# Patient Record
Sex: Female | Born: 1950
Health system: Southern US, Community
[De-identification: ages and names within clinical notes are randomized; demographics above are authoritative.]

## PROBLEM LIST (undated history)

## (undated) DIAGNOSIS — R0600 Dyspnea, unspecified: Secondary | ICD-10-CM

## (undated) DIAGNOSIS — I10 Essential (primary) hypertension: Secondary | ICD-10-CM

## (undated) DIAGNOSIS — K219 Gastro-esophageal reflux disease without esophagitis: Secondary | ICD-10-CM

## (undated) DIAGNOSIS — M1990 Unspecified osteoarthritis, unspecified site: Secondary | ICD-10-CM

## (undated) DIAGNOSIS — F329 Major depressive disorder, single episode, unspecified: Secondary | ICD-10-CM

## (undated) DIAGNOSIS — F419 Anxiety disorder, unspecified: Secondary | ICD-10-CM

## (undated) DIAGNOSIS — F32A Depression, unspecified: Secondary | ICD-10-CM

## (undated) DIAGNOSIS — G8929 Other chronic pain: Secondary | ICD-10-CM

## (undated) DIAGNOSIS — J189 Pneumonia, unspecified organism: Secondary | ICD-10-CM

## (undated) DIAGNOSIS — E039 Hypothyroidism, unspecified: Secondary | ICD-10-CM

## (undated) DIAGNOSIS — R51 Headache: Secondary | ICD-10-CM

## (undated) HISTORY — PX: ACHILLES TENDON REPAIR: SUR1153

## (undated) HISTORY — PX: HERNIA REPAIR: SHX51

## (undated) HISTORY — PX: OTHER SURGICAL HISTORY: SHX169

## (undated) HISTORY — DX: Anxiety disorder, unspecified: F41.9

## (undated) HISTORY — DX: Dyspnea, unspecified: R06.00

## (undated) HISTORY — DX: Unspecified osteoarthritis, unspecified site: M19.90

## (undated) HISTORY — PX: LUMBAR FUSION: SHX111

## (undated) HISTORY — DX: Hypothyroidism, unspecified: E03.9

## (undated) HISTORY — DX: Headache: R51

## (undated) HISTORY — DX: Depression, unspecified: F32.A

## (undated) HISTORY — DX: Major depressive disorder, single episode, unspecified: F32.9

## (undated) HISTORY — PX: TUBAL LIGATION: SHX77

## (undated) HISTORY — PX: LUMBAR LAMINECTOMY: SHX95

## (undated) HISTORY — DX: Essential (primary) hypertension: I10

## (undated) HISTORY — DX: Pneumonia, unspecified organism: J18.9

---

## 1997-08-24 ENCOUNTER — Inpatient Hospital Stay (HOSPITAL_COMMUNITY): Admission: AD | Admit: 1997-08-24 | Discharge: 1997-08-25 | Payer: Self-pay | Admitting: *Deleted

## 1997-12-20 ENCOUNTER — Ambulatory Visit (HOSPITAL_COMMUNITY): Admission: RE | Admit: 1997-12-20 | Discharge: 1997-12-20 | Payer: Self-pay | Admitting: *Deleted

## 1997-12-20 ENCOUNTER — Encounter: Payer: Self-pay | Admitting: *Deleted

## 1998-01-10 ENCOUNTER — Encounter: Payer: Self-pay | Admitting: *Deleted

## 1998-01-10 ENCOUNTER — Ambulatory Visit (HOSPITAL_COMMUNITY): Admission: RE | Admit: 1998-01-10 | Discharge: 1998-01-10 | Payer: Self-pay | Admitting: *Deleted

## 1998-02-01 ENCOUNTER — Encounter: Payer: Self-pay | Admitting: *Deleted

## 1998-02-01 ENCOUNTER — Ambulatory Visit (HOSPITAL_COMMUNITY): Admission: RE | Admit: 1998-02-01 | Discharge: 1998-02-01 | Payer: Self-pay | Admitting: *Deleted

## 1998-03-09 ENCOUNTER — Ambulatory Visit (HOSPITAL_COMMUNITY): Admission: RE | Admit: 1998-03-09 | Discharge: 1998-03-09 | Payer: Self-pay | Admitting: *Deleted

## 1998-03-09 ENCOUNTER — Encounter: Payer: Self-pay | Admitting: *Deleted

## 1998-06-09 ENCOUNTER — Encounter: Payer: Self-pay | Admitting: *Deleted

## 1998-12-25 ENCOUNTER — Encounter: Payer: Self-pay | Admitting: Specialist

## 1998-12-28 ENCOUNTER — Inpatient Hospital Stay (HOSPITAL_COMMUNITY): Admission: RE | Admit: 1998-12-28 | Discharge: 1998-12-29 | Payer: Self-pay | Admitting: Specialist

## 1999-02-01 ENCOUNTER — Other Ambulatory Visit: Admission: RE | Admit: 1999-02-01 | Discharge: 1999-02-01 | Payer: Self-pay | Admitting: *Deleted

## 1999-02-14 ENCOUNTER — Encounter: Payer: Self-pay | Admitting: Specialist

## 1999-02-14 ENCOUNTER — Ambulatory Visit (HOSPITAL_COMMUNITY): Admission: RE | Admit: 1999-02-14 | Discharge: 1999-02-14 | Payer: Self-pay | Admitting: Specialist

## 1999-02-22 ENCOUNTER — Encounter: Payer: Self-pay | Admitting: Specialist

## 1999-02-22 ENCOUNTER — Encounter: Admission: RE | Admit: 1999-02-22 | Discharge: 1999-02-22 | Payer: Self-pay | Admitting: Specialist

## 1999-03-05 ENCOUNTER — Encounter: Admission: RE | Admit: 1999-03-05 | Discharge: 1999-03-05 | Payer: Self-pay | Admitting: Family Medicine

## 1999-03-05 ENCOUNTER — Encounter: Payer: Self-pay | Admitting: Family Medicine

## 2002-05-28 ENCOUNTER — Encounter: Payer: Self-pay | Admitting: Neurosurgery

## 2002-05-28 ENCOUNTER — Encounter: Admission: RE | Admit: 2002-05-28 | Discharge: 2002-05-28 | Payer: Self-pay | Admitting: Neurosurgery

## 2002-06-30 ENCOUNTER — Encounter: Payer: Self-pay | Admitting: Neurosurgery

## 2002-07-02 ENCOUNTER — Inpatient Hospital Stay (HOSPITAL_COMMUNITY): Admission: RE | Admit: 2002-07-02 | Discharge: 2002-07-06 | Payer: Self-pay | Admitting: Neurosurgery

## 2002-07-02 ENCOUNTER — Encounter: Payer: Self-pay | Admitting: Neurosurgery

## 2003-04-05 ENCOUNTER — Ambulatory Visit (HOSPITAL_COMMUNITY): Admission: RE | Admit: 2003-04-05 | Discharge: 2003-04-05 | Payer: Self-pay | Admitting: Neurosurgery

## 2003-04-12 ENCOUNTER — Ambulatory Visit (HOSPITAL_COMMUNITY): Admission: RE | Admit: 2003-04-12 | Discharge: 2003-04-13 | Payer: Self-pay | Admitting: Neurosurgery

## 2003-09-14 ENCOUNTER — Encounter: Payer: Self-pay | Admitting: Neurosurgery

## 2003-09-14 ENCOUNTER — Ambulatory Visit (HOSPITAL_COMMUNITY): Admission: RE | Admit: 2003-09-14 | Discharge: 2003-09-15 | Payer: Self-pay | Admitting: Neurosurgery

## 2003-12-02 ENCOUNTER — Ambulatory Visit (HOSPITAL_COMMUNITY): Admission: RE | Admit: 2003-12-02 | Discharge: 2003-12-02 | Payer: Self-pay | Admitting: Neurosurgery

## 2006-08-01 ENCOUNTER — Encounter: Admission: RE | Admit: 2006-08-01 | Discharge: 2006-08-01 | Payer: Self-pay | Admitting: Neurosurgery

## 2006-08-21 ENCOUNTER — Ambulatory Visit (HOSPITAL_BASED_OUTPATIENT_CLINIC_OR_DEPARTMENT_OTHER): Admission: RE | Admit: 2006-08-21 | Discharge: 2006-08-21 | Payer: Self-pay | Admitting: Orthopedic Surgery

## 2006-08-21 ENCOUNTER — Encounter (INDEPENDENT_AMBULATORY_CARE_PROVIDER_SITE_OTHER): Payer: Self-pay | Admitting: Specialist

## 2007-01-19 ENCOUNTER — Encounter: Admission: RE | Admit: 2007-01-19 | Discharge: 2007-01-19 | Payer: Self-pay | Admitting: Neurosurgery

## 2008-01-11 ENCOUNTER — Encounter: Payer: Self-pay | Admitting: Internal Medicine

## 2008-01-12 ENCOUNTER — Encounter: Payer: Self-pay | Admitting: Pulmonary Disease

## 2008-01-21 ENCOUNTER — Encounter: Payer: Self-pay | Admitting: Pulmonary Disease

## 2008-02-25 ENCOUNTER — Ambulatory Visit: Payer: Self-pay | Admitting: Internal Medicine

## 2008-02-25 DIAGNOSIS — J189 Pneumonia, unspecified organism: Secondary | ICD-10-CM

## 2008-02-26 DIAGNOSIS — M1990 Unspecified osteoarthritis, unspecified site: Secondary | ICD-10-CM | POA: Insufficient documentation

## 2008-02-26 DIAGNOSIS — K469 Unspecified abdominal hernia without obstruction or gangrene: Secondary | ICD-10-CM | POA: Insufficient documentation

## 2008-02-26 DIAGNOSIS — E039 Hypothyroidism, unspecified: Secondary | ICD-10-CM | POA: Insufficient documentation

## 2008-02-26 DIAGNOSIS — G894 Chronic pain syndrome: Secondary | ICD-10-CM | POA: Insufficient documentation

## 2008-02-26 DIAGNOSIS — Z9889 Other specified postprocedural states: Secondary | ICD-10-CM

## 2008-02-26 DIAGNOSIS — Z87891 Personal history of nicotine dependence: Secondary | ICD-10-CM | POA: Insufficient documentation

## 2008-02-26 DIAGNOSIS — R51 Headache: Secondary | ICD-10-CM

## 2008-02-26 DIAGNOSIS — I10 Essential (primary) hypertension: Secondary | ICD-10-CM | POA: Insufficient documentation

## 2008-02-26 DIAGNOSIS — F329 Major depressive disorder, single episode, unspecified: Secondary | ICD-10-CM | POA: Insufficient documentation

## 2008-02-26 DIAGNOSIS — F411 Generalized anxiety disorder: Secondary | ICD-10-CM | POA: Insufficient documentation

## 2008-02-26 DIAGNOSIS — R32 Unspecified urinary incontinence: Secondary | ICD-10-CM

## 2008-02-26 DIAGNOSIS — R197 Diarrhea, unspecified: Secondary | ICD-10-CM | POA: Insufficient documentation

## 2008-02-26 DIAGNOSIS — N390 Urinary tract infection, site not specified: Secondary | ICD-10-CM | POA: Insufficient documentation

## 2008-02-26 DIAGNOSIS — R519 Headache, unspecified: Secondary | ICD-10-CM | POA: Insufficient documentation

## 2008-02-29 ENCOUNTER — Telehealth (INDEPENDENT_AMBULATORY_CARE_PROVIDER_SITE_OTHER): Payer: Self-pay | Admitting: *Deleted

## 2008-04-25 ENCOUNTER — Encounter: Payer: Self-pay | Admitting: Critical Care Medicine

## 2008-05-02 ENCOUNTER — Encounter: Payer: Self-pay | Admitting: Internal Medicine

## 2008-05-12 ENCOUNTER — Telehealth (INDEPENDENT_AMBULATORY_CARE_PROVIDER_SITE_OTHER): Payer: Self-pay | Admitting: *Deleted

## 2008-05-16 ENCOUNTER — Ambulatory Visit: Payer: Self-pay | Admitting: Internal Medicine

## 2008-05-16 DIAGNOSIS — J82 Pulmonary eosinophilia, not elsewhere classified: Secondary | ICD-10-CM

## 2008-05-16 DIAGNOSIS — R05 Cough: Secondary | ICD-10-CM

## 2008-05-17 ENCOUNTER — Ambulatory Visit: Payer: Self-pay | Admitting: Internal Medicine

## 2008-05-17 ENCOUNTER — Telehealth (INDEPENDENT_AMBULATORY_CARE_PROVIDER_SITE_OTHER): Payer: Self-pay | Admitting: *Deleted

## 2008-06-13 ENCOUNTER — Ambulatory Visit: Payer: Self-pay | Admitting: Internal Medicine

## 2008-06-15 ENCOUNTER — Ambulatory Visit: Payer: Self-pay | Admitting: Internal Medicine

## 2008-06-16 ENCOUNTER — Telehealth (INDEPENDENT_AMBULATORY_CARE_PROVIDER_SITE_OTHER): Payer: Self-pay | Admitting: *Deleted

## 2008-06-30 ENCOUNTER — Ambulatory Visit: Payer: Self-pay | Admitting: Internal Medicine

## 2008-07-01 LAB — CONVERTED CEMR LAB
ALT: 13 units/L (ref 0–35)
AST: 20 units/L (ref 0–37)
Albumin: 4.5 g/dL (ref 3.5–5.2)
Alkaline Phosphatase: 59 units/L (ref 39–117)
BUN: 9 mg/dL (ref 6–23)
Basophils Relative: 0.1 % (ref 0.0–3.0)
CO2: 33 meq/L — ABNORMAL HIGH (ref 19–32)
Chloride: 100 meq/L (ref 96–112)
Creatinine, Ser: 0.6 mg/dL (ref 0.4–1.2)
Eosinophils Absolute: 0.3 10*3/uL (ref 0.0–0.7)
Eosinophils Relative: 4.4 % (ref 0.0–5.0)
GFR calc non Af Amer: 110 mL/min
Hemoglobin, Urine: NEGATIVE
Leukocytes, UA: NEGATIVE
Lymphocytes Relative: 19.7 % (ref 12.0–46.0)
MCV: 95.4 fL (ref 78.0–100.0)
Neutrophils Relative %: 68.5 % (ref 43.0–77.0)
Nitrite: NEGATIVE
Platelets: 283 10*3/uL (ref 150–400)
Potassium: 4.6 meq/L (ref 3.5–5.1)
RBC: 4.05 M/uL (ref 3.87–5.11)
Specific Gravity, Urine: <=1.005 (ref 1.000–1.035)
Total Bilirubin: 0.8 mg/dL (ref 0.3–1.2)
Urobilinogen, UA: 0.2 (ref 0.0–1.0)
WBC: 6.9 10*3/uL (ref 4.5–10.5)

## 2008-07-13 ENCOUNTER — Telehealth (INDEPENDENT_AMBULATORY_CARE_PROVIDER_SITE_OTHER): Payer: Self-pay | Admitting: *Deleted

## 2008-07-15 ENCOUNTER — Ambulatory Visit: Payer: Self-pay | Admitting: Cardiology

## 2008-07-15 ENCOUNTER — Ambulatory Visit: Payer: Self-pay | Admitting: Internal Medicine

## 2008-10-17 ENCOUNTER — Ambulatory Visit: Payer: Self-pay | Admitting: Internal Medicine

## 2008-11-04 ENCOUNTER — Ambulatory Visit: Payer: Self-pay | Admitting: Internal Medicine

## 2009-01-04 ENCOUNTER — Encounter: Payer: Self-pay | Admitting: Internal Medicine

## 2009-02-03 ENCOUNTER — Ambulatory Visit: Payer: Self-pay | Admitting: Critical Care Medicine

## 2009-02-03 DIAGNOSIS — J209 Acute bronchitis, unspecified: Secondary | ICD-10-CM | POA: Insufficient documentation

## 2009-02-03 LAB — CONVERTED CEMR LAB
Basophils Absolute: 0.1 10*3/uL (ref 0.0–0.1)
Basophils Relative: 0.9 % (ref 0.0–3.0)
Eosinophils Absolute: 0.1 10*3/uL (ref 0.0–0.7)
Hemoglobin: 12.4 g/dL (ref 12.0–15.0)
Lymphs Abs: 0.9 10*3/uL (ref 0.7–4.0)
MCHC: 35 g/dL (ref 30.0–36.0)
MCV: 97.4 fL (ref 78.0–100.0)
Monocytes Absolute: 0.6 10*3/uL (ref 0.1–1.0)
Neutro Abs: 10.5 10*3/uL — ABNORMAL HIGH (ref 1.4–7.7)
RBC: 3.64 M/uL — ABNORMAL LOW (ref 3.87–5.11)
RDW: 11.4 % — ABNORMAL LOW (ref 11.5–14.6)

## 2009-02-06 ENCOUNTER — Telehealth: Payer: Self-pay | Admitting: Critical Care Medicine

## 2009-02-08 ENCOUNTER — Ambulatory Visit: Payer: Self-pay | Admitting: Internal Medicine

## 2009-02-22 ENCOUNTER — Ambulatory Visit: Payer: Self-pay | Admitting: Pulmonary Disease

## 2009-02-22 LAB — CONVERTED CEMR LAB
BUN: 11 mg/dL (ref 6–23)
Chloride: 94 meq/L — ABNORMAL LOW (ref 96–112)
Creatinine, Ser: 0.8 mg/dL (ref 0.4–1.2)
GFR calc non Af Amer: 78.62 mL/min (ref >60)
Glucose, Bld: 90 mg/dL (ref 70–99)
Rhuematoid fact SerPl-aCnc: 22.2 intl units/mL — ABNORMAL HIGH (ref 0.0–20.0)

## 2009-02-28 ENCOUNTER — Encounter: Payer: Self-pay | Admitting: Pulmonary Disease

## 2009-02-28 ENCOUNTER — Ambulatory Visit (HOSPITAL_COMMUNITY): Admission: RE | Admit: 2009-02-28 | Discharge: 2009-02-28 | Payer: Self-pay | Admitting: Pulmonary Disease

## 2009-03-02 ENCOUNTER — Ambulatory Visit: Payer: Self-pay | Admitting: Internal Medicine

## 2009-03-08 ENCOUNTER — Telehealth (INDEPENDENT_AMBULATORY_CARE_PROVIDER_SITE_OTHER): Payer: Self-pay | Admitting: *Deleted

## 2009-03-08 LAB — CONVERTED CEMR LAB
Anti Nuclear Antibody(ANA): NEGATIVE
IgG (Immunoglobin G), Serum: 1010 mg/dL (ref 694–1618)

## 2009-03-21 ENCOUNTER — Ambulatory Visit: Payer: Self-pay | Admitting: Pulmonary Disease

## 2009-03-24 ENCOUNTER — Ambulatory Visit: Admission: RE | Admit: 2009-03-24 | Discharge: 2009-03-24 | Payer: Self-pay | Admitting: Pulmonary Disease

## 2009-03-24 ENCOUNTER — Ambulatory Visit: Payer: Self-pay | Admitting: Pulmonary Disease

## 2009-04-05 ENCOUNTER — Ambulatory Visit: Payer: Self-pay | Admitting: Pulmonary Disease

## 2009-04-18 ENCOUNTER — Ambulatory Visit: Payer: Self-pay | Admitting: Infectious Disease

## 2009-04-18 DIAGNOSIS — J329 Chronic sinusitis, unspecified: Secondary | ICD-10-CM | POA: Insufficient documentation

## 2009-04-18 LAB — CONVERTED CEMR LAB
Albumin: 3.9 g/dL (ref 3.5–5.2)
Alkaline Phosphatase: 83 units/L (ref 39–117)
BUN: 18 mg/dL (ref 6–23)
CO2: 28 meq/L (ref 19–32)
Calcium: 9.1 mg/dL (ref 8.4–10.5)
Chloride: 99 meq/L (ref 96–112)
Eosinophils Absolute: 0.3 10*3/uL (ref 0.0–0.7)
Eosinophils Relative: 5 % (ref 0–5)
Glucose, Bld: 72 mg/dL (ref 70–99)
HCT: 34.5 % — ABNORMAL LOW (ref 36.0–46.0)
Hemoglobin: 12.1 g/dL (ref 12.0–15.0)
IgA: 99 mg/dL (ref 68–378)
IgE (Immunoglobulin E), Serum: 206.2 intl units/mL — ABNORMAL HIGH (ref 0.0–180.0)
IgG (Immunoglobin G), Serum: 1100 mg/dL (ref 694–1618)
Lymphocytes Relative: 23 % (ref 12–46)
Lymphs Abs: 1.4 10*3/uL (ref 0.7–4.0)
MCV: 96 fL (ref >=78.0)
Monocytes Absolute: 0.6 10*3/uL (ref 0.1–1.0)
Platelets: 304 10*3/uL (ref 150–400)
Potassium: 5.5 meq/L — ABNORMAL HIGH (ref 3.5–5.3)
Sodium: 134 meq/L — ABNORMAL LOW (ref 135–145)
Total Protein: 6.2 g/dL (ref 6.0–8.3)
WBC: 6 10*3/uL (ref 4.0–10.5)

## 2009-04-26 ENCOUNTER — Encounter: Payer: Self-pay | Admitting: Infectious Disease

## 2009-04-27 ENCOUNTER — Telehealth: Payer: Self-pay | Admitting: Pulmonary Disease

## 2009-05-22 ENCOUNTER — Ambulatory Visit: Payer: Self-pay | Admitting: Infectious Disease

## 2009-05-23 ENCOUNTER — Encounter: Payer: Self-pay | Admitting: Infectious Disease

## 2009-06-05 ENCOUNTER — Ambulatory Visit: Payer: Self-pay | Admitting: Pulmonary Disease

## 2009-06-14 ENCOUNTER — Encounter: Payer: Self-pay | Admitting: Pulmonary Disease

## 2009-06-14 ENCOUNTER — Ambulatory Visit: Payer: Self-pay | Admitting: Pulmonary Disease

## 2009-06-29 ENCOUNTER — Ambulatory Visit: Payer: Self-pay | Admitting: Infectious Disease

## 2009-08-01 ENCOUNTER — Telehealth: Payer: Self-pay | Admitting: Infectious Disease

## 2009-08-02 ENCOUNTER — Encounter: Payer: Self-pay | Admitting: Infectious Disease

## 2009-08-21 ENCOUNTER — Ambulatory Visit: Payer: Self-pay | Admitting: Pulmonary Disease

## 2009-08-22 ENCOUNTER — Telehealth (INDEPENDENT_AMBULATORY_CARE_PROVIDER_SITE_OTHER): Payer: Self-pay | Admitting: *Deleted

## 2009-08-22 ENCOUNTER — Encounter: Payer: Self-pay | Admitting: Adult Health

## 2009-09-06 ENCOUNTER — Encounter: Payer: Self-pay | Admitting: Pulmonary Disease

## 2009-09-06 ENCOUNTER — Ambulatory Visit: Payer: Self-pay | Admitting: Pulmonary Disease

## 2009-09-06 ENCOUNTER — Ambulatory Visit: Payer: Self-pay | Admitting: Emergency Medicine

## 2009-09-06 ENCOUNTER — Inpatient Hospital Stay (HOSPITAL_COMMUNITY): Admission: AD | Admit: 2009-09-06 | Discharge: 2009-09-11 | Payer: Self-pay | Admitting: Pulmonary Disease

## 2009-09-07 ENCOUNTER — Telehealth (INDEPENDENT_AMBULATORY_CARE_PROVIDER_SITE_OTHER): Payer: Self-pay | Admitting: *Deleted

## 2009-09-13 ENCOUNTER — Telehealth (INDEPENDENT_AMBULATORY_CARE_PROVIDER_SITE_OTHER): Payer: Self-pay | Admitting: *Deleted

## 2009-09-13 ENCOUNTER — Telehealth: Payer: Self-pay | Admitting: Pulmonary Disease

## 2009-09-15 ENCOUNTER — Ambulatory Visit: Payer: Self-pay | Admitting: Pulmonary Disease

## 2009-09-15 ENCOUNTER — Encounter: Payer: Self-pay | Admitting: Pulmonary Disease

## 2009-09-18 ENCOUNTER — Telehealth: Payer: Self-pay | Admitting: Pulmonary Disease

## 2009-09-19 ENCOUNTER — Encounter: Payer: Self-pay | Admitting: Pulmonary Disease

## 2009-09-21 ENCOUNTER — Telehealth (INDEPENDENT_AMBULATORY_CARE_PROVIDER_SITE_OTHER): Payer: Self-pay | Admitting: *Deleted

## 2009-09-26 ENCOUNTER — Telehealth (INDEPENDENT_AMBULATORY_CARE_PROVIDER_SITE_OTHER): Payer: Self-pay | Admitting: *Deleted

## 2009-09-26 ENCOUNTER — Telehealth: Payer: Self-pay | Admitting: Pulmonary Disease

## 2009-09-27 ENCOUNTER — Telehealth (INDEPENDENT_AMBULATORY_CARE_PROVIDER_SITE_OTHER): Payer: Self-pay | Admitting: *Deleted

## 2009-10-10 ENCOUNTER — Ambulatory Visit: Payer: Self-pay | Admitting: Pulmonary Disease

## 2009-10-11 ENCOUNTER — Telehealth (INDEPENDENT_AMBULATORY_CARE_PROVIDER_SITE_OTHER): Payer: Self-pay | Admitting: *Deleted

## 2009-10-19 ENCOUNTER — Telehealth (INDEPENDENT_AMBULATORY_CARE_PROVIDER_SITE_OTHER): Payer: Self-pay | Admitting: *Deleted

## 2009-11-01 ENCOUNTER — Encounter: Payer: Self-pay | Admitting: Pulmonary Disease

## 2009-11-20 ENCOUNTER — Telehealth (INDEPENDENT_AMBULATORY_CARE_PROVIDER_SITE_OTHER): Payer: Self-pay | Admitting: *Deleted

## 2009-11-20 ENCOUNTER — Ambulatory Visit: Payer: Self-pay | Admitting: Pulmonary Disease

## 2009-11-27 ENCOUNTER — Encounter: Payer: Self-pay | Admitting: Pulmonary Disease

## 2010-05-29 NOTE — Progress Notes (Signed)
Summary: 02  Phone Note Call from Patient Call back at Home Phone [phone]   Caller: Patient Call For: Vassie Loll Reason for Call: Talk to Nurse Summary of Call: pt on homecare, has portable 02 - (weighs about 25lbs).  Can she get a smaller, more portable tank ordered? Initial call taken by: Eugene Gavia,  Sep 13, 2009 8:45 AM  Follow-up for Phone Call        pt is requesting an order be palced for portable oxygen. pt uses [facility]. please advise if ok to place order. Carron Curie CMA  Sep 13, 2009 10:19 AM   done  Follow-up by: Comer Locket. Vassie Loll MD,  Sep 14, 2009 1:22 PM  Additional Follow-up for Phone Call Additional follow up Details #1::        Spoke with pt and notified that order has been sent. Additional Follow-up by: Vernie Murders,  Sep 14, 2009 1:50 PM

## 2010-05-29 NOTE — Progress Notes (Signed)
Summary: hosp pt  Phone Note Call from Patient   Caller: Patient Call For: alva Reason for Call: Talk to Nurse Summary of Call: pt in [facility] [id number] - pt says there is a discrepancy with her Prozac, Morphine and Endocet.  Would like for Jessica R or Dr. Vassie Loll to review this and call with the correct dosage.  Says they are giving her old dosages, which are not helping. Initial call taken by: Eugene Gavia,  Sep 07, 2009 8:08 AM  Follow-up for Phone Call        Spoke with nurse assigned to pt in hosp.  Instructed her to page the [phone] pager and talk with MD or NP who is covering pt. Abigail Miyamoto RN  Sep 07, 2009 10:13 AM

## 2010-05-29 NOTE — Progress Notes (Signed)
Summary: talk to nurse  Phone Note Call from Patient Call back at Home Phone [phone]   Caller: Patient Call For: parrett Summary of Call: States that [facility] needs an order to have her O2 picked up. Initial call taken by: Darletta Moll,  October 11, 2009 11:35 AM  Follow-up for Phone Call        Spoke with pt.  Pt states she is not using [facility] instead o2 is with [facility].  Adivse her I would put in order to [facility] to d/c o2.  Pt also requesting results of CXR done yesterday.  Adivse her of results and recs as stated in append on 10/10/09 by TP.  She verbalized understanding.  Gweneth Dimitri RN  October 11, 2009 11:44 AM

## 2010-05-29 NOTE — Progress Notes (Signed)
Summary: phone note-TY  Phone Note Call from Patient   Caller: Patient Call For: Courtney Blanch Dam MD Summary of Call: Patient states that she has been sick with pneomonia, and very bad back pain. She would like [place] to fax lab orders to her pcp Dr. Carolyne Fiscal so she can have her blood drawn there for convenience. She also wants to cancel her appt with you on 08/07/2009 and resched after her results come back.  Initial call taken by: Starleen Arms, CMA  Follow-up for Phone Call        [name] can you have her PCP or whomever is taking care of her call me. Its MORE than awkward to simply be sending faxes with orders to their office. Thanks, Kees Follow-up by: Acey Lav MD,  August 01, 2009 2:18 PM  Additional Follow-up for Phone Call Additional follow up Details #1::        I called and spoke with his nurse and she said that they do labs for other providers all the time. She stated that it was appropriate to do that. I suggested that the pcp gave you a call, but she said it was ok. The number is [phone] in [place] and [phone] in [place]. Her PCP is in [place] today. Additional Follow-up by: Starleen Arms CMA,  August 04, 2009 11:20 AM    Additional Follow-up for Phone Call Additional follow up Details #2::    I spoke with her PCP on Friday and I have not labs needed to workup this specific pneumonia, but we had considered doing the full gene sequencing for Cystic fibrosis before. It was going to be expensive for the pt to do that test and she declined. Follow-up by: Acey Lav MD,  August 08, 2009 3:46 PM

## 2010-05-29 NOTE — Assessment & Plan Note (Signed)
Summary: 1 MONTH RECHECK/CH   Visit Type:  Follow-up Referring Provider:  Dr. Joetta Manners Primary Provider:  Dr. Joetta Manners  CC:  1 month follow up.  History of Present Illness: 60yo lady who developed a severe flulike illness in the fall of 2008.  She says it took her several months to recover.  She was treated for acute bronchitis in the spring the with Zithromax. She was given a dx of COPD at that time (which she feels is a mis-diagnosis)  She developed chest pain and shortness of breath in late June 2009, and on  CT found to have a  large right loculated pleural effusion.  She was admitted to [facility] 10/28/07.  A chest tube  placed on 7/ 2/09 revealing a large amount of" chocolate milk" fluid.  The culture of that fluid grew Streptococcus intermedius.  She was treated with IV Zosyn while hospitalized, and t discharged home on November 05 2007 to complete a short course of oral Augmentin and Levaquin. After recovery she then felt poorly again  was seen by Dr. Blenda Nicely. She was told that she had recurrent pneumonia. Repeat chest CT scan from November 28, 2007 showed  new right-sided infiltrates.  She was treated again with Levaquin and improved.  She got worse again in early September, 2009 and was readmitted to the hospital on September 15 with shortness of breath.  a sputum culture on September 15 grew Klebsiella pneumoniae. A CT scan at that time showed that the right-sided infiltrates and small mediastinal and hilar nodes had improved. She had , bscopy non diagnostic incl afb, fungal,  She had further documented recurrences in 12/09 levaquin), 1/10, 2/10 (cipro), 6/10 (cipro) 10/10 (cipro). She had repeat CT this fall 2010 and it again showed recurrence of right sided infiltrates. She underwent bronchoscopy with BAL and biopsies by Dr. Vassie Loll in late November. HE encoutered minimal white phlegm in lower left airways and did BAL and biopsies from RLL and RML. BAL grew >100K pseudomonas aeruginosa S to all anti  pseudomonal abx (including cipro). Biospy showed some antrocotic pigment and granulamotous inflammation. I saw her in the ID clinic last month and completed further testing on her--though some of my ordered tests were mishandled by the [facility] specimen processing center. Labs so far pertinent for normal IgG, slightly elevated IgE, low IgM, normal CH50, negative ANCA. CF labs were not sent, nor were DHR (neutrophil burst assay) nor where antibodies to tetanus and pnemococcus. Since being seen her last she again had recurrence of fevers, cough, chills and was trated with cipro 500mg  twice daily for 15 days. She felt much better but then shortly after stopping this therapy woke up again with  with severe shaking, temp of 100, headache, backache, took cipro 750mg  took for 10 days with improvement in ssx. She states that she had a procedue done at [facility] to "burn the nerves in my back," and she is trying to reduce her need for pain medications.  Depression History:      The patient denies a depressed mood most of the day and a diminished interest in her usual daily activities.         Preventive Screening-Counseling & Management  Alcohol-Tobacco     Alcohol drinks/day: 0     Smoking Status: never     Year Quit: 2006  Caffeine-Diet-Exercise     Caffeine use/day: coffee     Does Patient Exercise: yes     Type of exercise: rehab  Exercise (avg: min/session): <30     Times/week: 7  Safety-Violence-Falls     Seat Belt Use: yes   Current Allergies (reviewed today): ! * OXYCONTIN Past History:  Past Medical History: Anxiety Depression Headache Hypertension Hypothyroidism Osteoarthritis Urinary incontinence Dyspnea     - PFT's completely nl 06/15/2008 Recurrent pneumonias  after empyema 7/09 ([place])       - Chest CT [place]:  resolving air space dz rul, rll and shrinking nodes 01/11/08 vs 12/08/07       - Sinus CT neg July 15, 2008  CT 11/4/210 sinuses without acute disease CT""  LUng with bilateral airspace disease predominantly involving the RLL Sp BAL and biopsies on Bronchoscopy with >100K pseudomonas and bx with anthracosis and chronic granulatmous infection  Past Surgical History: Reviewed history from 04/18/2009 and no changes required. achilles tendon repair Lumbar laminectomy Lumbar fusion Tubal ligation Nerve stimulator insertion and subsequent removal Lumbar hernia repair 01/04/09 [facility]  Family History: Reviewed history from 10/17/2008 and no changes required. Breast CA- Mother Heart dz- Father neg resp dz  Social History: Reviewed history from 04/18/2009 and no changes required. disabled, 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 Former smoker.  Quit in 2006.  Smoked for approx 15 years up to 1 ppd.MarriedChildren. No exposure to dusts aerosols. LIves in [place] house in [place]. They have two pet dogs.  Review of Systems       The patient complains of difficulty walking and depression.  The patient denies anorexia, fever, weight loss, weight gain, vision loss, decreased hearing, hoarseness, chest pain, syncope, dyspnea on exertion, peripheral edema, prolonged cough, headaches, hemoptysis, abdominal pain, melena, hematochezia, severe indigestion/heartburn, hematuria, incontinence, genital sores, muscle weakness, suspicious skin lesions, transient blindness, unusual weight change, abnormal bleeding, and enlarged lymph nodes.    Vital Signs:  Patient profile:   60 year old female Height:      65.5 inches (166.37 cm) Weight:      128.9 pounds (58.59 kg) BMI:     21.20 Temp:     97.8 degrees F (36.56 degrees C) oral Pulse rate:   62 / minute BP sitting:   155 / 84  (left arm)  Vitals Entered By: Baxter Hire) (May 22, 2009 9:04 AM) CC: 1 month follow up Is Patient Diabetic? No Pain Assessment Patient in pain? yes     Location: upper and lower back Intensity: 7 Type: burning Onset of pain   Constant Nutritional Status BMI of 19 -24 = normal Nutritional Status Detail appetite is good per patient  Does patient need assistance? Functional Status Self care Ambulation Normal   Physical Exam  General:  alert.  thin anxious sitting upright through most of the interview Head:  normocephalic, atraumatic, and no abnormalities observed.  no maxillary sinus tenderness Eyes:  vision grossly intact, pupils equal, pupils round, and pupils reactive to light.   Ears:  no external deformities and ear piercing(s) noted.   Nose:  no external erythema, no nasal discharge, and no mucosal edema.   Mouth:  pharynx pink and moist, no erythema, and no exudates.   Lungs:  slightly prolonged expiratory phase without wheezes rhonchi or rales Heart:  normal rate, regular rhythm, no murmur, and no gallop.   Abdomen:  soft, non-tender, and no distention.   Msk:  normal ROM.   Neurologic:  alert & oriented X3, strength normal in all extremities, and gait normal.   Skin:  turgor normal and no rashes.  Psych:  Oriented X3, memory intact for recent and remote, normally interactive,  and moderately anxious.     Impression & Recommendations:  Problem # 1:  PNEUMONIA, RECURRENT (ICD-486)  Given that her Immunoglobulin G levels were normal CVID would seem unlikely. I DO plan on challenging her with pneumovax and tetanus and then rechecking her postvacciantion titers for comparison with baseline titers today. I will send off CF labs in case she has unusual presentationg with rare mutation. I am checking DHR test though this presentation does not fit well with CGD. The elevated IgE is not especially high and I cannot find a way to foce Jobs syndrom onto this pts clinical picture. I do wonder if she may have some degree of bronchiectasis and the recovery of pseudomonas and her repeated response to cipro, makes this seem clinically like a viable posibility though radiographically she does nothave evidence for this  and my understangin is that Dr. Vassie Loll did not find much on bronchoscopy to suggest this. I will in the interim check labs as above and will email Verdene Rio at the [facility] to see what furrther guidance I can get on this intriguing pt.  Orders: Est. Patient Level IV (16606)  Problem # 2:  CHRONIC PAIN SYNDROME (ICD-338.4)  sill on some opoid agonsist but trying to minimize these  Orders: Est. Patient Level IV (30160)  Problem # 3:  COPD (ICD-496)  She disputes this diagnosis. I dont know if PFts were done to prove or weakent his argument. Will cc back to Dr. Vassie Loll of course. Her updated medication list for this problem includes:    Proventil Hfa 108 (90 Base) Mcg/act Aers (Albuterol sulfate) .[name] Kitchen... 2 puffs every 4 hours as needed  Orders: Est. Patient Level IV (10932)  Other Orders: T- * Misc. Laboratory test [phone]) T- * Misc. Laboratory test [phone]) T- * Misc. Laboratory test [phone]) T- * Misc. Laboratory test [phone])  Patient Instructions: 1)  rtc to see Dr. Daiva Eves in one month  Process Orders Check Orders Results:     [facility]: Order checked:     [phone] -- T- * Misc. Laboratory test -- No CPT codes found (CPT: ) Tests Sent for requisitioning (May 22, 2009 11:12 PM):     05/22/2009: [facility] -- T- * Misc. Laboratory test [phone] (signed)     05/22/2009: [facility] -- T- * Misc. Laboratory test [phone] (signed)     05/22/2009: [facility] -- T- * Misc. Laboratory test [phone] (signed)     05/22/2009: [facility] -- T- * Misc. Laboratory test [phone] (signed)

## 2010-05-29 NOTE — Miscellaneous (Signed)
Summary: Orders Update  Clinical Lists Changes  Problems: Removed problem of COPD (ICD-496)

## 2010-05-29 NOTE — Miscellaneous (Signed)
Summary: Orders Update  Clinical Lists Changes  Orders: Added new Test order of T-2 View CXR ([id number]) - Signed 

## 2010-05-29 NOTE — Progress Notes (Signed)
Summary: waiting on order  Phone Note Call from Patient Call back at Home Phone [phone]   Caller: Patient Call For: alva Summary of Call: pt says that [facility] has not yet received an order for repeat cxr.  Initial call taken by: Tivis Ringer, CNA,  Sep 21, 2009 2:57 PM  Follow-up for Phone Call        faxed order to [phone] but out pt says they do not have it i have called them to send me another [phone] faxed  Follow-up by: Oneita Jolly,  Sep 21, 2009 5:20 PM

## 2010-05-29 NOTE — Assessment & Plan Note (Signed)
Summary: NP follow up - PNA   Copy to:  Dr. Joetta Manners Primary Provider/Referring Provider:  Dr. Joetta Manners  CC:  3 week follow recurrent PNA - states breathing is doing well but still having PND with clear mucus.  History of Present Illness: 60 yowf quit smoking 2006,  for FU of  recurrent  Rt sided pneumonias starting 2008.   August 21, 2009--Presents for an acute office visit. Complains of acute onset of fever/chills, increased SOB, prod cough with green mucus, weakness, HA, nausea onset last night.   pt finished round of cefdinir given by PCP 10 days  , and began cipro    Sep 06, 2009 2:15 PM  Improved , then got worse again, now c/o rt chest pain, worsening dyspnea, desaturated to 80% in office today. CXR shows worsening rt infiltrates. Morphine dose has been increased, surgical procedure deferred .  Sep 15, 2009 2:10 PM  hospitalised 5/11-16 for extensive rt pneumonia & hypoxia - IV cefepime & vanc via PICC x 14 ds + Po cipro . Discussed high morphine dosing with dr Cherylann Banas --& xanax dosing with dr Raynaldo Opitz. Have informed dr Carolyne Fiscal of present situation. swallow study showed intermittent penetration of thin and nectar thick liquids. speech therapy recomm - eat and drink only when alert, seat at upright at 90 degrees, small bites and sips and intermittently clear her throat post swallowing.  O2 helps, cxr today shows persistent rt infx extensive   October 10, 2009 --Presents for 3 week follow recurrent PNA - states breathing is doing well but still having PND with clear mucus. She has finished cefepime and vanc for 14 days. She had a chest xray few weeks ago at [facility] -we will obtain copy of this. Her PICC line was removed couple of weeks ago. She is eating better, weight is up 8 lbs.  She is getting stronger, is trying to decrease pain meds. Denies chest pain,  orthopnea, hemoptysis, fever, n/v/d, edema, headache. She is no longer wearing O2.   Preventive Screening-Counseling &  Management  Alcohol-Tobacco     Smoking Status: quit  Medications Prior to Update: 1)  Bystolic 5 Mg Tabs (Nebivolol Hcl) .... 1/2 Once Daily 2)  Bayer Aspirin 325 Mg Tabs (Aspirin) .... One Daily 3)  Baclofen 20 Mg Tabs (Baclofen) .[name] Kitchen.. 1 Every 8 Hours 4)  Amitriptyline Hcl 25 Mg Tabs (Amitriptyline Hcl) .[name] Kitchen.. 1 Once Daily 5)  Levothroid 112 Mcg Tabs (Levothyroxine Sodium) .... Once Daily 6)  Mirtazapine 45 Mg Tabs (Mirtazapine) .[name] Kitchen.. 1 Once Daily 7)  Gabapentin 600 Mg Tabs (Gabapentin) .[name] Kitchen.. 1 Every 8 Hours 8)  Alprazolam 1 Mg Tabs (Alprazolam) .[name] Kitchen.. 1 Four Times A Day As Needed 9)  Vesicare 5 Mg Tabs (Solifenacin Succinate) .[name] Kitchen.. 1 Once Daily 10)  Endocet 10-325 Mg Tabs (Oxycodone-Acetaminophen) .... Take 1 To 2  Tablet By Mouth Three Times A Day 11)  Naftin 1 % Gel (Naftifine Hcl) .... As Directed 12)  Proventil Hfa 108 (90 Base) Mcg/act Aers (Albuterol Sulfate) .... 2 Puffs Every 4 Hours As Needed 13)  Mucinex Maximum Strength 1200 Mg Xr12h-Tab (Guaifenesin) .... Take 1 Tablet By Mouth Two Times A Day 14)  Morphine Sulfate Cr 30 Mg Xr12h-Tab (Morphine Sulfate) .... As Directed (100mg  Every 6 Hours) 15)  Trimethoprm 100 Mg .[name] KitchenMarland Kitchen. 1 At Bedtime 16)  Cipro 750 Mg Tabs (Ciprofloxacin Hcl) .... Take 1 Tablet By Mouth Two Times A Day 17)  Tylenol Extra Strength 500 Mg Tabs (Acetaminophen) .... Take 1 Tablet  By Mouth Every4 To 6 Hours For Fever As Needed  Current Medications (verified): 1)  Bystolic 5 Mg Tabs (Nebivolol Hcl) .... 1/2 Once Daily 2)  Bayer Aspirin 325 Mg Tabs (Aspirin) .... One Daily 3)  Baclofen 20 Mg Tabs (Baclofen) .[name] Kitchen.. 1 Every 8 Hours 4)  Amitriptyline Hcl 25 Mg Tabs (Amitriptyline Hcl) .[name] Kitchen.. 1 Once Daily 5)  Levothroid 112 Mcg Tabs (Levothyroxine Sodium) .... Once Daily 6)  Mirtazapine 45 Mg Tabs (Mirtazapine) .[name] Kitchen.. 1 Once Daily 7)  Gabapentin 600 Mg Tabs (Gabapentin) .[name] Kitchen.. 1 Every 8 Hours 8)  Alprazolam 1 Mg Tabs (Alprazolam) .... 1/2 Tab By Mouth Three Times A Day As  Needed 9)  Vesicare 5 Mg Tabs (Solifenacin Succinate) .[name] Kitchen.. 1 Once Daily 10)  Endocet 10-325 Mg Tabs (Oxycodone-Acetaminophen) .... Take 1 To 2  Tablet By Mouth Three Times A Day 11)  Naftin 1 % Gel (Naftifine Hcl) .... As Directed 12)  Proventil Hfa 108 (90 Base) Mcg/act Aers (Albuterol Sulfate) .... 2 Puffs Every 4 Hours As Needed 13)  Mucinex Maximum Strength 1200 Mg Xr12h-Tab (Guaifenesin) .... Take 1 Tablet By Mouth Two Times A Day 14)  Morphine Sulfate Cr 30 Mg Xr12h-Tab (Morphine Sulfate) .... 2 Tabs By Mouth Every 8 Hours As Needed 15)  Trimethoprim 100 Mg Tabs (Trimethoprim) .... Take 1 Tab By Mouth At Bedtime 16)  Cipro 750 Mg Tabs (Ciprofloxacin Hcl) .... Take 1 Tablet By Mouth Two Times A Day 17)  Tylenol Extra Strength 500 Mg Tabs (Acetaminophen) .... Take 1 Tablet By Mouth Every4 To 6 Hours For Fever As Needed  Allergies (verified): 1)  ! * Oxycontin  Past History:  Past Medical History: Last updated: 05/22/2009 Anxiety Depression Headache Hypertension Hypothyroidism Osteoarthritis Urinary incontinence Dyspnea     - PFT's completely nl 06/15/2008 Recurrent pneumonias  after empyema 7/09 ([place])       - Chest CT [facility]:  resolving air space dz rul, rll and shrinking nodes 01/11/08 vs 12/08/07       - Sinus CT neg July 15, 2008  CT 11/4/210 sinuses without acute disease CT"" LUng with bilateral airspace disease predominantly involving the RLL Sp BAL and biopsies on Bronchoscopy with >100K pseudomonas and bx with anthracosis and chronic granulatmous infection  Past Surgical History: Last updated: 04/18/2009 achilles tendon repair Lumbar laminectomy Lumbar fusion Tubal ligation Nerve stimulator insertion and subsequent removal Lumbar hernia repair 01/04/09 [facility]  Family History: Last updated: 10/17/2008 Breast CA- Mother Heart dz- Father neg resp dz  Social History: 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 No hx of STds or  extramarital affairs. Former smoker.  Quit in 2006.  Smoked for approx 15 years up to 1 ppd.MarriedChildren. No exposure to dusts aerosols. LIves in suburban house in [place]. They have two pet dogs.  Risk Factors: Alcohol Use: 0 (06/29/2009) Caffeine Use: coffee (06/29/2009) Exercise: yes (06/29/2009)  Risk Factors: Smoking Status: quit (10/10/2009)  Past Pulmonary History:  Pulmonary History: WU so far has included  CBC without eos.  -CT chest 9/09 [facility]:  resolving air space dz rul, rll and shrinking nodes 01/11/08 vs 12/08/07  - Sinus CT neg July 15, 2008  - Nml PFTs 2/10 CT chest - mild air space disease on rt (resolving) Swallow evaln - mild pharyngeal dysphagia low IgM level, hypersens panel neg, ANA neg, RA mild pos, ESR 12 -Bscopy 11/26 pseudomonas  She has chronic pain since 2006 requiring spinal cord stimulator and nl pft's documented 06/15/2008.  10/2007 right chest tube  for strep intermedius empyema,  9/09 >> klebs pneumonia in sputum cx , bscopy non diagnostic incl afb, fungal,  eval by Dr Orvan Falconer - ID.   Bscopy 03/24/09 >> minimal secretions, BAL >>pseudomonas S to cipro, afb/ fungal/PCP neg Recurrences in 12/09 levaquin), 1/10, 2/10 (cipro), 6/10 (cipro) , 10/10 (cipro), 12/10 (cipro ) &  2/11 (cipro). used macrodantin for UTI in 2/10 . hernia surgery 01/04/09   Extensive w/u by ID Daiva Eves ) noted. She has lost about 5 lbs in the last year. She denies dysphagia to solids/ liquids, recurrent sinusitis, asthma or recurrent infections as a child. Pain meds being managed by Dr Ninfa Meeker, stimulator inserted by Dr Channing Mutters. 1/11 s/p steroid injection in lower thoracic nerves   pneumovax 10/09. Albuterol causes tremors. October 10, 2009--O2 started at last hospital discharge in May, no desats w/ ambulation, O2 d/c   Social History: Smoking Status:  quit  Review of Systems      See HPI  Vital Signs:  Patient profile:   60 year old female Height:      65.5  inches Weight:      130 pounds BMI:     21.38 O2 Sat:      94 % on Room air Temp:     97.1 degrees F oral Pulse rate:   60 / minute BP sitting:   120 / 70  (left arm) Cuff size:   regular  Vitals Entered By: Boone Master CNA/MA (October 10, 2009 10:20 AM)  O2 Flow:  Room air CC: 3 week follow recurrent PNA - states breathing is doing well but still having PND with clear mucus Is Patient Diabetic? No Comments Medications reviewed with patient Daytime contact number verified with patient. Boone Master CNA/MA  October 10, 2009 10:21 AM    Physical Exam  Additional Exam:  Gen: ,thin , anxious woman, in no distress  ENT: no lesions, no post nasal drip, no sinus tenderness Neck: No JVD, no TMG, no carotid bruits Lungs: No use of accessory muscles, no dullness to percussion,   Cardiovascular: RRR, heart sounds normal, no murmurs or gallops, no peripheral edema Neuro - non focal Musculoskeletal: No deformities, no cyanosis or clubbing      Impression & Recommendations:  Problem # 1:  PNEUMONIA, RECURRENT (ICD-486) Now finished IV abx . Will repeat xray today No desaturations with walking today. will d/c O2.  Her updated medication list for this problem includes:    Trimethoprim 100 Mg Tabs (Trimethoprim) .[name] Kitchen... Take 1 tab by mouth at bedtime    Cipro 750 Mg Tabs (Ciprofloxacin hcl) .[name] Kitchen... Take 1 tablet by mouth two times a day  Orders: T-2 View CXR (71020TC) Est. Patient Level IV ([address]) DME Referral (DME)  Medications Added to Medication List This Visit: 1)  Alprazolam 1 Mg Tabs (Alprazolam) .... 1/2 tab by mouth three times a day as needed 2)  Morphine Sulfate Cr 30 Mg Xr12h-tab (Morphine sulfate) .... 2 tabs by mouth every 8 hours as needed 3)  Trimethoprim 100 Mg Tabs (Trimethoprim) .... Take 1 tab by mouth at bedtime  Complete Medication List: 1)  Bystolic 5 Mg Tabs (Nebivolol hcl) .... 1/2 once daily 2)  Bayer Aspirin 325 Mg Tabs (Aspirin) .... One daily 3)  Baclofen 20  Mg Tabs (Baclofen) .[name] Kitchen.. 1 every 8 hours 4)  Amitriptyline Hcl 25 Mg Tabs (Amitriptyline hcl) .[name] Kitchen.. 1 once daily 5)  Levothroid 112 Mcg Tabs (Levothyroxine sodium) .... Once daily 6)  Mirtazapine 45 Mg Tabs (Mirtazapine) .[name] KitchenMarland KitchenMarland Kitchen  1 once daily 7)  Gabapentin 600 Mg Tabs (Gabapentin) .[name] Kitchen.. 1 every 8 hours 8)  Alprazolam 1 Mg Tabs (Alprazolam) .... 1/2 tab by mouth three times a day as needed 9)  Vesicare 5 Mg Tabs (Solifenacin succinate) .[name] Kitchen.. 1 once daily 10)  Endocet 10-325 Mg Tabs (Oxycodone-acetaminophen) .... Take 1 to 2  tablet by mouth three times a day 11)  Naftin 1 % Gel (Naftifine hcl) .... As directed 12)  Proventil Hfa 108 (90 Base) Mcg/act Aers (Albuterol sulfate) .... 2 puffs every 4 hours as needed 13)  Mucinex Maximum Strength 1200 Mg Xr12h-tab (Guaifenesin) .... Take 1 tablet by mouth two times a day 14)  Morphine Sulfate Cr 30 Mg Xr12h-tab (Morphine sulfate) .... 2 tabs by mouth every 8 hours as needed 15)  Trimethoprim 100 Mg Tabs (Trimethoprim) .... Take 1 tab by mouth at bedtime 16)  Cipro 750 Mg Tabs (Ciprofloxacin hcl) .... Take 1 tablet by mouth two times a day 17)  Tylenol Extra Strength 500 Mg Tabs (Acetaminophen) .... Take 1 tablet by mouth every4 to 6 hours for fever as needed  Patient Instructions: 1)  Please schedule a follow-up appointment in 2-3 weeks with Dr. Vassie Loll  2)  Continue on same meds.  3)  I will call with xray results.  4)   Discuss painmedications with Dr Cherylann Banas 5)  Copy sent to: Drs Cherylann Banas, spry & Prekarek  Appended Document: Orders Update - walking oximetry Ambulatory Pulse Oximetry  Resting; HR__65___    02 Sat__97___  Lap1 (185 feet)   HR__72___   02 Sat__97___ Lap2 (185 feet)   HR__78___   02 Sat__97___    Lap3 (185 feet)   HR___82__   02 Sat__95___  _X__Test Completed without Difficulty ___Test Stopped due to:    Clinical Lists Changes  Orders: Added new Service order of Pulse Oximetry, Ambulatory ([address]) - Signed

## 2010-05-29 NOTE — Progress Notes (Signed)
  Phone Note Other Incoming   Request: Send information Summary of Call: Records received from [facility]. 13 pages forwarded to Dr. Vassie Loll for review.

## 2010-05-29 NOTE — Letter (Signed)
Summary: [facility]  [address]   [place], [place] [address]   Phone: [phone]  Fax: [phone]    09/06/2009  VIOLETT HOBBS [address] [place], [place]  [address]  Dear Ms. Tamez,   I (or designee)agree to return the oxygen tank loaned to me by [facility] by the end of business today (Sep 06, 2009 @ 5pm). I do understand by signing this form I am responsible for this tank until it is returned to [facility].          Patient Name (Print)________________________________________Date:______________   Patient signature _________________________________________________  Print name of return party__________________________Date:__________  Returning party signature__________________________________________

## 2010-05-29 NOTE — Progress Notes (Signed)
Summary: talk to nurse  Phone Note Call from Patient Call back at Home Phone [phone]   Caller: Patient Call For: Urban Naval Reason for Call: Talk to Nurse Summary of Call: Pt wants to know how long she is to be on cesepine 2grams and when she should have cxr done. Initial call taken by: Darletta Moll,  Sep 18, 2009 8:48 AM  Follow-up for Phone Call        Advised pt of RA append "pl send order to [facility] for rpt cxr in 1 week & inform pt - have them fax report to [place] [address]". pt informed   Order placed.

## 2010-05-29 NOTE — Progress Notes (Signed)
Summary: letter/ recent pna  Phone Note Call from Patient Call back at Home Phone [phone]   Caller: Patient Call For: [name] Summary of Call: pt wants to speak to nurse re: getting help/ letter to her ins co. re: pulmonary issues. pt had pna recently per pt. honestly, pt wasn't very clear re: pulm issues/ what she needed. Initial call taken by: Tivis Ringer, CNA,  August 22, 2009 12:01 PM  Follow-up for Phone Call        called spoke with patient.  she stated that a corrective procedure for her back can only be performed every 6 months, but that she does not believe that she will be able to wait until July when it is due, resulting in a prior [place] w/ her insurance company.  pt then stated that she called the pain clinic physician per her ins co to have them perform this for her and that she is not sure if anything will be needed from our office or not.  told pt to call again if she discovers that information from Dr. Vassie Loll is necessary.  pt verbalized her understanding. Follow-up by: Boone Master CNA,  August 22, 2009 2:30 PM

## 2010-05-29 NOTE — Assessment & Plan Note (Signed)
Summary: HFU-PNA- OK PER KATY-PER RA ///kp   Visit Type:  Hospital Follow-up Copy to:  Dr. Joetta Manners Primary Provider/Referring Provider:  Dr. Joetta Manners  CC:  Pt here for hospital follow up. Pt states breathing is getting better everyday. Pt states is currently on IV antibiotic Cefepime.  History of Present Illness: 37 yowf quit smoking 2006,  for FU of  recurrent  Rt sided pneumonias starting 2008.   August 21, 2009--Presents for an acute office visit. Complains of acute onset of fever/chills, increased SOB, prod cough with green mucus, weakness, HA, nausea onset last night.   pt finished round of cefdinir given by PCP 10 days  , and began cipro    Sep 06, 2009 2:15 PM  Improved , then got worse again, now c/o rt chest pain, worsening dyspnea, desaturated to 80% in office today. CXR shows worsening rt infiltrates. Morphine dose has been increased, surgical procedure deferred .  Sep 15, 2009 2:10 PM  hospitalised 5/11-16 for extensive rt pneumonia & hypoxia - IV cefepime & vanc via PICC x 14 ds + Po cipro . Discussed high morphine dosing with dr Cherylann Banas --& xanax dosing with dr Raynaldo Opitz. Have informed dr Carolyne Fiscal of present situation. swallow study showed intermittent penetration of thin and nectar thick liquids. speech therapy recomm - eat and drink only when alert, seat at upright at 90 degrees, small bites and sips and intermittently clear her throat post swallowing.  O2 helps, cxr today shows persistent rt infx extensive  Denies chest pain,  orthopnea, hemoptysis, fever, n/v/d, edema, headache.     Current Medications (verified): 1)  Bystolic 5 Mg Tabs (Nebivolol Hcl) .... 1/2 Once Daily 2)  Bayer Aspirin 325 Mg Tabs (Aspirin) .... One Daily 3)  Baclofen 20 Mg Tabs (Baclofen) .[name] Kitchen.. 1 Every 8 Hours 4)  Amitriptyline Hcl 25 Mg Tabs (Amitriptyline Hcl) .[name] Kitchen.. 1 Once Daily 5)  Levothroid 112 Mcg Tabs (Levothyroxine Sodium) .... Once Daily 6)  Mirtazapine 45 Mg Tabs (Mirtazapine) .[name] Kitchen.. 1 Once  Daily 7)  Gabapentin 600 Mg Tabs (Gabapentin) .[name] Kitchen.. 1 Every 8 Hours 8)  Alprazolam 1 Mg Tabs (Alprazolam) .[name] Kitchen.. 1 Four Times A Day As Needed 9)  Vesicare 5 Mg Tabs (Solifenacin Succinate) .[name] Kitchen.. 1 Once Daily 10)  Endocet 10-325 Mg Tabs (Oxycodone-Acetaminophen) .... Take 1 To 2  Tablet By Mouth Three Times A Day 11)  Naftin 1 % Gel (Naftifine Hcl) .... As Directed 12)  Proventil Hfa 108 (90 Base) Mcg/act Aers (Albuterol Sulfate) .... 2 Puffs Every 4 Hours As Needed 13)  Mucinex Maximum Strength 1200 Mg Xr12h-Tab (Guaifenesin) .... Take 1 Tablet By Mouth Two Times A Day 14)  Morphine Sulfate Cr 30 Mg Xr12h-Tab (Morphine Sulfate) .... As Directed (100mg  Every 6 Hours) 15)  Trimethoprm 100 Mg .[name] KitchenMarland Kitchen. 1 At Bedtime 16)  Cipro 750 Mg Tabs (Ciprofloxacin Hcl) .... Take 1 Tablet By Mouth Two Times A Day 17)  Tylenol Extra Strength 500 Mg Tabs (Acetaminophen) .... Take 1 Tablet By Mouth Every4 To 6 Hours For Fever As Needed  Allergies (verified): 1)  ! * Oxycontin  Past History:  Past Medical History: Last updated: 05/22/2009 Anxiety Depression Headache Hypertension Hypothyroidism Osteoarthritis Urinary incontinence Dyspnea     - PFT's completely nl 06/15/2008 Recurrent pneumonias  after empyema 7/09 ([place])       - Chest CT [facility]:  resolving air space dz rul, rll and shrinking nodes 01/11/08 vs 12/08/07       - Sinus CT neg July 15, 2008  CT 11/4/210 sinuses without acute disease CT"" LUng with bilateral airspace disease predominantly involving the RLL Sp BAL and biopsies on Bronchoscopy with >100K pseudomonas and bx with anthracosis and chronic granulatmous infection  Social History: 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 Former smoker.  Quit in 2006.  Smoked for approx 15 years up to 1 ppd.MarriedChildren. No exposure to dusts aerosols. LIves in suburban house in [place]. They have two pet dogs.  Past  Pulmonary History:  Pulmonary History: WU so far has included  CBC without eos.  -CT chest 9/09 [facility]:  resolving air space dz rul, rll and shrinking nodes 01/11/08 vs 12/08/07  - Sinus CT neg July 15, 2008  - Nml PFTs 2/10 CT chest - mild air space disease on rt (resolving) Swallow evaln - mild pharyngeal dysphagia low IgM level, hypersens panel neg, ANA neg, RA mild pos, ESR 12 -Bscopy 11/26 pseudomonas  She has chronic pain since 2006 requiring spinal cord stimulator and nl pft's documented 06/15/2008.  10/2007 right chest tube for strep intermedius empyema,  9/09 >> klebs pneumonia in sputum cx , bscopy non diagnostic incl afb, fungal,  eval by Dr Orvan Falconer - ID.   Bscopy 03/24/09 >> minimal secretions, BAL >>pseudomonas S to cipro, afb/ fungal/PCP neg Recurrences in 12/09 levaquin), 1/10, 2/10 (cipro), 6/10 (cipro) , 10/10 (cipro), 12/10 (cipro ) &  2/11 (cipro). used macrodantin for UTI in 2/10 . hernia surgery 01/04/09   Extensive w/u by ID Daiva Eves ) noted. She has lost about 5 lbs in the last year. She denies dysphagia to solids/ liquids, recurrent sinusitis, asthma or recurrent infections as a child. Pain meds being managed by Dr Ninfa Meeker, stimulator inserted by Dr Channing Mutters. 1/11 s/p steroid injection in lower thoracic nerves   pneumovax 10/09. Albuterol causes tremors.  Vital Signs:  Patient profile:   60 year old female Height:      65.5 inches Weight:      122 pounds O2 Sat:      96 % on 3 L/min Temp:     98.1 degrees F oral Pulse rate:   74 / minute BP sitting:   124 / 68  (left arm) Cuff size:   regular  Vitals Entered By: Zackery Barefoot CMA (Sep 15, 2009 1:52 PM)  O2 Flow:  3 L/min CC: Pt here for hospital follow up. Pt states breathing is getting better everyday. Pt states is currently on IV antibiotic Cefepime Comments Medications reviewed with patient Verified contact number and pharmacy with patient  Zackery Barefoot CMA  Sep 15, 2009 1:53 PM    Physical  Exam  Additional Exam:  Gen: ,thin , anxious woman, in no distress  ENT: no lesions, no post nasal drip, no sinus tenderness Neck: No JVD, no TMG, no carotid bruits Lungs: No use of accessory muscles, no dullness to percussion,  crackles rt , clear lt, no rhonchi Cardiovascular: RRR, heart sounds normal, no murmurs or gallops, no peripheral edema Neuro - non focal Musculoskeletal: No deformities, no cyanosis or clubbing      Impression & Recommendations:  Problem # 1:  PNEUMONIA, RECURRENT (ICD-486)  she would need cxr fu every week to resolution - she prefers to get this at [facility]. I have asked her to see dr Carolyne Fiscal ina week-10 ds to ensure clinical improvement as well , if so can dc PIcc line - this was d/w dr Carolyne Fiscal. ABx total x 14 days -longer  if any clinical deterioration Aspiration precautions were discussed Her updated medication list for this problem includes:    Cipro 750 Mg Tabs (Ciprofloxacin hcl) .[name] Kitchen... Take 1 tablet by mouth two times a day  Orders: Est. Patient Level IV ([address])  Problem # 2:  CHRONIC PAIN SYNDROME (ICD-338.4)  have d/w dr Cherylann Banas & dr Raynaldo Opitz re: decreasing her morphine & xanax dosing. consider non narcotic means of pain control  Orders: Est. Patient Level IV ([address])  Medications Added to Medication List This Visit: 1)  Endocet 10-325 Mg Tabs (Oxycodone-acetaminophen) .... Take 1 to 2  tablet by mouth three times a day  Patient Instructions: 1)  Please schedule a follow-up appointment in 3 weeks with TP. 2)  Chest xray should be done every week  3)  If better in1 week, PICC line can be taken out 4)  Discuss painmedications with Dr Cherylann Banas 5)  Copy sent to: Drs Cherylann Banas, spry & Prekarek  Appended Document: HFU-PNA- OK PER KATY-PER RA ///kp pl send order to [facility] for rpt cxr in 1 week & inform pt - have them fax report to [place] [address]  Appended Document: HFU-PNA- OK PER KATY-PER RA ///kp pt informed

## 2010-05-29 NOTE — Progress Notes (Signed)
Summary: results/ med question  Phone Note Call from Patient Call back at Home Phone [phone]   Caller: Patient Call For: alva Summary of Call: pt requests results of cxr that she had yesterday. also wants to know if she should be on an "oral" abx after picc line is removed.  Initial call taken by: Tivis Ringer, CNA,  Sep 26, 2009 11:38 AM  Follow-up for Phone Call        Please advise. Zackery Barefoot CMA  Sep 26, 2009 11:42 AM   pl obtain cxr report Follow-up by: Comer Locket. Vassie Loll MD,  Sep 26, 2009 5:17 PM  Additional Follow-up for Phone Call Additional follow up Details #1::        Shanda Bumps, can you please help RA track down these results.  Thanks.  Arman Filter LPN  Sep 26, 2009 5:28 PM    Duplicate messages taken in pt's chart. Please see other PN from today. Signing off on this note. Zackery Barefoot CMA  Sep 26, 2009 5:37 PM

## 2010-05-29 NOTE — Assessment & Plan Note (Signed)
Summary: Acute NP office visit - poss PNA   Copy to:  Dr. Joetta Manners Primary Provider/Referring Provider:  Dr. Joetta Manners  CC:  fever/chills, increased SOB, prod cough with green mucus, weakness, HA, nausea onset last night.  pt has recurrent pneumonia.  pt finished round of cefdinir given by PCP, and and began cipro early this morning .  History of Present Illness: 60 yowf quit smoking 2006,  for FU of  recurrent  Rt sided pneumonias starting 2008.  She has chronic pain since 2006 requiring spinal cord stimulator and nl pft's documented 06/15/2008.  10/2007 right chest tube for strep intermedius empyema,  9/09 >> klebs pneumonia in sputum cx , bscopy non diagnostic incl afb, fungal,  eval by Dr Orvan Falconer - ID.   Bscopy 11/26 >> minimal secretions, BAL >>pseudomonas S to cipro, afb/ fungal/PCP neg Recurrences in 12/09 levaquin), 1/10, 2/10 (cipro), 6/10 (cipro) , 10/10 (cipro), 12/10 (cipro ) & most recently 2/11 (cipro). used macrodantin for UTI in 2/10 . hernia surgery 01/04/09  > ov 02/03/09 by Dr Delford Field, dx right pna, rx cipro x 10 days.  Extensive w/u by ID Daiva Eves ) noted. She has lost about 5 lbs in the last year. She denies dysphagia to solids/ liquids, recurrent sinusitis, asthma or recurrent infections as a child. Pain meds being managed by Dr Ninfa Meeker, stimulator inserted by Dr Channing Mutters. 1/11 s/p steroid injection in lower thoracic nerves  - Dr Ninfa Meeker to try & cut back on narcotics pneumovax 10/09. Albuterol causes tremors.  June 14, 2009  Feels better, CXR - pna resolved. Just had her first grandchild. Pain worse on car ride. Discussed problems with daily suppressive Abx , favor intermitent based on symptoms.  August 21, 2009--Presents for an acute office visit. Complains of acute onset of fever/chills, increased SOB, prod cough with green mucus, weakness, HA, nausea onset last night.  pt has recurrent pneumonia.  pt finished round of cefdinir given by PCP 10 days  , and began cipro  early this morning. We discuussed sputum cx but she does not think she can do this today. Denies chest pain,  orthopnea, hemoptysis, fever, n/v/d, edema, headache.     Medications Prior to Update: 1)  Bystolic 5 Mg Tabs (Nebivolol Hcl) .... 1/2 Once Daily 2)  Bayer Aspirin 325 Mg Tabs (Aspirin) .... One Daily 3)  Baclofen 20 Mg Tabs (Baclofen) .[name] Kitchen.. 1 Every 8 Hours 4)  Amitriptyline Hcl 25 Mg Tabs (Amitriptyline Hcl) .[name] Kitchen.. 1 Once Daily 5)  Levothroid 112 Mcg Tabs (Levothyroxine Sodium) .... Once Daily 6)  Mirtazapine 45 Mg Tabs (Mirtazapine) .[name] Kitchen.. 1 Once Daily 7)  Gabapentin 600 Mg Tabs (Gabapentin) .[name] Kitchen.. 1 Every 8 Hours 8)  Alprazolam 1 Mg Tabs (Alprazolam) .[name] Kitchen.. 1 Four Times A Day As Needed 9)  Vesicare 5 Mg Tabs (Solifenacin Succinate) .[name] Kitchen.. 1 Once Daily 10)  Endocet 10-325 Mg Tabs (Oxycodone-Acetaminophen) .[name] Kitchen.. 1 Every 6 Hours As Needed 11)  Naftin 1 % Gel (Naftifine Hcl) .... As Directed 12)  Xyzal 5 Mg Tabs (Levocetirizine Dihydrochloride) .[name] Kitchen.. 1 Once Daily As Needed 13)  Proventil Hfa 108 (90 Base) Mcg/act Aers (Albuterol Sulfate) .... 2 Puffs Every 4 Hours As Needed 14)  Mucinex Maximum Strength 1200 Mg Xr12h-Tab (Guaifenesin) .... Take 1 Tablet By Mouth Two Times A Day 15)  Morphine Sulfate Cr 30 Mg Xr12h-Tab (Morphine Sulfate) .... As Directed 16)  Trimethoprm 100 Mg .[name] KitchenMarland Kitchen. 1 At Bedtime 17)  Cipro 750 Mg Tabs (Ciprofloxacin Hcl) .... Take 1 Tablet By  Mouth Two Times A Day  Current Medications (verified): 1)  Bystolic 5 Mg Tabs (Nebivolol Hcl) .... 1/2 Once Daily 2)  Bayer Aspirin 325 Mg Tabs (Aspirin) .... One Daily 3)  Baclofen 20 Mg Tabs (Baclofen) .[name] Kitchen.. 1 Every 8 Hours 4)  Amitriptyline Hcl 25 Mg Tabs (Amitriptyline Hcl) .[name] Kitchen.. 1 Once Daily 5)  Levothroid 112 Mcg Tabs (Levothyroxine Sodium) .... Once Daily 6)  Mirtazapine 45 Mg Tabs (Mirtazapine) .[name] Kitchen.. 1 Once Daily 7)  Gabapentin 600 Mg Tabs (Gabapentin) .[name] Kitchen.. 1 Every 8 Hours 8)  Alprazolam 1 Mg Tabs (Alprazolam) .[name] Kitchen.. 1 Four Times A Day  As Needed 9)  Vesicare 5 Mg Tabs (Solifenacin Succinate) .[name] Kitchen.. 1 Once Daily 10)  Endocet 10-325 Mg Tabs (Oxycodone-Acetaminophen) .[name] Kitchen.. 1 Every 6 Hours As Needed 11)  Naftin 1 % Gel (Naftifine Hcl) .... As Directed 12)  Xyzal 5 Mg Tabs (Levocetirizine Dihydrochloride) .[name] Kitchen.. 1 Once Daily As Needed 13)  Proventil Hfa 108 (90 Base) Mcg/act Aers (Albuterol Sulfate) .... 2 Puffs Every 4 Hours As Needed 14)  Mucinex Maximum Strength 1200 Mg Xr12h-Tab (Guaifenesin) .... Take 1 Tablet By Mouth Two Times A Day 15)  Morphine Sulfate Cr 30 Mg Xr12h-Tab (Morphine Sulfate) .... As Directed 16)  Trimethoprm 100 Mg .[name] KitchenMarland Kitchen. 1 At Bedtime 17)  Cipro 750 Mg Tabs (Ciprofloxacin Hcl) .... Take 1 Tablet By Mouth Two Times A Day  Allergies (verified): 1)  ! * Oxycontin  Past History:  Past Medical History: Last updated: 05/22/2009 Anxiety Depression Headache Hypertension Hypothyroidism Osteoarthritis Urinary incontinence Dyspnea     - PFT's completely nl 06/15/2008 Recurrent pneumonias  after empyema 7/09 ([place])       - Chest CT [place]:  resolving air space dz rul, rll and shrinking nodes 01/11/08 vs 12/08/07       - Sinus CT neg July 15, 2008  CT 11/4/210 sinuses without acute disease CT"" LUng with bilateral airspace disease predominantly involving the RLL Sp BAL and biopsies on Bronchoscopy with >100K pseudomonas and bx with anthracosis and chronic granulatmous infection  Past Surgical History: Last updated: 04/18/2009 achilles tendon repair Lumbar laminectomy Lumbar fusion Tubal ligation Nerve stimulator insertion and subsequent removal Lumbar hernia repair 01/04/09 [facility]  Family History: Last updated: 10/17/2008 Breast CA- Mother Heart dz- Father neg resp dz  Social History: 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 Former smoker.  Quit in 2006.  Smoked for approx 15 years up to 1 ppd.MarriedChildren. No exposure to  dusts aerosols. LIves in suburban house in [place]. They have two pet dogs.  Risk Factors: Alcohol Use: 0 (06/29/2009) Caffeine Use: coffee (06/29/2009) Exercise: yes (06/29/2009)  Risk Factors: Smoking Status: never (06/29/2009)  Past Pulmonary History:  Pulmonary History: [facility] so far has included  CBC without eos.  -CT chest 9/09 [facility]:  resolving air space dz rul, rll and shrinking nodes 01/11/08 vs 12/08/07  - Sinus CT neg July 15, 2008  - Nml PFTs 2/10 CT chest - mild air space disease on rt (resolving) Swallow evaln - mild pharyngeal dysphagia low IgM level, hypersens panel neg, ANA neg, RA mild pos, ESR 12 -Bscopy 11/26 pseudomonas  Review of Systems      See HPI  Vital Signs:  Patient profile:   60 year old female Height:      65.5 inches Weight:      132.31 pounds BMI:     21.76 O2 Sat:      97 % on Room air  Temp:     100.0 degrees F oral Pulse rate:   85 / minute BP sitting:   116 / 78  (right arm) Cuff size:   regular  Vitals Entered By: Boone Master CNA (August 21, 2009 3:28 PM)  O2 Flow:  Room air CC: fever/chills, increased SOB, prod cough with green mucus, weakness, HA, nausea onset last night.  pt has recurrent pneumonia.  pt finished round of cefdinir given by PCP, and began cipro early this morning  Is Patient Diabetic? No Comments Medications reviewed with patient Daytime contact number verified with patient. Boone Master CNA  August 21, 2009 3:30 PM    Physical Exam  Additional Exam:  Gen: ,thin , anxious woman, in no distress  ENT: no lesions, no post nasal drip, no sinus tenderness Neck: No JVD, no TMG, no carotid bruits Lungs: No use of accessory muscles, no dullness to percussion,  clear to auscultation Cardiovascular: RRR, heart sounds normal, no murmurs or gallops, no peripheral edema Musculoskeletal: No deformities, no cyanosis or clubbing      Impression & Recommendations:  Problem # 1:  PNEUMONIA, RECURRENT  (ICD-486) Sputum sample to return if possible  REC Finish Cipro for total of 10 days Muciinex DM two times a day  Increase fluids.  Return sputum sample if possible.  I will call with xray results.  Please contact office for sooner follow up if symptoms do not improve or worsen  follow up Dr. Vassie Loll in 2-3 weeks as scheduled and as needed   Her updated medication list for this problem includes:    Cipro 750 Mg Tabs (Ciprofloxacin hcl) .[name] Kitchen... Take 1 tablet by mouth two times a day  Orders: T-2 View CXR (71020TC) T-Culture, Sputum & Gram Stain ([id number]) Est. Patient Level IV (56433)  Complete Medication List: 1)  Bystolic 5 Mg Tabs (Nebivolol hcl) .... 1/2 once daily 2)  Bayer Aspirin 325 Mg Tabs (Aspirin) .... One daily 3)  Baclofen 20 Mg Tabs (Baclofen) .[name] Kitchen.. 1 every 8 hours 4)  Amitriptyline Hcl 25 Mg Tabs (Amitriptyline hcl) .[name] Kitchen.. 1 once daily 5)  Levothroid 112 Mcg Tabs (Levothyroxine sodium) .... Once daily 6)  Mirtazapine 45 Mg Tabs (Mirtazapine) .[name] Kitchen.. 1 once daily 7)  Gabapentin 600 Mg Tabs (Gabapentin) .[name] Kitchen.. 1 every 8 hours 8)  Alprazolam 1 Mg Tabs (Alprazolam) .[name] Kitchen.. 1 four times a day as needed 9)  Vesicare 5 Mg Tabs (Solifenacin succinate) .[name] Kitchen.. 1 once daily 10)  Endocet 10-325 Mg Tabs (Oxycodone-acetaminophen) .[name] Kitchen.. 1 every 6 hours as needed 11)  Naftin 1 % Gel (Naftifine hcl) .... As directed 12)  Xyzal 5 Mg Tabs (Levocetirizine dihydrochloride) .[name] Kitchen.. 1 once daily as needed 13)  Proventil Hfa 108 (90 Base) Mcg/act Aers (Albuterol sulfate) .... 2 puffs every 4 hours as needed 14)  Mucinex Maximum Strength 1200 Mg Xr12h-tab (Guaifenesin) .... Take 1 tablet by mouth two times a day 15)  Morphine Sulfate Cr 30 Mg Xr12h-tab (Morphine sulfate) .... As directed 16)  Trimethoprm 100 Mg  .[name] Kitchen.. 1 at bedtime 17)  Cipro 750 Mg Tabs (Ciprofloxacin hcl) .... Take 1 tablet by mouth two times a day  Patient Instructions: 1)  Finish Cipro for total of 10 days 2)  Muciinex DM two times  a day  3)  Increase fluids.  4)  Return sputum sample if possible.  5)  I will call with xray results.  6)  Please contact office for sooner follow up if symptoms do not improve or worsen  7)  follow up Dr. Vassie Loll in 2-3 weeks as scheduled and as needed

## 2010-05-29 NOTE — Progress Notes (Signed)
Summary: pic line  Phone Note Call from Patient   Caller: Patient Call For: alva Summary of Call: pt need to discuss pic line removal Initial call taken by: Rickard Patience,  September 27, 2009 1:55 PM  Follow-up for Phone Call        CXR appears improved OK to remove PICC line  - pl send order to home care Follow-up by: Comer Locket. Vassie Loll MD,  September 27, 2009 1:58 PM  Additional Follow-up for Phone Call Additional follow up Details #1::        Order sent to [facility] to remove pic line.  Pt aware this has been done and also that her cxr appears improved. Additional Follow-up by: Vernie Murders,  September 27, 2009 2:34 PM

## 2010-05-29 NOTE — Assessment & Plan Note (Signed)
Summary: cough congestion, thinks pna coming back/apc   Visit Type:  Sick visit Copy to:  Dr. Joetta Manners Primary Provider/Referring Provider:  Dr. Joetta Manners  CC:  Pt c/o increased SOB.  History of Present Illness: 56 yowf quit smoking 2006,  for FU of  recurrent  Rt sided pneumonias starting 2008.  She has chronic pain since 2006 requiring spinal cord stimulator and nl pft's documented 06/15/2008.  10/2007 right chest tube for strep intermedius empyema,  9/09 >> klebs pneumonia in sputum cx , bscopy non diagnostic incl afb, fungal,  eval by Dr Orvan Falconer - ID.   Bscopy 03/24/09  >> minimal secretions, BAL >>pseudomonas S to cipro, afb/ fungal/PCP neg Recurrences in 12/09 levaquin), 1/10, 2/10 (cipro), 6/10 (cipro) , 10/10 (cipro), 12/10 (cipro ) & most recently 2/11 (cipro). used macrodantin for UTI in 2/10 . hernia surgery 01/04/09   Extensive w/u by ID Daiva Eves ) noted. She has lost about 5 lbs in the last year. She denies dysphagia to solids/ liquids, recurrent sinusitis, asthma or recurrent infections as a child. Pain meds being managed by Dr Ninfa Meeker, stimulator inserted by Dr Channing Mutters. 1/11 s/p steroid injection in lower thoracic nerves  - Dr Ninfa Meeker to try & cut back on narcotics pneumovax 10/09. Albuterol causes tremors.  August 21, 2009--Presents for an acute office visit. Complains of acute onset of fever/chills, increased SOB, prod cough with green mucus, weakness, HA, nausea onset last night.   pt finished round of cefdinir given by PCP 10 days  , and began cipro    Sep 06, 2009 2:15 PM  Improved , then got worse again, now c/o rt chest pain, worsening dyspnea, desaturated to 80% in office today. CXR shows worsening rt infiltrates. Morphine dose has been increased, surgical procedure deferred .  Denies chest pain,  orthopnea, hemoptysis, fever, n/v/d, edema, headache.     Current Medications (verified): 1)  Bystolic 5 Mg Tabs (Nebivolol Hcl) .... 1/2 Once Daily 2)  Bayer Aspirin 325  Mg Tabs (Aspirin) .... One Daily 3)  Baclofen 20 Mg Tabs (Baclofen) .[name] Kitchen.. 1 Every 8 Hours 4)  Amitriptyline Hcl 25 Mg Tabs (Amitriptyline Hcl) .[name] Kitchen.. 1 Once Daily 5)  Levothroid 112 Mcg Tabs (Levothyroxine Sodium) .... Once Daily 6)  Mirtazapine 45 Mg Tabs (Mirtazapine) .[name] Kitchen.. 1 Once Daily 7)  Gabapentin 600 Mg Tabs (Gabapentin) .[name] Kitchen.. 1 Every 8 Hours 8)  Alprazolam 1 Mg Tabs (Alprazolam) .[name] Kitchen.. 1 Four Times A Day As Needed 9)  Vesicare 5 Mg Tabs (Solifenacin Succinate) .[name] Kitchen.. 1 Once Daily 10)  Endocet 10-325 Mg Tabs (Oxycodone-Acetaminophen) .[name] Kitchen.. 1 Every 6 Hours As Needed 11)  Naftin 1 % Gel (Naftifine Hcl) .... As Directed 12)  Xyzal 5 Mg Tabs (Levocetirizine Dihydrochloride) .[name] Kitchen.. 1 Once Daily As Needed 13)  Proventil Hfa 108 (90 Base) Mcg/act Aers (Albuterol Sulfate) .... 2 Puffs Every 4 Hours As Needed 14)  Mucinex Maximum Strength 1200 Mg Xr12h-Tab (Guaifenesin) .... Take 1 Tablet By Mouth Two Times A Day 15)  Morphine Sulfate Cr 30 Mg Xr12h-Tab (Morphine Sulfate) .... As Directed (100mg  Every 6 Hours) 16)  Trimethoprm 100 Mg .[name] KitchenMarland Kitchen. 1 At Bedtime 17)  Cipro 750 Mg Tabs (Ciprofloxacin Hcl) .... Take 1 Tablet By Mouth Two Times A Day 18)  Tylenol Extra Strength 500 Mg Tabs (Acetaminophen) .... Take 1 Tablet By Mouth Every4 To 6 Hours For Fever As Needed  Allergies (verified): 1)  ! * Oxycontin  Past History:  Past Medical History: Last updated: 05/22/2009 Anxiety Depression Headache  Hypertension Hypothyroidism Osteoarthritis Urinary incontinence Dyspnea     - PFT's completely nl 06/15/2008 Recurrent pneumonias  after empyema 7/09 ([place])       - Chest CT [place]:  resolving air space dz rul, rll and shrinking nodes 01/11/08 vs 12/08/07       - Sinus CT neg July 15, 2008  CT 11/4/210 sinuses without acute disease CT"" LUng with bilateral airspace disease predominantly involving the RLL Sp BAL and biopsies on Bronchoscopy with >100K pseudomonas and bx with anthracosis and chronic  granulatmous infection  Past Surgical History: Last updated: 04/18/2009 achilles tendon repair Lumbar laminectomy Lumbar fusion Tubal ligation Nerve stimulator insertion and subsequent removal Lumbar hernia repair 01/04/09 [facility]  Family History: Last updated: 10/17/2008 Breast CA- Mother Heart dz- Father neg resp dz  Social History: 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 Former smoker.  Quit in 2006.  Smoked for approx 15 years up to 1 ppd.MarriedChildren. No exposure to dusts aerosols. LIves in suburban house in [place]. They have two pet dogs.  Risk Factors: Smoking Status: never (06/29/2009)  Past Pulmonary History:  Pulmonary History: [facility] so far has included  CBC without eos.  -CT chest 9/09 [facility]:  resolving air space dz rul, rll and shrinking nodes 01/11/08 vs 12/08/07  - Sinus CT neg July 15, 2008  - Nml PFTs 2/10 CT chest - mild air space disease on rt (resolving) Swallow evaln - mild pharyngeal dysphagia low IgM level, hypersens panel neg, ANA neg, RA mild pos, ESR 12 -Bscopy 11/26 pseudomonas  Review of Systems       The patient complains of shortness of breath with activity, non-productive cough, chest pain, loss of appetite, difficulty swallowing, headaches, nasal congestion/difficulty breathing through nose, and rash.  The patient denies shortness of breath at rest, productive cough, coughing up blood, irregular heartbeats, acid heartburn, indigestion, weight change, abdominal pain, sore throat, tooth/dental problems, sneezing, itching, ear ache, anxiety, depression, hand/feet swelling, joint stiffness or pain, change in color of mucus, and fever.    Vital Signs:  Patient profile:   60 year old female Height:      65.5 inches Weight:      130 pounds O2 Sat:      83 % on Room air Temp:     98.8 degrees F oral Pulse rate:   79 / minute BP sitting:   98 / 60  (right arm) Cuff  size:   regular  Vitals Entered By: Zackery Barefoot CMA (Sep 06, 2009 1:56 PM)  O2 Flow:  Room air CC: Pt c/o increased SOB Comments Medications reviewed with patient Verified contact number and pharmacy with patient Zackery Barefoot CMA  Sep 06, 2009 1:56 PM    Physical Exam  Additional Exam:  Gen: ,thin , anxious woman, in no distress  ENT: no lesions, no post nasal drip, no sinus tenderness Neck: No JVD, no TMG, no carotid bruits Lungs: No use of accessory muscles, no dullness to percussion,  crackles rt , clear lt, no rhonchi Cardiovascular: RRR, heart sounds normal, no murmurs or gallops, no peripheral edema Neuro - non focal Musculoskeletal: No deformities, no cyanosis or clubbing      Impression & Recommendations:  Problem # 1:  PNEUMONIA, RECURRENT (ICD-486) Would cover for MDR organisms with ceftaz & vanc - given pseudomonas in the past, consider adding another anti-pseudomnal abx if deterioration obtain sputum cx - recent cx was non diagnostic. Rpt modified barium swallow  to r/o aspiration Hospital admission Her updated medication list for this problem includes:    Cipro 750 Mg Tabs (Ciprofloxacin hcl) .[name] Kitchen... Take 1 tablet by mouth two times a day  Orders: T-2 View CXR (71020TC) Est. Patient Level V (14782)  Problem # 2:  CHRONIC PAIN SYNDROME (ICD-338.4)  ct same dose of morphine Likley recurrent rt sided pneumonias related to aspiration due to high dose narcotics. have discusse dthis with pt & husband but pain does seem to be genuine -  Orders: Est. Patient Level V (95621)  Medications Added to Medication List This Visit: 1)  Morphine Sulfate Cr 30 Mg Xr12h-tab (Morphine sulfate) .... As directed (100mg  every 6 hours) 2)  Tylenol Extra Strength 500 Mg Tabs (Acetaminophen) .... Take 1 tablet by mouth every4 to 6 hours for fever as needed  Patient Instructions: 1)  Copy sent to:Dr Spry 2)  Hospital admission  Appended Document: cough congestion,  thinks pna coming back/apc faxed to [phone] @ [phone] pt in room 5159

## 2010-05-29 NOTE — Progress Notes (Signed)
Summary: talk to nurse  Phone Note Call from Patient Call back at Home Phone [phone]   Caller: Patient Call For: alva Reason for Call: Talk to Nurse Summary of Call: Pt says she called [facility] and they haven't received order to d/c O2 yet, pls advise. Initial call taken by: Darletta Moll,  October 11, 2009 3:55 PM  Follow-up for Phone Call        Per Vella Redhead picked this order up today and it might take a day or two for order to be processed.   Called, spoke with pt.  Pt states since she called office in regards to this, she has spoken with [facility] again and they now have the order.  Will sign off on this note.  Gweneth Dimitri RN  October 11, 2009 4:13 PM

## 2010-05-29 NOTE — Assessment & Plan Note (Signed)
Summary: ? pna/apc   Visit Type:  Follow-up Copy to:  Dr. Joetta Manners Primary Provider/Referring Provider:  Dr. Joetta Manners  CC:  Pt c/o feeling tired, sweats, headaches, nausea, productive cough with green mucus, right back pressure, and and fever of  T-101.4 on Friday.  History of Present Illness: 49 yowf quit smoking 2006,  chronic pain since 2006 requiring spinal cord stimulator and nl pft's documented 06/15/2008. She has recurrent  Rt sided pneumonias starting 2008.  10/2007 right chest tube for strep intermedius empyema,  9/09 >> klebs pneumonia in sputum cx , bscopy non diagnostic incl afb, fungal,  eval by Dr Orvan Falconer - ID,  Unltimately improved  without chronic abx needed.  Recurrences in 12/09 levaquin), 1/10, 2/10 (cipro), 6/10 (cipro) , 10/10 (cipro) & most recently 12/10 used macrodantin for UTI in 2/10 . hernia surgery 01/04/09  > ov 02/03/09 by Dr Delford Field, dx right pna, rx cipro x 10 days.  She has lost about 5 lbs in the last year. She denies dysphagia to solids/ liquids, recurrent sinusitis, asthma or recurrent infections as a child. Pain meds being managed by Dr Ninfa Meeker, stimulator inserted by Dr Channing Mutters. pneumovax 10/09  April 05, 2009 10:27 AM  Bscopy 11/26 >> minimal secretions, BAL >>pseudomonas S to cipro, afb/ fungal/PCP neg She feels like another pneumonia is coming on. c/o excessive sweating - pain MD thinks this is not related to narcotics. Albuterol causes tremors.  June 05, 2009 4:44 PM  Pt c/o feeling tired, sweats, headaches, nausea, productive cough with green mucus, right back pressure, and and fever of  T-101.4 on 2/4, started cipro, CXR - rt ASD. Ongoing wu by ID Procedure for nerves, cutting back on narcotics,   Current Medications (verified): 1)  Bystolic 5 Mg Tabs (Nebivolol Hcl) .... 1/2 Once Daily 2)  Bayer Aspirin 325 Mg Tabs (Aspirin) .... One Daily 3)  Baclofen 20 Mg Tabs (Baclofen) .[name] Kitchen.. 1 Every 8 Hours 4)  Amitriptyline Hcl 25 Mg Tabs  (Amitriptyline Hcl) .[name] Kitchen.. 1 Once Daily 5)  Levothroid 112 Mcg Tabs (Levothyroxine Sodium) .... Once Daily 6)  Mirtazapine 45 Mg Tabs (Mirtazapine) .[name] Kitchen.. 1 Once Daily 7)  Gabapentin 600 Mg Tabs (Gabapentin) .[name] Kitchen.. 1 Every 8 Hours 8)  Alprazolam 1 Mg Tabs (Alprazolam) .[name] Kitchen.. 1 Four Times A Day As Needed 9)  Vesicare 5 Mg Tabs (Solifenacin Succinate) .[name] Kitchen.. 1 Once Daily 10)  Endocet 10-325 Mg Tabs (Oxycodone-Acetaminophen) .[name] Kitchen.. 1 Every 6 Hours As Needed 11)  Naftin 1 % Gel (Naftifine Hcl) .... As Directed 12)  Xyzal 5 Mg Tabs (Levocetirizine Dihydrochloride) .[name] Kitchen.. 1 Once Daily As Needed 13)  Proventil Hfa 108 (90 Base) Mcg/act Aers (Albuterol Sulfate) .... 2 Puffs Every 4 Hours As Needed 14)  Mucinex Maximum Strength 1200 Mg Xr12h-Tab (Guaifenesin) .... Take 1 Tablet By Mouth Two Times A Day 15)  Morphine Sulfate Cr 30 Mg Xr12h-Tab (Morphine Sulfate) .... As Directed 16)  Trimethoprm 100 Mg .[name] KitchenMarland Kitchen. 1 At Bedtime  Allergies (verified): 1)  ! * Oxycontin  Past History:  Past Medical History: Last updated: 05/22/2009 Anxiety Depression Headache Hypertension Hypothyroidism Osteoarthritis Urinary incontinence Dyspnea     - PFT's completely nl 06/15/2008 Recurrent pneumonias  after empyema 7/09 ()       - Chest CT [place]:  resolving air space dz rul, rll and shrinking nodes 01/11/08 vs 12/08/07       - Sinus CT neg July 15, 2008  CT 11/4/210 sinuses without acute disease CT"" LUng with bilateral airspace  disease predominantly involving the RLL Sp BAL and biopsies on Bronchoscopy with >100K pseudomonas and bx with anthracosis and chronic granulatmous infection  Social History: 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 Former smoker.  Quit in 2006.  Smoked for approx 15 years up to 1 ppd.MarriedChildren. No exposure to dusts aerosols. LIves in suburban house in [place]. They have two pet dogs.  Past Pulmonary  History:  Pulmonary History: [facility] so far has included  CBC without eos.  -CT chest 9/09 [facility]:  resolving air space dz rul, rll and shrinking nodes 01/11/08 vs 12/08/07  - Sinus CT neg July 15, 2008  - Nml PFTs 2/10 CT chest - mild air space disease on rt (resolving) Swallow evaln - mild pharyngeal dysphagia low IgM level, hypersens panel neg, ANA neg, RA mild pos, ESR 12 -Bscopy 11/26 pseudomonas  Review of Systems       The patient complains of dyspnea on exertion.  The patient denies anorexia, fever, weight loss, weight gain, vision loss, decreased hearing, hoarseness, chest pain, syncope, peripheral edema, prolonged cough, headaches, hemoptysis, abdominal pain, melena, hematochezia, severe indigestion/heartburn, hematuria, muscle weakness, suspicious skin lesions, difficulty walking, depression, unusual weight change, and abnormal bleeding.    Vital Signs:  Patient profile:   59 year old female Height:      65.5 inches Weight:      130.25 pounds O2 Sat:      93 % on Room air Temp:     98.3 degrees F oral Pulse rate:   80 / minute BP sitting:   136 / 90  (right arm) Cuff size:   regular  Vitals Entered By: Zackery Barefoot CMA (June 05, 2009 4:21 PM)  O2 Flow:  Room air CC: Pt c/o feeling tired, sweats, headaches, nausea,  productive cough with green mucus, right back pressure, and fever of  T-101.4 on Friday Comments Medications reviewed with patient Verified pt's contact number Zackery Barefoot CMA  June 05, 2009 4:28 PM    Physical Exam  Additional Exam:  Gen: ,thin , anxious woman, in no distress  ENT: no lesions, no post nasal drip, no sinus tenderness Neck: No JVD, no TMG, no carotid bruits Lungs: No use of accessory muscles, no dullness to percussion,  clear to auscultation Cardiovascular: RRR, heart sounds normal, no murmurs or gallops, no peripheral edema Musculoskeletal: No deformities, no cyanosis or clubbing      CXR  Procedure date:   06/05/2009  Findings:      IMPRESSION: New right lung air space opacities suspicious for pneumonia.  These should be followed radiographic clearing.  Impression & Recommendations:  Problem # 1:  PNEUMONIA, RECURRENT (ICD-486) ct cipro x 10 ds Last organism was sensitive pseudomonas - I am afraid of this getting resistant to repeated cipro dosing. If no clinical response by end of this week, she may require admission for IV ABx Ongoing wu by ID Her updated medication list for this problem includes:    Cipro 750 Mg Tabs (Ciprofloxacin hcl) .[name] Kitchen..[name] Kitchen Two times a day  Orders: T-2 View CXR (71020TC) Est. Patient Level III (04540) Prescription Created Electronically [phone])  Medications Added to Medication List This Visit: 1)  Mucinex Maximum Strength 1200 Mg Xr12h-tab (Guaifenesin) .... Take 1 tablet by mouth two times a day 2)  Morphine Sulfate Cr 30 Mg Xr12h-tab (Morphine sulfate) .... As directed 3)  Cipro 750 Mg Tabs (Ciprofloxacin hcl) .... Two times a day  Patient Instructions: 1)  Copy sent to: Dr Synthia Innocent, Dr Ninfa Meeker, Dr Carolyne Fiscal 2)  Please schedule a follow-up appointment in 3 months. 3)  A chest x-ray has been recommended.  Your imaging study may require preauthorization.  Prescriptions: CIPRO 750 MG TABS (CIPROFLOXACIN HCL) two times a day  #20 x 1   Entered and Authorized by:   Comer Locket. Vassie Loll MD   Signed by:   Comer Locket Vassie Loll MD on 06/05/2009   Method used:   Electronically to        [facility]* (retail)       [address]       [place], [place]  [address]       Ph: [phone]       Fax: [phone]   RxID:   [id number]     Appended Document: ? pna/apc Kathy Breach, I corresponded with Verdene Rio with [facility] about this lady. He agreed with all of the testing we have done so far. His only further recommendation re testing would be to send a complete sequencing for all CF related genes to a specific reference lab, looking in particular for genes  associated with ciliary dyskinesia.He also mentioned a DOck8 mutation associated with CVID associated with low IgM but those folks tend to present earlier in life than this lady.  He recommended the [facility] group who are well known for expertise in CF, and other ciliary disorders. I know several of those MDs, such as Peader Noone, Georgina Quint and I would be happy to plug her in there for testing etc as well. I suppose chronic aspiration could be in play but somehow that doesnt fit very well to me. Holland recommended chronic suppressive antibiotics with either DS bactrim one tablet twice daily vs a quinolone. I would be open to either option. Certainly a quinlone would better cover her pseudomonas that she has grown, though we may end up selecting for resistance in the long run with chronic antibiotics, I think given frequency of recurrence this is way to go for now.I would like to plug her back to see me again so I can send off rest of CF sequencing labs, give her pneumovax and tetanus vaccines for purposes of checking repeat titers and to start her on a course of chronic suppressive therapy.

## 2010-05-29 NOTE — Progress Notes (Signed)
Summary: returning call  Phone Note Call from Patient Call back at Home Phone [phone]   Caller: Patient Call For: parrett Summary of Call: Returning TP call also wants TP to know that she took sputum sample to lab this am. Initial call taken by: Darletta Moll,  August 22, 2009 11:39 AM  Follow-up for Phone Call        per EMR, looks like TP called pt this morning regarding cxr results.  Will forward message to TP to adress.  Arman Filter LPN  August 22, 2009 11:42 AM   called spoke with patient.  advised of cxr results.  pt verbalized her understanding.  informed pt that TP is aware that she left a sputum samples.   Boone Master CNA  August 22, 2009 2:27 PM

## 2010-05-29 NOTE — Miscellaneous (Signed)
Summary: Appointment Canceled  Appointment status changed to canceled by [employer] on 08/02/2009 2:25 PM.  Cancellation Comments --------------------- 1 MONTH F/U/CH  Appointment Information ----------------------- Appt Type:  ID OFFICE VISIT      Date:  Monday, August 07, 2009      Time:  9:15 AM for 15 min   Urgency:  Routine   Made By:  Pearson Grippe  To Visit:  [id number]    Reason:  1 MONTH F/U/CH  Appt Comments ------------- -- 08/02/09 14:25: (CEMR) CANCELED -- 1 MONTH F/U/CH -- 07/21/09 10:53: (CEMR) BOOKED -- Routine ID OFFICE VISIT at 08/07/2009 9:15 AM for 15 min 1 MONTH F/U/CH

## 2010-05-29 NOTE — Assessment & Plan Note (Signed)
Summary: F/U/VS   Referring Provider:  Dr. Joetta Manners Primary Provider:  Dr. Joetta Manners  CC:  1 month follow up.  History of Present Illness: 60yo lady who developed a severe flulike illness in the fall of 2008.  She says it took her several months to recover.  She was treated for acute bronchitis in the spring the with Zithromax. She was given a dx of COPD at that time (which she feels is a mis-diagnosis)  She developed chest pain and shortness of breath in late June 2009, and on  CT found to have a  large right loculated pleural effusion.  She was admitted to [facility] 10[facility].  A chest tube  placed on 7/ 2/09 revealing a large amount of" chocolate milk" fluid.  The culture of that fluid grew Streptococcus intermedius.  She was treated with IV Zosyn while hospitalized, and t discharged home on November 05 2007 to complete a short course of oral Augmentin and Levaquin. After recovery she then felt poorly again  was seen by Dr. Blenda Nicely. She was told that she had recurrent pneumonia. Repeat chest CT scan from November 28, 2007 showed  new right-sided infiltrates.  She was treated again with Levaquin and improved.  She got worse again in early September, 2009 and was readmitted to the hospital on September 15 with shortness of breath.  a sputum culture on September 15 grew Klebsiella pneumoniae. A CT scan at that time showed that the right-sided infiltrates and small mediastinal and hilar nodes had improved. She had , bscopy non diagnostic incl afb, fungal,  She had further documented recurrences in 12/09 levaquin), 1/10, 2/10 (cipro), 6/10 (cipro) 10/10 (cipro). She had repeat CT this fall 2010 and it again showed recurrence of right sided infiltrates. She underwent bronchoscopy with BAL and biopsies by Dr. Vassie Loll in late November. HE encoutered minimal white phlegm in lower left airways and did BAL and biopsies from RLL and RML. BAL grew >100K pseudomonas aeruginosa S to all anti pseudomonal abx (including  cipro). Biospy showed some antrhracotic pigment and granulamotous inflammation. She has had fairly extensive evaluation of her immune system done and so far only found to have isolated IgM that was low. Immunoglobins were otherwise normal, IgE slightly elevated. complement, neutrophil burst were normal. I sent off testing for cystic fibrosis gene which was negative. We checked antiboides to pneumococcus and tetanus as pre-vaccination titers and will vaccinate and check post-vacciantion titers though CVID seems unlikely. I discussed case with Vira Agar who recommended referral to [facility] also sequencing for entire CF gene via [facility] and considering using antibiotics prophylactically. I have d/w Dr. Vassie Loll here at [facility] and he raised concern which I share for developmen tof resistance adn therefore we elected to treat her with symptom triggered therapy to minimize risk of R developing in her pseudomonas which she continues to grow. She has been treated for yet another flare of pneumonia with cipro 750 two times a day for 10 days. She identifies cycle of pain that causes her to be bed bound behind why she gets recurrent pneumonias.  Preventive Screening-Counseling & Management  Alcohol-Tobacco     Alcohol drinks/day: 0     Smoking Status: never     Year Quit: 2006  Caffeine-Diet-Exercise     Caffeine use/day: coffee     Does Patient Exercise: yes     Type of exercise: rehab     Exercise (avg: min/session): <30     Times/week: 6  Comments:  Patient stated that she have been feeling depressed for several months. Frustrated with back pain. Hard to drive, patient states that it hurts back.   Safety-Violence-Falls     Seat Belt Use: yes   Current Allergies (reviewed today): ! * OXYCONTIN Past History:  Past Medical History: Last updated: 05/22/2009 Anxiety Depression Headache Hypertension Hypothyroidism Osteoarthritis Urinary incontinence Dyspnea     - PFT's completely nl  06/15/2008 Recurrent pneumonias  after empyema 7/09 ([place])       - Chest CT [place]:  resolving air space dz rul, rll and shrinking nodes 01/11/08 vs 12/08/07       - Sinus CT neg July 15, 2008  CT 11/4/210 sinuses without acute disease CT"" LUng with bilateral airspace disease predominantly involving the RLL Sp BAL and biopsies on Bronchoscopy with >100K pseudomonas and bx with anthracosis and chronic granulatmous infection  Past Surgical History: Last updated: 04/18/2009 achilles tendon repair Lumbar laminectomy Lumbar fusion Tubal ligation Nerve stimulator insertion and subsequent removal Lumbar hernia repair 01/04/09 [facility]  Family History: Last updated: 10/17/2008 Breast CA- Mother Heart dz- Father neg resp dz  Social History: 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 Former smoker.  Quit in 2006.  Smoked for approx 15 years up to 1 ppd.MarriedChildren. No exposure to dusts aerosols. LIves in suburban house in [place]. They have two pet dogs.  Risk Factors: Alcohol Use: 0 (06/29/2009) Caffeine Use: coffee (06/29/2009) Exercise: yes (06/29/2009)  Risk Factors: Smoking Status: never (06/29/2009)  Review of Systems  The patient denies anorexia, fever, weight loss, weight gain, vision loss, decreased hearing, hoarseness, chest pain, syncope, dyspnea on exertion, peripheral edema, prolonged cough, headaches, hemoptysis, abdominal pain, melena, hematochezia, severe indigestion/heartburn, hematuria, incontinence, genital sores, muscle weakness, suspicious skin lesions, transient blindness, difficulty walking, depression, unusual weight change, abnormal bleeding, enlarged lymph nodes, and angioedema.    Vital Signs:  Patient profile:   60 year old female Height:      65.5 inches (166.37 cm) Weight:      127.8 pounds (58.09 kg) BMI:     21.02 Temp:     98.3 degrees F (36.83 degrees C) oral Pulse  rate:   65 / minute BP sitting:   112 / 69  (left arm)  Vitals Entered By: Baxter Hire) (June 29, 2009 9:48 AM) CC: 1 month follow up Pain Assessment Patient in pain? yes     Location: upper back Intensity: 8 Type: burning/aching Onset of pain  Constant Nutritional Status BMI of 19 -24 = normal Nutritional Status Detail appetite is good per patient  Does patient need assistance? Functional Status Self care Ambulation Normal   Physical Exam  General:  alert.  thin standing upright through most of the interview Head:  normocephalic, atraumatic, and no abnormalities observed.  no maxillary sinus tenderness Eyes:  vision grossly intact, pupils equal, pupils round,  Ears:  no external deformities and ear piercing(s) noted.   Mouth:  pharynx pink and moist, no erythema, and no exudates.   Lungs:  slightly prolonged expiratory phase without wheezes rhonchi or rales Heart:  normal rate, regular rhythm, no murmur, and no gallop.   Abdomen:  soft, non-tender, and no distention.   Msk:  normal ROM.   Extremities:  No clubbing, cyanosis, edema, or deformity noted with normal full range of motion of all joints.   Neurologic:  alert & oriented X3, strength normal in all extremities, and gait normal.  Skin:  turgor normal and no rashes.   Psych:  Oriented X3, memory intact for recent and remote, normally interactive,  and moderately anxious.     Impression & Recommendations:  Problem # 1:  PNEUMONIA, RECURRENT (ICD-486)  WE have worked her up rather exhaustively. We offered testing at [facility] but the cost was giong to be 2.6k pre-insurance and she didnot wish to hazard a large copay. I will vaccinate her for pneumococcus and DPT and recheck titers to these in 4 wks. Will use strategy of symptom triggered rx with cipro. Concern for R pseudomonas is certainly high. She refused referral to [facility]--too long a drive. Certainly doing whatever she can to improve her ventilation including  deep breathing, incentive spirometer when bed bound, better pain control, other mechanical interventions. If pseudomonas remains and gets FQ R we will end up having to use pareneteral therapy to rx her recurrences. Her updated medication list for this problem includes:    Cipro 750 Mg Tabs (Ciprofloxacin hcl) .[name] Kitchen... Take 1 tablet by mouth two times a day    Her updated medication list for this problem includes:    Cipro 750 Mg Tabs (Ciprofloxacin hcl) .[name] Kitchen... Take 1 tablet by mouth two times a day  Orders: Est. Patient Level IV ([address]109)  Problem # 2:  CHRONIC PAIN SYNDROME (ICD-338.4)  see above discussion she believes and she may be right that her inability to tolerate her pain and her being bed bound contributing to her recurrent pulnonary infections, her narcotics may also be playing a role though my understanding was that part of inhibition of ventilation was attenuated with chronic narcotic use.  Orders: Est. Patient Level IV (60454)  Problem # 3:  HYPERTENSION (ICD-401.9) Normotensive today Her updated medication list for this problem includes:    Bystolic 5 Mg Tabs (Nebivolol hcl) .[name] Kitchen... 1/2 once daily  BP today: 112/69 Prior BP: 90/68 (06/14/2009)  Labs Reviewed: K+: 5.5 (04/18/2009) Creat: : 1.04 (04/18/2009)     Problem # 4:  SINUSITIS, RECURRENT (ICD-473.9)  see discussion of #1 Her updated medication list for this problem includes:    Mucinex Maximum Strength 1200 Mg Xr12h-tab (Guaifenesin) .[name] Kitchen... Take 1 tablet by mouth two times a day    Cipro 750 Mg Tabs (Ciprofloxacin hcl) .[name] Kitchen... Take 1 tablet by mouth two times a day  Orders: Est. Patient Level IV (09811)  Other Orders: Tdap => 65yrs IM [phone]) Admin 1st Vaccine (29562) Admin 1st Vaccine [facility]) [phone]) Pneumococcal Vaccine (78469) Admin of Any Addtl Vaccine (62952) Admin of Any Addtl Vaccine [facility] [phone])  Patient Instructions: 1)  Please make appt to come back and see Dr. Daiva Eves in 4 wks so we can  recheck  your antibody response to the vaccines we gave  today 2)  The pneumonia you have been having over and over has been with pseudmonas Prescriptions: CIPRO 750 MG TABS (CIPROFLOXACIN HCL) Take 1 tablet by mouth two times a day  #20 x 6   Entered and Authorized by:   Acey Lav MD   Signed by:   Paulette Blanch Dam MD on 06/29/2009   Method used:   Electronically to        [facility]* (retail)       [address]       [place], [place]  [address]       Ph: [phone]       Fax: [phone]   RxID:   [id number]  Tetanus/Td Vaccine    Vaccine Type: Tdap    Site: left deltoid    Mfr: GlaxoSmithKline    Dose: 0.5 ml    Route: IM    Given by: Jennet Maduro RN    Exp. Date: 06/24/2011    Lot #: [id number]  Pneumovax Vaccine    Vaccine Type: Pneumovax    Site: right deltoid    Mfr: Merck    Dose: 0.5 ml    Route: IM    Given by: Jennet Maduro RN    Exp. Date: 12/02/2010    Lot #: [id number]

## 2010-05-29 NOTE — Assessment & Plan Note (Signed)
Summary: f/u 2-3 wks ///kp   Visit Type:  Follow-up Copy to:  Dr. Joetta Manners Primary Provider/Referring Provider:  Abner Greenspan  CC:  Pt here for follow up. Pt states breathing is the same and no new complaints. Pt requesting refills on Cipro.  History of Present Illness: 18 yowf quit smoking 2006,  for FU of  recurrent  Rt sided pneumonias starting 2008.  Has taken 3 rounds of ABx in 2011 so far.  Sep 15, 2009 2:10 PM  hospitalised 5/11-16 for extensive rt pneumonia & hypoxia - IV cefepime & vanc via PICC x 14 ds + Po cipro . Discussed high morphine dosing with dr Cherylann Banas --& xanax dosing with dr Raynaldo Opitz. Have informed dr Carolyne Fiscal of present situation. swallow study showed intermittent penetration of thin and nectar thick liquids. speech therapy recomm - eat and drink only when alert, seat at upright at 90 degrees, small bites and sips and intermittently clear her throat post swallowing.  O2 helps, cxr today shows persistent rt infx extensive  November 20, 2009 2:33 PM  husband diagnosed with advanced lung CA.  She is getting stronger, is trying to decrease pain meds after another spinal injection.. Denies chest pain,  orthopnea, hemoptysis, fever, n/v/d, edema, headache. PICC & O2 d/c'd. FU CXR showed improvement in rt infx  Preventive Screening-Counseling & Management  Alcohol-Tobacco     Smoking Status: quit     Packs/Day: 1.5     Year Started: 2000     Year Quit: 2006  Current Medications (verified): 1)  Bystolic 5 Mg Tabs (Nebivolol Hcl) .... 1/2 Once Daily 2)  Bayer Aspirin 325 Mg Tabs (Aspirin) .... One Daily 3)  Baclofen 20 Mg Tabs (Baclofen) .[name] Kitchen.. 1 Every 8 Hours 4)  Amitriptyline Hcl 25 Mg Tabs (Amitriptyline Hcl) .[name] Kitchen.. 1 Once Daily 5)  Levothroid 112 Mcg Tabs (Levothyroxine Sodium) .... Once Daily 6)  Mirtazapine 45 Mg Tabs (Mirtazapine) .[name] Kitchen.. 1 Once Daily 7)  Gabapentin 600 Mg Tabs (Gabapentin) .[name] Kitchen.. 1 Every 8 Hours 8)  Alprazolam 1 Mg Tabs (Alprazolam) .... 1/2 Tab By Mouth  Three Times A Day As Needed 9)  Vesicare 5 Mg Tabs (Solifenacin Succinate) .[name] Kitchen.. 1 Once Daily 10)  Endocet 10-325 Mg Tabs (Oxycodone-Acetaminophen) .... Take 1 To 2  Tablet By Mouth Three Times A Day 11)  Naftin 1 % Gel (Naftifine Hcl) .... As Directed 12)  Proventil Hfa 108 (90 Base) Mcg/act Aers (Albuterol Sulfate) .... 2 Puffs Every 4 Hours As Needed 13)  Mucinex Maximum Strength 1200 Mg Xr12h-Tab (Guaifenesin) .... Take 1 Tablet By Mouth Two Times A Day 14)  Morphine Sulfate Cr 30 Mg Xr12h-Tab (Morphine Sulfate) .... 2 Tabs By Mouth Every 8 Hours As Needed 15)  Trimethoprim 100 Mg Tabs (Trimethoprim) .... Take 1 Tab By Mouth At Bedtime 16)  Cipro 750 Mg Tabs (Ciprofloxacin Hcl) .... Take 1 Tablet By Mouth Two Times A Day 17)  Tylenol Extra Strength 500 Mg Tabs (Acetaminophen) .... Take 1 Tablet By Mouth Every4 To 6 Hours For Fever As Needed  Allergies (verified): 1)  ! * Oxycontin  Past History:  Past Medical History: Last updated: 05/22/2009 Anxiety Depression Headache Hypertension Hypothyroidism Osteoarthritis Urinary incontinence Dyspnea     - PFT's completely nl 06/15/2008 Recurrent pneumonias  after empyema 7/09 ([place])       - Chest CT [place]:  resolving air space dz rul, rll and shrinking nodes 01/11/08 vs 12/08/07       - Sinus CT neg July 15, 2008  CT 11/4/210 sinuses without acute disease CT"" LUng with bilateral airspace disease predominantly involving the RLL Sp BAL and biopsies on Bronchoscopy with >100K pseudomonas and bx with anthracosis and chronic granulatmous infection  Social History: 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 Former smoker.  Quit in 2006.  Smoked for approx 15 years up to 1 ppd.MarriedChildren. No exposure to dusts aerosols. LIves in suburban house in [place]. They have two pet dogs.  Past Pulmonary History:  Pulmonary History: WU so far has included  CBC  without eos.  -CT chest 9/09 [facility]:  resolving air space dz rul, rll and shrinking nodes 01/11/08 vs 12/08/07  - Sinus CT neg July 15, 2008  - Nml PFTs 2/10 CT chest - mild air space disease on rt (resolving) Swallow evaln - mild pharyngeal dysphagia low IgM level, hypersens panel neg, ANA neg, RA mild pos, ESR 12 -Bscopy 11/26 pseudomonas  She has chronic pain since 2006 requiring spinal cord stimulator and nl pft's documented 06/15/2008.  10/2007 right chest tube for strep intermedius empyema,  9/09 >> klebs pneumonia in sputum cx , bscopy non diagnostic incl afb, fungal,  eval by Dr Orvan Falconer - ID.   Bscopy 03/24/09 >> minimal secretions, BAL >>pseudomonas S to cipro, afb/ fungal/PCP neg Recurrences in 12/09 levaquin), 1/10, 2/10 (cipro), 6/10 (cipro) , 10/10 (cipro), 12/10 (cipro ) &  2/11 (cipro). used macrodantin for UTI in 2/10 . hernia surgery 01/04/09   Extensive w/u by ID Daiva Eves ) noted. She has lost about 5 lbs in the last year. She denies dysphagia to solids/ liquids, recurrent sinusitis, asthma or recurrent infections as a child. Pain meds being managed by Dr Ninfa Meeker, stimulator inserted by Dr Channing Mutters. 1/11 s/p steroid injection in lower thoracic nerves   pneumovax 10/09. Albuterol causes tremors. October 10, 2009--O2 started at last hospital discharge in May, no desats w/ ambulation, O2 d/c   Social History: Packs/Day:  1.5  Review of Systems       The patient complains of dyspnea on exertion.  The patient denies anorexia, fever, weight loss, weight gain, vision loss, decreased hearing, hoarseness, chest pain, syncope, peripheral edema, prolonged cough, headaches, hemoptysis, abdominal pain, melena, hematochezia, severe indigestion/heartburn, hematuria, muscle weakness, suspicious skin lesions, transient blindness, difficulty walking, depression, unusual weight change, and abnormal bleeding.    Vital Signs:  Patient profile:   60 year old female Height:      65.5 inches Weight:       130.8 pounds BMI:     21.51 O2 Sat:      97 % on Room air Temp:     98.0 degrees F oral Pulse rate:   58 / minute BP sitting:   102 / 72  (left arm) Cuff size:   regular  Vitals Entered By: Zackery Barefoot CMA (November 20, 2009 2:06 PM)  O2 Flow:  Room air CC: Pt here for follow up. Pt states breathing is the same, no new complaints. Pt requesting refills on Cipro Comments Medications reviewed with patient Verified contact number and pharmacy with patient Zackery Barefoot CMA  November 20, 2009 2:07 PM    Physical Exam  Additional Exam:  Gen: ,thin , anxious woman, in no distress  ENT: no lesions, no post nasal drip, no sinus tenderness Neck: No JVD, no TMG, no carotid bruits Lungs: No use of accessory muscles, no dullness to percussion,   Cardiovascular: RRR, heart sounds normal, no murmurs or gallops,  no peripheral edema Neuro - non focal Musculoskeletal: No deformities, no cyanosis or clubbing      Impression & Recommendations:  Problem # 1:  PNEUMONIA, RECURRENT (ICD-486)  discussed plan for recurrence. Concern here of course is for anti-microbial resistance. She may go to her PMD for xrays if she is unable to come to [place]. Chk alpha 1 levels for completion The following medications were removed from the medication list:    Cipro 750 Mg Tabs (Ciprofloxacin hcl) .[name] Kitchen... Take 1 tablet by mouth two times a day Her updated medication list for this problem includes:    Trimethoprim 100 Mg Tabs (Trimethoprim) .[name] Kitchen... Take 1 tab by mouth at bedtime    Ciprofloxacin Hcl 750 Mg Tabs (Ciprofloxacin hcl) .[name] Kitchen..[name] Kitchen Two times a day  Orders: Est. Patient Level IV (52841)  Problem # 2:  CHRONIC PAIN SYNDROME (ICD-338.4) She clearly seems to do better if pain is better controlled. I am concerned that her husband who is her main support system will be incapacitated during his chemoRx treatment.  Medications Added to Medication List This Visit: 1)  Ciprofloxacin Hcl 750 Mg Tabs (Ciprofloxacin  hcl) .... Two times a day  Patient Instructions: 1)  Copy sent to: Dr Yetta Flock 2)  Please schedule a follow-up appointment in 4 months. 3)  we discussed plan should you develop symptoms of pneumonia -fever, cough, shortness of breath 4)  Cipro Rx has ben sent to pharmacy 5)  Blood owrk today for enzymes deficiency in lung Prescriptions: CIPROFLOXACIN HCL 750 MG TABS (CIPROFLOXACIN HCL) two times a day  #20 x 0   Entered and Authorized by:   Comer Locket Vassie Loll MD   Signed by:   Comer Locket Vassie Loll MD on 11/20/2009   Method used:   Electronically to        [facility]* (retail)       [address]       [place], [place]  [address]       Ph: [phone]       Fax: [phone]   RxID:   [id number]

## 2010-05-29 NOTE — Progress Notes (Signed)
Summary: talk to nurse  Phone Note Call from Patient Call back at Home Phone [phone]   Caller: Patient Call For: alva Reason for Call: Talk to Nurse Summary of Call: Pt states she thinks her pneumonia has returned, temp 100.2 today, pls advise.//[facility] Initial call taken by: Darletta Moll,  October 19, 2009 10:08 AM  Follow-up for Phone Call        pt states she is on a antibiotic and would like to see how she feels if no better by next week she will call for appt-advised pt to call back if she decided to be seen also told her if she worsens to call us back or go to the er--pt verbalized understanding Follow-up by: Philipp Deputy CMA,  October 19, 2009 11:15 AM

## 2010-05-29 NOTE — Miscellaneous (Signed)
Summary: Plan/[facility]  Plan/[facility]   Imported By: Lester Clearview 11/07/2009 10:55:50  _____________________________________________________________________  External Attachment:    Type:   Image     Comment:   External Document

## 2010-05-29 NOTE — Progress Notes (Signed)
Summary: Letter for oxygen tank/[facility]  Letter for oxygen tank/[facility]   Imported By: Sherian Rein 09/11/2009 07:12:23  _____________________________________________________________________  External Attachment:    Type:   Image     Comment:   External Document

## 2010-05-29 NOTE — Progress Notes (Signed)
Summary: fyi  Phone Note Call from Patient Call back at [phone]   Caller: Spouse Call For: Waleed Dettman Summary of Call: calling to give the names of drs that pt see: dr Delene Loll [phone] Dr Herbert Seta Spry [phone] and Dr Ladell Heads  Initial call taken by: Rickard Patience,  Sep 13, 2009 9:36 AM  Follow-up for Phone Call        Dr. Vassie Loll is this something you have requested of patient? Zackery Barefoot CMA  Sep 13, 2009 9:47 AM    Have d/w Dr Claudie Revering & drmeloy's partner - dr Laural Benes to decrease her pain meds & psychotropic meds Follow-up by: Comer Locket. Vassie Loll MD,  Sep 14, 2009 1:21 PM

## 2010-05-29 NOTE — Assessment & Plan Note (Signed)
Summary: follow up with [name]   Visit Type:  Follow-up Copy to:  Dr. Joetta Manners Primary Provider/Referring Provider:  Dr. Joetta Manners  CC:  Pt here for follow up with x-ray. Pt states she feels better. Pt c/o productive cough with small amount of green mucus and and PND.  History of Present Illness: 60 yowf quit smoking 2006,  for FU of  recurrent  Rt sided pneumonias starting 2008.  She has chronic pain since 2006 requiring spinal cord stimulator and nl pft's documented 06/15/2008.  10/2007 right chest tube for strep intermedius empyema,  9/09 >> klebs pneumonia in sputum cx , bscopy non diagnostic incl afb, fungal,  eval by Dr Orvan Falconer - ID.   Bscopy 11/26 >> minimal secretions, BAL >>pseudomonas S to cipro, afb/ fungal/PCP neg Recurrences in 12/09 levaquin), 1/10, 2/10 (cipro), 6/10 (cipro) , 10/10 (cipro), 12/10 (cipro ) & most recently 2/11 (cipro). used macrodantin for UTI in 2/10 . hernia surgery 01/04/09  > ov 02/03/09 by Dr Delford Field, dx right pna, rx cipro x 10 days.  Extensive w/u by ID Daiva Eves ) noted. She has lost about 5 lbs in the last year. She denies dysphagia to solids/ liquids, recurrent sinusitis, asthma or recurrent infections as a child. Pain meds being managed by Dr Ninfa Meeker, stimulator inserted by Dr Channing Mutters. 1/11 s/p steroid injection in lower thoracic nerves  - Dr Ninfa Meeker to try & cut back on narcotics pneumovax 10/09. Albuterol causes tremors.  June 14, 2009 4:59 PM  Feels better, CXR - pna resolved. Just had her first grandchild. Pain worse on car ride. Discussed problems with daily suppressive Abx , favor intermitent based on symptoms.  Current Medications (verified): 1)  Bystolic 5 Mg Tabs (Nebivolol Hcl) .... 1/2 Once Daily 2)  Bayer Aspirin 325 Mg Tabs (Aspirin) .... One Daily 3)  Baclofen 20 Mg Tabs (Baclofen) .[name] Kitchen.. 1 Every 8 Hours 4)  Amitriptyline Hcl 25 Mg Tabs (Amitriptyline Hcl) .[name] Kitchen.. 1 Once Daily 5)  Levothroid 112 Mcg Tabs (Levothyroxine Sodium) .... Once  Daily 6)  Mirtazapine 45 Mg Tabs (Mirtazapine) .[name] Kitchen.. 1 Once Daily 7)  Gabapentin 600 Mg Tabs (Gabapentin) .[name] Kitchen.. 1 Every 8 Hours 8)  Alprazolam 1 Mg Tabs (Alprazolam) .[name] Kitchen.. 1 Four Times A Day As Needed 9)  Vesicare 5 Mg Tabs (Solifenacin Succinate) .[name] Kitchen.. 1 Once Daily 10)  Endocet 10-325 Mg Tabs (Oxycodone-Acetaminophen) .[name] Kitchen.. 1 Every 6 Hours As Needed 11)  Naftin 1 % Gel (Naftifine Hcl) .... As Directed 12)  Xyzal 5 Mg Tabs (Levocetirizine Dihydrochloride) .[name] Kitchen.. 1 Once Daily As Needed 13)  Proventil Hfa 108 (90 Base) Mcg/act Aers (Albuterol Sulfate) .... 2 Puffs Every 4 Hours As Needed 14)  Mucinex Maximum Strength 1200 Mg Xr12h-Tab (Guaifenesin) .... Take 1 Tablet By Mouth Two Times A Day 15)  Morphine Sulfate Cr 30 Mg Xr12h-Tab (Morphine Sulfate) .... As Directed 16)  Trimethoprm 100 Mg .[name] KitchenMarland Kitchen. 1 At Bedtime 17)  Cipro 750 Mg Tabs (Ciprofloxacin Hcl) .... Two Times A Day  Allergies (verified): 1)  ! * Oxycontin  Past History:  Past Medical History: Last updated: 05/22/2009 Anxiety Depression Headache Hypertension Hypothyroidism Osteoarthritis Urinary incontinence Dyspnea     - PFT's completely nl 06/15/2008 Recurrent pneumonias  after empyema 7/09 ([place])       - Chest CT [place]:  resolving air space dz rul, rll and shrinking nodes 01/11/08 vs 12/08/07       - Sinus CT neg July 15, 2008  CT 11/4/210 sinuses without acute disease CT"" LUng  with bilateral airspace disease predominantly involving the RLL Sp BAL and biopsies on Bronchoscopy with >100K pseudomonas and bx with anthracosis and chronic granulatmous infection  Social History: 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 Former smoker.  Quit in 2006.  Smoked for approx 15 years up to 1 ppd.MarriedChildren. No exposure to dusts aerosols. LIves in suburban house in [place]. They have two pet dogs.  Past Pulmonary History:  Pulmonary History: WU so far  has included  CBC without eos.  -CT chest 9/09 [facility]:  resolving air space dz rul, rll and shrinking nodes 01/11/08 vs 12/08/07  - Sinus CT neg July 15, 2008  - Nml PFTs 2/10 CT chest - mild air space disease on rt (resolving) Swallow evaln - mild pharyngeal dysphagia low IgM level, hypersens panel neg, ANA neg, RA mild pos, ESR 12 -Bscopy 11/26 pseudomonas  Review of Systems  The patient denies anorexia, fever, weight loss, weight gain, vision loss, decreased hearing, hoarseness, chest pain, syncope, dyspnea on exertion, peripheral edema, prolonged cough, headaches, hemoptysis, abdominal pain, melena, hematochezia, severe indigestion/heartburn, hematuria, incontinence, suspicious skin lesions, difficulty walking, depression, unusual weight change, and abnormal bleeding.    Vital Signs:  Patient profile:   60 year old female Height:      65.5 inches Weight:      132.13 pounds O2 Sat:      98 % on Room air Temp:     99.0 degrees F oral Pulse rate:   70 / minute BP sitting:   90 / 68  (left arm) Cuff size:   regular  Vitals Entered By: Zackery Barefoot CMA (June 14, 2009 3:49 PM)  O2 Flow:  Room air CC: Pt here for follow up with x-ray. Pt states she feels better. Pt c/o productive cough with small amount of green mucus, and PND Comments Medications reviewed with patient Verified pt's contact number Zackery Barefoot CMA  June 14, 2009 3:50 PM    Physical Exam  Additional Exam:  Gen: ,thin , anxious woman, in no distress  ENT: no lesions, no post nasal drip, no sinus tenderness Neck: No JVD, no TMG, no carotid bruits Lungs: No use of accessory muscles, no dullness to percussion,  clear to auscultation Cardiovascular: RRR, heart sounds normal, no murmurs or gallops, no peripheral edema Musculoskeletal: No deformities, no cyanosis or clubbing      CXR  Procedure date:  06/14/2009  Findings:      IMPRESSION: Clearing of pneumonia throughout the right lung.   Peribronchial thickening may represent bronchitis.  Impression & Recommendations:  Problem # 1:  PNEUMONIA, RECURRENT (ICD-486)  Daily ABx vs intermittent. Fears include -colitis &  develoment of resistance (I am surprised she still responds to cipro ) - note pseudomonas in last bronch. We decided to pursue intermittent aproach based on symptoms. If does not respond in 3 days, she will ccontact me or Dr Carolyne Fiscal - In this situation, will obtain sputum C & S for resistant pseudomonas may even need hospitalisation. Defer further WU to ID Her updated medication list for this problem includes:    Cipro 750 Mg Tabs (Ciprofloxacin hcl) .[name] Kitchen... Take 1 tablet by mouth two times a day  Orders: Est. Patient Level III ([address]) Prescription Created Electronically [phone])  Medications Added to Medication List This Visit: 1)  Cipro 750 Mg Tabs (Ciprofloxacin hcl) .... Take 1 tablet by mouth two times a day  Patient Instructions: 1)  Copy sent to:Dr  [facility] 2)  Please schedule a follow-up appointment in 3 months. 3)  Cipro Rx sent to pharmacy - start taking at the first signs of pneumonia. 4)  If no better in 3-4 days, call me or see Dr Carolyne Fiscal Prescriptions: CIPRO 750 MG TABS (CIPROFLOXACIN HCL) Take 1 tablet by mouth two times a day  #20 x 3   Entered by:   Zackery Barefoot CMA   Authorized by:   Comer Locket. Vassie Loll MD   Signed by:   Zackery Barefoot CMA on 06/15/2009   Method used:   Electronically to        [facility]* (retail)       [address]       [place], [place]  [address]       Ph: [phone]       Fax: [phone]   RxID:   [id number] CIPRO 750 MG TABS (CIPROFLOXACIN HCL) two times a day  #20 x 3   Entered and Authorized by:   Comer Locket. Vassie Loll MD   Signed by:   Comer Locket Vassie Loll MD on 06/14/2009   Method used:   Electronically to        [facility]* (retail)       [address]       [place], [place]  [address]       Ph: [phone]        Fax: [phone]   RxID:   [id number]

## 2010-05-29 NOTE — Op Note (Signed)
Summary: [facility]   Imported By: Sherian Rein 11/24/2009 13:15:15  _____________________________________________________________________  External Attachment:    Type:   Image     Comment:   External Document

## 2010-05-29 NOTE — Progress Notes (Signed)
Summary: order  Phone Note From Other Clinic   Caller: betty/[facility] Call For: alva Summary of Call: States pt has completed her antibiotics need order to pull her picc line. Initial call taken by: Darletta Moll,  Sep 26, 2009 8:28 AM  Follow-up for Phone Call        Dr. Vassie Loll please advise. Zackery Barefoot CMA  Sep 26, 2009 9:33 AM   pl ask her to wait until I review CXR - pl obtain CXR report from hospital & fax to me at [facility] [address] after 10 am to look at Follow-up by: Comer Locket. Vassie Loll MD,  Sep 26, 2009 5:16 PM  Additional Follow-up for Phone Call Additional follow up Details #1::        Pt informed. I will leave with Boone Master to send to RA in the AM, I will be out of the office. Zackery Barefoot CMA  Sep 26, 2009 5:36 PM   called 2100 for fax number: [phone] and informed secretary that i will be faxing report and to please pass it to Dr. Vassie Loll.  she verbalized her understanding.  cxr faxed - line busy.  will continue to wait for it to go through. Boone Master CNA/MA  September 27, 2009 10:27 AM     Additional Follow-up for Phone Call Additional follow up Details #2::    fax went through.  called 2100, spoke with Victorino Dike and asked her to verify that RA received the fax.  per Victorino Dike, RA did receive cxr report. Follow-up by: Boone Master CNA/MA,  September 27, 2009 11:12 AM

## 2010-07-16 LAB — VANCOMYCIN, TROUGH: Vancomycin Tr: 11.4 ug/mL (ref 10.0–20.0)

## 2010-07-17 LAB — BASIC METABOLIC PANEL
BUN: 4 mg/dL — ABNORMAL LOW (ref 6–23)
BUN: 9 mg/dL (ref 6–23)
CO2: 23 mEq/L (ref 19–32)
CO2: 26 mEq/L (ref 19–32)
CO2: 26 mEq/L (ref 19–32)
Calcium: 7.9 mg/dL — ABNORMAL LOW (ref 8.4–10.5)
Calcium: 8.3 mg/dL — ABNORMAL LOW (ref 8.4–10.5)
Calcium: 9.1 mg/dL (ref 8.4–10.5)
Chloride: 101 mEq/L (ref 96–112)
Chloride: 102 mEq/L (ref 96–112)
Chloride: 106 mEq/L (ref 96–112)
Chloride: 90 mEq/L — ABNORMAL LOW (ref 96–112)
Creatinine, Ser: 0.52 mg/dL (ref 0.4–1.2)
Creatinine, Ser: 0.66 mg/dL (ref 0.4–1.2)
Creatinine, Ser: 0.69 mg/dL (ref 0.4–1.2)
Creatinine, Ser: 0.8 mg/dL (ref 0.4–1.2)
GFR calc Af Amer: >60 mL/min (ref >60)
GFR calc Af Amer: >60 mL/min (ref >60)
GFR calc non Af Amer: >60 mL/min (ref >60)
Glucose, Bld: 94 mg/dL (ref 70–99)
Glucose, Bld: 95 mg/dL (ref 70–99)
Potassium: 4.6 mEq/L (ref 3.5–5.1)
Sodium: 132 mEq/L — ABNORMAL LOW (ref 135–145)
Sodium: 135 mEq/L (ref 135–145)

## 2010-07-17 LAB — CULTURE, BLOOD (ROUTINE X 2)

## 2010-07-17 LAB — CULTURE, RESPIRATORY W GRAM STAIN: Culture: NORMAL

## 2010-07-17 LAB — CBC
HCT: 32.8 % — ABNORMAL LOW (ref 36.0–46.0)
Hemoglobin: 10.4 g/dL — ABNORMAL LOW (ref 12.0–15.0)
Hemoglobin: 11.4 g/dL — ABNORMAL LOW (ref 12.0–15.0)
MCHC: 34.5 g/dL (ref 30.0–36.0)
MCHC: 34.7 g/dL (ref 30.0–36.0)
MCHC: 35.3 g/dL (ref 30.0–36.0)
MCV: 95.2 fL (ref 78.0–100.0)
MCV: 95.4 fL (ref 78.0–100.0)
MCV: 95.5 fL (ref 78.0–100.0)
Platelets: 209 10*3/uL (ref 150–400)
RBC: 3.11 MIL/uL — ABNORMAL LOW (ref 3.87–5.11)
RBC: 3.24 MIL/uL — ABNORMAL LOW (ref 3.87–5.11)
RBC: 3.44 MIL/uL — ABNORMAL LOW (ref 3.87–5.11)
RDW: 12.3 % (ref 11.5–15.5)
RDW: 13.3 % (ref 11.5–15.5)
WBC: 10.6 10*3/uL — ABNORMAL HIGH (ref 4.0–10.5)
WBC: 9.9 10*3/uL (ref 4.0–10.5)

## 2010-07-17 LAB — EXPECTORATED SPUTUM ASSESSMENT W GRAM STAIN, RFLX TO RESP C

## 2010-08-01 LAB — PNEUMOCYSTIS JIROVECI SMEAR BY DFA: Pneumocystis jiroveci Ag: NOT DETECTED

## 2010-08-01 LAB — FUNGUS CULTURE W SMEAR

## 2010-08-01 LAB — CULTURE, RESPIRATORY W GRAM STAIN

## 2010-08-01 LAB — LEGIONELLA PROFILE(CULTURE+DFA/SMEAR): Legionella Antigen (DFA): NEGATIVE

## 2010-08-01 LAB — AFB CULTURE WITH SMEAR (NOT AT ARMC)

## 2010-09-14 NOTE — Op Note (Signed)
NAMECHARMAYNE, [name]             ACCOUNT NO.:  [id number]   MEDICAL RECORD NO.:  [id number]          PATIENT TYPE:  AMB   LOCATION:  DSC                          FACILITY:  [facility]   PHYSICIAN:  Cindee Salt, M.D.       DATE OF BIRTH:  [birth year]   DATE OF PROCEDURE:  DATE OF DISCHARGE:                               OPERATIVE REPORT   PREOPERATIVE DIAGNOSIS:  Mucoid cyst, right middle finger.   POSTOPERATIVE DIAGNOSIS:  Mucoid cyst, right middle finger.   OPERATION:  Excision mucoid cyst, debridement of distal interphalangeal  joint, right middle finger.   SURGEON:  Cindee Salt, M.D.   ASSISTANT:  RN.   ANESTHESIA:  Forearm base IV regional.   HISTORY:  The patient is a 60 year old female with a large cyst which  has decompressed following opening distally with deformity of the nail  plate, right middle finger.  She has been treated with an antibiotic for  potential infection and is admitted now for excision of the cyst and  debridement of distal interphalangeal joint.  She is aware that there is  no guarantee with the surgery, possibility of infection, crush injury to  arteries, nerves, tendons, complete relief of symptoms, dystrophy, she  is aware of risks and benefits.  Questions have been encouraged and  answered in the preoperative area.  The extremity is marked with both  the patient and surgeon.   PROCEDURE:  The patient was brought to the operating room where a  forearm based IV regional anesthetic was carried out without difficulty.  She was prepped using DuraPrep in the supine position in a right arm  frame.  The extremity was draped after 3 minute dry time.  After  adequate anesthesia was afforded to the patient, a curvilinear incision  was made over the distal interphalangeal joint on middle finger carried  down through subcutaneous tissue.  Bleeders were electrocauterized.  The  distal skin was then lifted at supposedly a deflated cyst with a small  rongeur.  This  was removed taking care to protect the nail matrix.  The  joint was then opened both radially and ulnarly.  Osteophytes were  removed with a small rongeur.  Specimen was sent to pathology.  The  wound was then copiously irrigated with saline and closed with  interrupted 4-0 Vicryl repeat sutures.  A sterile compressive dressing  and splint applied to the finger was applied.  The patient tolerated the  procedure well and was taken to the recovery room for observation in  satisfactory condition.   She was discharged home, to return _______ services in [place] in 1  week on Vicodin.           ______________________________  Cindee Salt, M.D.     GK/MEDQ  D:  08/21/2006  T:  08/21/2006  Job:  [id number]

## 2010-09-14 NOTE — Op Note (Signed)
NAME:  [name], [name]                       ACCOUNT NO.:  [id number]   MEDICAL RECORD NO.:  [id number]                   PATIENT TYPE:  OIB   LOCATION:  3028                                 FACILITY:  [facility]   PHYSICIAN:  Payton Doughty, M.D.                   DATE OF BIRTH:  1951/02/29   DATE OF PROCEDURE:  09/14/2003  DATE OF DISCHARGE:                                 OPERATIVE REPORT   PREOPERATIVE DIAGNOSIS:  Nonfunctioning spinal cord stimulator.   POSTOPERATIVE DIAGNOSIS:  Nonfunctioning spinal cord stimulator.   PROCEDURE:  Exploration of spinal cord stimulator.   SURGEON:  Payton Doughty, M.D.   ASSISTANTBasilia Jumbo.   ANESTHESIA:  General endotracheal.   PREPARATION:  Sterile Betadine prep and scrub with alcohol wipe.   COMPLICATIONS:  Found erosion of a wire and electrode through the dura.   BODY OF TEXT:  This is a 60 year old right-handed white girl who had had a  spinal cord stimulator placed and been getting some dysesthesias in her  abdomen and chest.  It was planned to change out an electrode.  She was  taken to the operating room and smoothly anesthetized and intubated, placed  prone on the operating table.  Following shave, prep and drape in the usual  sterile fashion, the old skin incision was reopened.  Working to the lamina  of T11, the wires of the implanted spinal cord stimulator were identified.  Working along those wires and slightly enlarging the laminectomy of T12, the  wires were traced down to the dura.  Upon mobilization of the wire, the wire  on the left side had eroded in one spot through the dura.  This was closed  primarily with a 6-0 Prolene.  This was not extensively scarred to the dura.  It had simply eroded through it in one spot.  As the wires were mobilized  and the electrode was withdrawn, some CSF was produced from the removal  site.  Once again there was no scarring stuck to the electrode as it was  inspected.  The CSF production was  very momentary; however, it was felt that  because there was a dural opening it was unsafe to proceed with further  implantation of any electrode.  Therefore, the incision was irrigated and  hemostasis assured.  The electrode was cut off at the wires and the wires  had surgical clips placed on them.  They were still connected to the  receiver and it was felt that at a later date this would be a good pathway  for tunneling the wires, so they were left.  The paraspinous muscle and  fascia were reapproximated with 0 Vicryl in an interrupted fashion.  The  subcutaneous tissue was reapproximated with 2-0 Vicryl in interrupted  fashion, and the skin was closed with 3-0 nylon in running locked fashion.  Betadine and Telfa dressing was applied and made  occlusive with OpSite.  The  patient returned to the recovery room.                                               Payton Doughty, M.D.    MWR/MEDQ  D:  09/14/2003  T:  09/14/2003  Job:  [id number]

## 2010-09-14 NOTE — Op Note (Signed)
NAME:  [name], [name]                       ACCOUNT NO.:  [id number]   MEDICAL RECORD NO.:  [id number]                   PATIENT TYPE:  OIB   LOCATION:  3013                                 FACILITY:  [facility]   PHYSICIAN:  Payton Doughty, M.D.                   DATE OF BIRTH:  1951/04/24   DATE OF PROCEDURE:  04/12/2003  DATE OF DISCHARGE:                                 OPERATIVE REPORT   PREOPERATIVE DIAGNOSIS:  Implanted spinal cord electrode.   POSTOPERATIVE DIAGNOSIS:  Implanted spinal cord electrode.   OPERATION PERFORMED:  Implantation of spinal cord electrode receiver.   SURGEON:  Payton Doughty, M.D.   ASSISTANTBasilia Jumbo.   ANESTHESIA:  General endotracheal .   PREP:  Sterile Betadine prep and scrub with alcohol wipe.   COMPLICATIONS:  None.   INDICATIONS FOR PROCEDURE:  The patient is a 60 year old lady with  intractable pain who had a spinal cord electrode implanted last week, is  happy with the trial save for would like to have more of her back pain  covered.  She is now admitted for slight advancement of her electrode and  implanting of the receiver.   DESCRIPTION OF PROCEDURE:  The patient was taken to the operating room,  smoothly anesthetized, intubated, and placed  prone on the operating table.  Following shave, prep and drape in the usual sterile fashion the old sutures  had been removed from the thoracic incision.  It was reopened, the wires  recovered.  The extension wires were removed.  The electrode was advanced  approximately 1 cm so it stopped exactly at the T10-T11 junction.  It  previously had been extended from mid T11 to mid T9 and now extended from  the bottom of T10 to the bottom of T8. This was confirmed on intraoperative  x-ray.  Subcutaneous tunnel was then created to the area of the right  posterior superior iliac spine.  Through a transverse incision a  subcutaneous pocket was created for the ANS receiver.  The wires were passed  through the  tunnel. The wires connected to the receiver, secured there with  strain relief devices and tightened with a screw driver with the torque  screw driver.  There receiver was placed in the subcutaneous tunnel.  Both  sites were irrigated with bacitracin and saline.  The fascia was  reapproximated with 0 Vicryl in interrupted fashion.  The Scarpa's fascia  was reapproximated with 3-0 Vicryl interrupted fashion.  The skin was closed  with 3-0 nylon in a simple stitch.  Betadine Telfa dressing was applied and  made occlusive with Op-Site.  The patient returned to the recovery room in  good condition.  Payton Doughty, M.D.    MWR/MEDQ  D:  04/12/2003  T:  04/12/2003  Job:  [id number]

## 2010-09-14 NOTE — H&P (Signed)
NAME:  [name], Courtney A.                      ACCOUNT NO.:  [id number]   MEDICAL RECORD NO.:  [id number]                   PATIENT TYPE:  OIB   LOCATION:                                       FACILITY:  [facility]   PHYSICIAN:  Payton Doughty, M.D.                   DATE OF BIRTH:  [birth year]   DATE OF ADMISSION:  04/05/2003  DATE OF DISCHARGE:                                HISTORY & PHYSICAL   ADMISSION DIAGNOSES:  Intractable left leg pain.   HISTORY OF PRESENT ILLNESS:  This is a very nice 60 year old right-handed  white girl who has undergone two lumbar fusions, once in 1989 and once in  1999.  She has had increased back pain and left-sided leg pain.  She had  films that showed a nonunion at 4-5 and extreme left lateral placement of  left-sided cage in the L-4 neural foramen.  She has undergone stabilization  of the nonunion and still has intractable left leg pain and is now admitted  for placement of spinal cord stimulator.   PAST MEDICAL HISTORY:  Injury of right foot and Achilles tendon repair as an  adult with dehiscence and had skin graft.   MEDICATIONS:  1. Methadone 10 m a day.  2. Flexeril t.i.d.  3. Remeron 45 mg once a day.  4. Prozac 20 mg a day.  5. Xanax 0.5 mg 3 times a day.  6. Vitamin B.  7. Celebrex 200 mg once a day.  8. Levoxyl 100 mcg once a day.  9. Midrin for migraine.  10.      She is sensitive to OXYCONTIN but takes Percocet 4 times a day.   SOCIAL HISTORY:  She smokes a pack of cigarettes a day, does not drink  alcohol.  She is on disability.   FAMILY HISTORY:  Mom died of cancer. Daddy is 46 years old in good health  with heart disease.   REVIEW OF SYSTEMS:  Remarkable for glasses, chronic cough, back pain, leg  pain, joint pain, arthritis, anxiety, depression, thyroid disease.   PHYSICAL EXAMINATION:  HEENT:  Within normal limits.  NECK:  Good range of motion.  CHEST:  Diffuse crackles.  CARDIAC:  Regular rate and rhythm.  ABDOMEN:  Nontender  with no hepatosplenomegaly.  EXTREMITIES:  Without clubbing or cyanosis.  GU:  Exam deferred.  PERIPHERAL PULSES:  Good.  NEUROLOGIC:  Awake, alert, and oriented.  Cranial nerves II-XII intact.  Motor exam shows 5/5 strength throughout the upper extremities.  In the  lower extremities, she has plantar and dorsiflexion weakness in the right  foot related to the Achilles tendon injury.  Proximal leg musculature is  full strength.  On the left side, she has a bit of weakness in the  extensors.  Reflexes are 1 at the knees bilaterally and 1 at the left ankle.  Straight leg raise is negative.   LABORATORY  DATA:  MRI and plain films have demonstrated a nonunion which has  been corrected.  She also has arachnoiditis.   CLINICAL IMPRESSION:  Lumbar spondylosis with failed fusion at 4-5 with  intractable leg pain and arachnoiditis.   PLAN:  Spinal cord stimulator.  As her pain is mostly in her leg, will  probably try to place this stimulator across T10 and T9.  The risks and  benefits of this approach have been discussed with her, and she wishes to  proceed.                                                Payton Doughty, M.D.    MWR/MEDQ  D:  04/05/2003  T:  04/05/2003  Job:  [id number]

## 2010-09-14 NOTE — Op Note (Signed)
NAME:  [name], [name]                       ACCOUNT NO.:  [id number]   MEDICAL RECORD NO.:  [id number]                   PATIENT TYPE:  OIB   LOCATION:  3029                                 FACILITY:  [facility]   PHYSICIAN:  Payton Doughty, M.D.                   DATE OF BIRTH:  [birth year]   DATE OF PROCEDURE:  04/05/2003  DATE OF DISCHARGE:                                 OPERATIVE REPORT   PREOPERATIVE DIAGNOSIS:  Intractable back and left leg pain.   POSTOPERATIVE DIAGNOSIS:  Intractable back and left leg pain.   OPERATIVE PROCEDURE:  Placement of  spinal cord stimulator.   SURGEON:  Payton Doughty, M.D.   SERVICE:  Neurosurgery   ANESTHESIA:  General endotracheal anesthesia.   PREPARATION:  Betadine scrub and alcohol wipe.   COMPLICATIONS:  None.   ASSISTANT:  [place]   BODY OF TEXT:  60 year old right handed white female with intractable back  and left lower extremity pain.  She is taken to the operating room, smoothly  anesthetized, intubated, placed prone on the operating table.  Following  shave, prep, and drape in the usual sterile fashion, the skin was incised  from the bottom of T10 to the top of T12 and the lamina of T11 was exposed  in the subperiosteal plane bilaterally.  Partial laminectomy of T11 was done  to allow access to the posterior epidural space.  Once the dura was  visualized, laminectomy was widened to allow passage of the spinal cord  stimulating electrode.  It was passed freely in the epidural space.  Interoperative x-ray showed that it started at approximately mid T-11 and  went up to approximately the top of T9.  Basically, it covered T10 and T9.  It was anchored in place with the anchor stitches.  A subcutaneous tunnel  was connected for the extension wires which were connected to the spinal  cord stimulating wires.  These were capped and the cap secured with 2-0  silk.  The wire was coiled and placed in the laminectomy defect.  Hemostasis  was  assured and the entire area was irrigated with Bacitracin.  At the exit  site, the wires were uncomplicated.  The fascia and subcutaneous tissues  were closed with 0 Vicryl in an interrupted fashion, the subcutaneous  tissues were reapproximated with 3-0 Vicryl in an interrupted fashion, and  the skin was closed with 3-0 nylon in a running locked fashion.  Betadine  and Telfa dressing was applied at both sites and the patient returned to the  recovery room in good condition.                                               Payton Doughty, M.D.    MWR/MEDQ  D:  04/05/2003  T:  04/05/2003  Job:  [id number]

## 2010-09-14 NOTE — Op Note (Signed)
NAME:  KELIS, PLASSE                       ACCOUNT NO.:  [id number]   MEDICAL RECORD NO.:  [id number]                   PATIENT TYPE:  OIB   LOCATION:  2899                                 FACILITY:  [facility]   PHYSICIAN:  Payton Doughty, M.D.                   DATE OF BIRTH:  03-Nov-1950   DATE OF PROCEDURE:  12/02/2003  DATE OF DISCHARGE:  12/02/2003                                 OPERATIVE REPORT   PREOPERATIVE DIAGNOSIS:  Nonfunctioning spinal cord stimulating receiver.   POSTOPERATIVE DIAGNOSIS:  Nonfunctioning spinal cord stimulating receiver.   OPERATIVE PROCEDURE:  Removal of spinal cord stimulating receiver.   SURGEON:  Payton Doughty, M.D.   SERVICE:  Neurosurgery   ANESTHESIA:  General endotracheal anesthesia.   PREPARATION:  Betadine and alcohol wipe.   COMPLICATIONS:  None.   BODY OF TEXT:  60 year old girl who has had the spine part of her stimulator  removed at a prior operation and wanted to have the receiver removed.  She  was taken to the operating room, smoothly anesthetized, and intubated.  She  was placed prone on the operating table.  Following shave, prep, and drape  in the usual sterile fashion, the skin was incised over the receiver site.  The receiver was mobilized and withdrawn with the attached wires.  The  entire wire was recovered.  The wound was irrigated and hemostasis assured  and closed in successive layers with 2-0 Vicryl, 3-0 Vicryl, and nylon in  the skin.  The patient returned to the recovery room in good condition.                                               Payton Doughty, M.D.    MWR/MEDQ  D:  12/02/2003  T:  12/02/2003  Job:  [id number]

## 2010-09-14 NOTE — H&P (Signed)
NAME:  [name], Courtney A.                      ACCOUNT NO.:  [id number]   MEDICAL RECORD NO.:  [id number]                   PATIENT TYPE:  OIB   LOCATION:                                       FACILITY:  [facility]   PHYSICIAN:  Payton Doughty, M.D.                   DATE OF BIRTH:  [birth year]   DATE OF ADMISSION:  04/12/2003  DATE OF DISCHARGE:  04/13/2003                                HISTORY & PHYSICAL   ADMISSION DIAGNOSIS:  Intractable pain with implanted spinal cord  stimulator.   HISTORY:  The patient is a 60 year old right-handed white lady who has a  failed back and intractable back pain, had a spinal cord stimulator placed a  week ago.  Seems to be working to take care of the pain down her left leg.  She still has some back pain.  She is now admitted for placement of the  receiver.   PAST MEDICAL HISTORY:  1. Foot injury.  2. Achilles tendon repair.  3. Numerous spine operations by Dr. Renae Fickle and Dr. Gasper Sells.   MEDICATIONS:  1. Methadone 10 mg.  2. Flexeril b.i.d.  3. Remeron 45 mg a day.  4. Xanax two to three times a day.  5. Celebrex 200 mg once a day.  6. Levoxyl 100 mcg once a day.  7. Prozac 20 mg a day.  8. Midrin for headaches.   ALLERGIES:  No known drug allergies.   SOCIAL HISTORY:  She smokes a pack of cigarettes a day, does not drink  alcohol, is on Disability.   FAMILY HISTORY:  Mother is deceased.  Dad is 70 years old and in reasonably  good health, heart disease, and prostate cancer.   REVIEW OF SYSTEMS:  Remarkable for glasses, chronic cough, back pain, leg  pain, joint pain, arthritis, anxiety, depression, thyroid disease.   PHYSICAL EXAMINATION:  HEENT:  Within normal limits.  NECK:  She has good range of motion of her neck.  CHEST:  Few crackles.  CARDIAC:  Regular rate and rhythm.  ABDOMEN:  Nontender, no hepatosplenomegaly.  EXTREMITIES:  Without clubbing or cyanosis.  Peripheral pulses are good.  GENITOURINARY:  Deferred.  NEUROLOGIC:  She  is awake, alert, and oriented.  Cranial nerves are intact.  Motor exam shows 5/5 strength throughout the upper extremities.  Lower  extremities show plantar and dorsal flexion weak of the right foot as  related to her Achilles tendon injury and ankle reconstruction, otherwise,  proximal leg musculature is full strength on the left side.  She has  weakness of the knee extensors, reflexes are 1 at the knees bilaterally, 1  at the left ankle, straight-leg raise is negative.  Spinal cord stimulator  site is well-healed.   CLINICAL IMPRESSION:  Status post placement of spinal cord stimulator.  I  believe it needs to be readjusted or implanted to meet the needs for pain  control  of her lumbar spine.                                                Payton Doughty, M.D.    MWR/MEDQ  D:  04/12/2003  T:  04/12/2003  Job:  [id number]

## 2010-09-14 NOTE — H&P (Signed)
NAME:  [name], [name]                       ACCOUNT NO.:  [id number]   MEDICAL RECORD NO.:  [id number]                   PATIENT TYPE:  INP   LOCATION:  3041                                 FACILITY:  [facility]   PHYSICIAN:  Payton Doughty, M.D.                   DATE OF BIRTH:  01-Mar-1951   DATE OF ADMISSION:  07/02/2002  DATE OF DISCHARGE:                                HISTORY & PHYSICAL   ADMISSION DIAGNOSIS:  Nonunion at L4-5, question of nonunion L5-S1.   HISTORY OF PRESENT ILLNESS:  This is a 60 year old, right-handed, white  female who has undergone a lumbar fusion at 5-1 in situ by Dr. Renae Fickle in 1989  and a 4-5 with Ray cages in 1999 by Dr. Jeni Salles.  She has a lot of back pain  and left-sided leg pain.  There are plain films show extreme left lateral  placement of the left-sided cage with interference of the L4 neural foramen  as well as a questionable fusion at L5-S1 and a lot of exuberant bone  overgrowth of the cage on the left side suggestive of instability.  She is  admitted for exploration and fusion and decompression of the nerve roots and  revision.   MEDICAL HISTORY:  Remarkable for an injury to her right foot.  She had  Achilles tendon repair as an adult which dehisced and had skin grafting over  it.   MEDICATIONS:  Medications center around pain management.  She is on  methadone 10 mg every six hours, Flexeril b.i.d., Remeron 45 mg once a day,  Xanax two or three times a day, vitamin B, Celebrex 200 mg once a day,  Levoxyl 100 mcg once a day, Prozac 20 mg a day, and Midrin for migraines.  She does not have any allergies.   SOCIAL HISTORY:  She smokes a pack of cigarettes a day.  Does not drink  alcohol.  Is on disability.   FAMILY HISTORY:  Mother is deceased of cancer, age is not given.  Dad is 46  years old in reasonably good health with heart disease and prostate cancer.   REVIEW OF SYSTEMS:  Remarkable for glasses, chronic cough, back pain, leg  pain, joint  pain, arthritis, anxiety, depression, and thyroid disease.   PHYSICAL EXAMINATION:  HEENT:  Within normal limits.  NECK:  She has good range of motion in her neck.  CHEST:  Has a few crackles.  CARDIAC:  Regular rate and rhythm.  ABDOMEN:  Nontender with no hepatosplenomegaly.  EXTREMITIES:  Without clubbing or cyanosis.  Peripheral pulses are good.  GU:  Deferred.  NEUROLOGIC:  She is awake, alert, and oriented.  Cranial nerves are intact.  Motor exam shows 5/5 strength throughout the upper extremities.  Lower  extremities shows plantar dorsiflexion weakness of the right foot that is  related to her Achilles tendon injury and ankle reconstruction.  Otherwise,  proximal  leg musculature is full strength on the left side.  She has  weakness in the knee extensors.  Reflexes are 1 at the knees bilaterally, 1  at the left ankle.  Straight leg raise is negative.   Plain films demonstrate a left-sided cage in extreme left position.  MRI  confirms this and shows interference of the left L4 neural foramen.  5-1,  cannot say much about the fusion other than there is seemingly much bone  growth.  She also has clumping of the nerve roots strongly suggestive of  arachnoiditis.   CLINICAL IMPRESSION:  Lumbar spondylosis with failed fusion at L4-5 and  intractable left leg pain.   PLAN:  Explore the fusion, decompress the left L4 nerve root, and pack some  subcutaneous fat around it.  I will place pedicle screws at L4-L5.  If the 5-  1 fusion is unstable, we will place pedicle screws at that level as well.  The risks and benefits of this approach have been discussed with her and she  wishes to proceed.                                               Payton Doughty, M.D.    MWR/MEDQ  D:  07/02/2002  T:  07/02/2002  Job:  [id number]

## 2010-09-14 NOTE — Op Note (Signed)
NAME:  [name], [name]                       ACCOUNT NO.:  [id number]   MEDICAL RECORD NO.:  [id number]                   PATIENT TYPE:  INP   LOCATION:  3041                                 FACILITY:  [facility]   PHYSICIAN:  Payton Doughty, M.D.                   DATE OF BIRTH:  [birth year]   DATE OF PROCEDURE:  07/02/2002  DATE OF DISCHARGE:                                 OPERATIVE REPORT   PREOPERATIVE DIAGNOSES:  1. Nonunion at L4-5.  2. Question nonunion at L5-S1.  3. Compression of the left L4 root in the neural foramen.   POSTOPERATIVE DIAGNOSES:  1. Nonunion at L4-5.  2. Question nonunion at L5-S1.  3. Compression of the left L4 root in the neural foramen.   PROCEDURE:  L4-5 left decompressive foraminotomy and L4-5 nonsegmental  posterior fusion with posterolateral arthrodesis.   SURGEON:  Payton Doughty, M.D.   ASSESSMENT:  Nurse assistant:  Basilia Jumbo.  Doctor assistant:  Stefani Dama, M.D.   ANESTHESIA:  General endotracheal.   PREPARATION:  Sterile Betadine prep and scrub with alcohol wipe.   DESCRIPTION OF PROCEDURE:  This is a 60 year old lady who had two prior  fusions by an orthopedic doctor and by a neurosurgeon, who has left L4  radiculopathy and probable nonunion at L4-5.  She was taken to the operating  room and smoothly anesthetized and intubated, placed prone on the operating  table.  Following shave, prep, and drape in the usual sterile fashion, skin  was incised from the top of L3 to the bottom of S1, and the lamina of L3,  the lamina of L5, and the lamina of S1 were exposed bilaterally out over the  transverse processes and out over the old fusion.  The L5-S1 fusion was  solid.  There was no rocking motion.  L4-5 showed a nonunion, which could be  mobilized by grasping the spinous processes of L5 and lifting.  Using the  transverse process of L4 as a landmark, the transverse process of L5 was  demonstrated bilaterally.  Dissection into the scar using  the L4 and L5  pedicles as landmarks allowed decompression of the L4 nerve root in its  neural foramen.  The nerve was generously decompressed from its exit out  over the lateral aspect of the vertebral body, medially to the vertebral  sac.  Exploration of the anterior epidural space was carried out.  The nerve  could not be sufficiently mobilized to remove the  Ray Cage.  The nerve was  decompressed as far as could be all the way around.  It was padded with  thrombin-soaked Gelfoam.  Because of the nonunion, it was felt that if the  rocking motion could be stopped that the nerve may be permitted to recover,  and it was felt to be too dangerous in terms of injuring the nerve to try to  remove the  Ray Cage.  Therefore, pedicle screws were placed in L4 and L5  using the standard landmarks and intraoperative x-ray showed good placement  of the screws.  They were connected with a rod and the locking caps placed.  The transverse processes of L4 and L5 were decorticated and covered with DVX  bone mix as a posterolateral arthrodesis.  The fascia was reapproximated  with 0 Vicryl in interrupted fashion, the subcutaneous tissue was  reapproximated with 0  Vicryl in interrupted fashion, the subcuticular tissue was reapproximated  with 3-0 Vicryl in interrupted fashion.  The skin was closed with 3-0 nylon  in running locked fashion.  A Betadine Telfa dressing was applied and made  occlusive with OpSite.  The patient returned to the recovery room in good  condition.                                               Payton Doughty, M.D.    MWR/MEDQ  D:  07/02/2002  T:  07/03/2002  Job:  [id number]

## 2010-09-14 NOTE — H&P (Signed)
NAME:  [name], [name]                       ACCOUNT NO.:  [id number]   MEDICAL RECORD NO.:  [id number]                   PATIENT TYPE:  OIB   LOCATION:  3028                                 FACILITY:  [facility]   PHYSICIAN:  Payton Doughty, M.D.                   DATE OF BIRTH:  February 11, 1951   DATE OF ADMISSION:  09/14/2003  DATE OF DISCHARGE:                                HISTORY & PHYSICAL   ADMITTING DIAGNOSIS:  Intractable pain with implanted spinal cord  stimulator.   SERVICE:  Neurosurgery.   __________  A 60 year old right lady who has had a spinal cord stimulator  placed in December 2004.  She had done reasonably well but is getting  stimulation up into her back and in her chest that is not working for her.  It makes it hard for her to use her spinal cord stimulator.  In an effort to  salvage the spinal cord stimulator, we are going to place the  hyperpolarizing electrode to see if this would enable her to use the  stimulator.   MEDICAL HISTORY:  1. Numerous spine operations by Dr. Renae Fickle and Dr. __________  .  2. She has also had Achilles tendon repair.  3. Foot injury.   MEDICATIONS:  1. Methadone 10 mg three times a day.  2. Endocet six a day.  3. She also uses Remeron, Xanax, Celebrex, Levoxyl, Prozac, and Midrin.   ALLERGIES:  None.   SOCIAL HISTORY:  She smokes a pack of cigarettes a day.  Does not drink  alcohol.  Is on disability.   FAMILY HISTORY:  Mother is deceased.  Dad is 44 years old, reasonably good  health with heart disease and prostate cancer.   REVIEW OF SYSTEMS:  Remarkable for glasses, chronic cough, back pain, leg  pain, joint pain, arthritis, anxiety, depression, thyroid disease.   PHYSICAL EXAMINATION:  HEENT:  Normal limits.  NECK:  She has good range of motion of her neck.  CHEST:  A few crackles.  CARDIAC:  Regular rate and rhythm.  ABDOMEN:  Nontender.  No hepatosplenomegaly.  EXTREMITIES:  Without clubbing or cyanosis.  GU:  Deferred.   Peripheral pulses are good.  NEUROLOGIC:  She is awake, alert, and oriented.  Cranial nerves are intact.  Motor exam demonstrates 5/5 strength throughout the upper extremities, lower  extremities have plantar and dorsiflexion weakness of right foot related to  her Achilles injury and ankle reconstruction.  Otherwise proximal leg  musculature is full strength on the left side.  She has weakness in the  extensors.  Reflexes are 1 knees bilaterally, 1 left ankle.  Straight leg  raise is negative.  Stimulator site is well healed.   CLINICAL IMPRESSION:  Spinal cord stimulator with intractable back pain and  difficulty with ectopic stimulation.   PLAN:  Place the hyperpolarizing electrode from approximately mid T10 to mid  T9.  The risks and  benefits of this approach were discussed with her, and  she wishes to proceed.                                                Payton Doughty, M.D.    MWR/MEDQ  D:  09/14/2003  T:  09/14/2003  Job:  [id number]

## 2010-09-14 NOTE — Discharge Summary (Signed)
   NAME:  [name], [name]                       ACCOUNT NO.:  [id number]   MEDICAL RECORD NO.:  [id number]                   PATIENT TYPE:  INP   LOCATION:  3041                                 FACILITY:  [facility]   PHYSICIAN:  Payton Doughty, M.D.                   DATE OF BIRTH:  03-Apr-1951   DATE OF ADMISSION:  07/02/2002  DATE OF DISCHARGE:  07/06/2002                                 DISCHARGE SUMMARY   ADMISSION DIAGNOSIS:  L4-5 nonunion.   DISCHARGE DIAGNOSIS:  L4-5 nonunion.   PROCEDURE:  L4-5 posterior lateral arthrodesis and pedicle screws.   This 60 year old right handed, white lady's history and physical is  recounted in the chart.  She has undergone a 5-1 fusion numerous years ago  and in 1999 underwent a 4-5 fusion by Dr. Gasper Sells.  Postoperatively, she  had back and left leg pain.  Films are suggestive of nonunion.  She was  admitted for repair.   MEDICATIONS:  Methadone, Flexeril, Remeron, Xanax, vitamin B, Celebrex,  Levoxyl, Prozac and Midrin.   PHYSICAL EXAMINATION:  Her exam demonstrated intact strength.  She had a lot  of pain in her back and leg.   HOSPITAL COURSE:  She was admitted after ascertainment of normal laboratory  values and underwent a decompression at 4-5.  The right sided cage was  somewhat diminished.  The right sided cage was posteriorly placed in the  neuroforamen.  It could not be mobilized.  The nerve root was decompressed.  Pedicle screws were placed without difficulty.  Postoperatively, she did  well with marked diminution of back pain, although she still has leg and hip  pain.  She participated in physical therapy for several days.  She was able  to be up and about with a walker.  She was discharged on methadone for pain.  Her follow up will be in the [facility] office in a  week for suture removal.                                               Payton Doughty, M.D.    MWR/MEDQ  D:  08/18/2002  T:  08/19/2002  Job:   [id number]

## 2011-01-18 ENCOUNTER — Encounter: Payer: Self-pay | Admitting: Pulmonary Disease

## 2011-01-21 ENCOUNTER — Encounter: Payer: Self-pay | Admitting: Pulmonary Disease

## 2011-01-21 ENCOUNTER — Ambulatory Visit (INDEPENDENT_AMBULATORY_CARE_PROVIDER_SITE_OTHER): Payer: Medicare Other | Admitting: Pulmonary Disease

## 2011-01-21 VITALS — BP 110/70 | HR 70 | Temp 98.2°F | Ht 65.5 in | Wt 129.4 lb

## 2011-01-21 DIAGNOSIS — Z23 Encounter for immunization: Secondary | ICD-10-CM

## 2011-01-21 DIAGNOSIS — J189 Pneumonia, unspecified organism: Secondary | ICD-10-CM

## 2011-01-21 MED ORDER — CIPROFLOXACIN HCL 750 MG PO TABS: 750.0000 mg | ORAL_TABLET | Freq: Two times a day (BID) | ORAL | Status: AC

## 2011-01-21 NOTE — Progress Notes (Signed)
  Subjective:    Patient ID: [name], female    DOB: 1950/07/21, 60 y.o.   MRN: [id number]  HPI PCP - Herbert Seta spry  53 yowf quit smoking 2006, for FU of recurrent Rt sided pneumonias starting 2008.   Pulmonary History:  -CT chest 9/09 [facility]: resolving air space dz rul, rll and shrinking nodes 01/11/08 vs 12/08/07  - Sinus CT neg July 15, 2008  - Nml PFTs 2/10  CT chest - mild air space disease on rt (resolving)  Swallow evaln 5/11- mild pharyngeal dysphagia , intermittent penetration low IgM level, hypersens panel neg, ANA neg, RA mild pos, ESR 12  She has chronic pain since 2006 requiring spinal cord stimulator and nl pft's documented 06/15/2008.  Pain meds being managed by Dr Ninfa Meeker, stimulator inserted by Dr Channing Mutters. 1/11 s/p steroid injection in lower thoracic nerves  10/2007 right chest tube for strep intermedius empyema,  9/09 >> klebs pneumonia in sputum cx , bscopy non diagnostic incl afb, fungal, eval by Dr Orvan Falconer - ID. Extensive w/u by ID Daiva Eves ) noted.  Bscopy 03/24/09 >> minimal secretions, BAL >>pseudomonas S to cipro, afb/ fungal/PCP neg  Recurrences in 12/09 (levaquin), 5 times in 2010 (cipro )  used macrodantin for UTI in 2/10 . hernia surgery 01/04/09  pneumovax 10/09.  Albuterol causes tremors.     01/21/2011  - 1 yr FU Needed 4 courses of cipro over the past yr, last completed sep 9th Now on lower pain meds & xanax, after local treatment to back. On trimethoprim for UTIs Husband passed due to metastatic lung ca. She is considering changing pain MD She denies dysphagia to solids/ liquids, recurrent sinusitis, asthma or recurrent infections as a child.   Review of Systems  Constitutional: Negative for fever and unexpected weight change.  HENT: Positive for sore throat and postnasal drip. Negative for ear pain, nosebleeds, congestion, rhinorrhea, sneezing, trouble swallowing, dental problem and sinus pressure.   Eyes: Negative for redness and itching.    Respiratory: Positive for cough. Negative for chest tightness, shortness of breath and wheezing.   Cardiovascular: Negative for palpitations and leg swelling.  Gastrointestinal: Negative for nausea and vomiting.  Genitourinary: Negative for dysuria.  Musculoskeletal: Positive for joint swelling.  Skin: Negative for rash.  Neurological: Negative for headaches.  Hematological: Bruises/bleeds easily.  Psychiatric/Behavioral: Negative for dysphoric mood. The patient is nervous/anxious.        Objective:   Physical Exam Gen. Pleasant, well-nourished, in no distress, anxious affect ENT - no lesions, no post nasal drip Neck: No JVD, no thyromegaly, no carotid bruits Lungs: no use of accessory muscles, no dullness to percussion, clear without rales or rhonchi  Cardiovascular: Rhythm regular, heart sounds  normal, no murmurs or gallops, no peripheral edema Musculoskeletal: No deformities, no cyanosis or clubbing         Assessment & Plan:

## 2011-01-21 NOTE — Patient Instructions (Signed)
Antibiotic Rx will be continued FLu shot

## 2011-01-22 ENCOUNTER — Encounter: Payer: Self-pay | Admitting: Pulmonary Disease

## 2011-01-22 NOTE — Assessment & Plan Note (Signed)
As predicted , the frequency of her episodes has decreased with the decrease in her pain meds. I would be concerned about cipro resistance - but this regimen seems to have worked for her. I once again discussed symptoms that would prompt use of Abx - trying to discourage needless use.  She will call us if symptoms do not improve within a few days of ABx or if episodes become more frequent.

## 2011-04-15 ENCOUNTER — Telehealth: Payer: Self-pay | Admitting: Pulmonary Disease

## 2011-04-15 NOTE — Telephone Encounter (Signed)
Pt called to let RA know that she is using the Cipro rx more often right now-prone to getting PNA. Pt states she is having more back pain as well and would like to know if RA will email Dr Alphonse Guild in [place](pt had to leave [facility] office as her husband passed away and was the driver to appts)-would like RA to inform Dr Alphonse Guild that her pain needs to be controlled better as she is prone to PNA and hurts to breathe. Pt was unclear in why RA is requested to do this letter. Please advise.

## 2011-04-15 NOTE — Telephone Encounter (Signed)
Pl send a copy of my last note to Dr Alphonse Guild. Also get a phone no & I can call.

## 2011-04-19 NOTE — Telephone Encounter (Signed)
Called spoke with patient:  Dr Ranae Pila @ [facility] [phone].  Contact added to pt's chart.  Ov note faxed via biscom.  Will forward back to RA.  Dr Janie Morning office is closed until 12.27.12.

## 2011-05-01 NOTE — Telephone Encounter (Signed)
Left message for Dr Wynona Canes with his nurse, Clotilde Dieter.

## 2011-05-30 ENCOUNTER — Telehealth: Payer: Self-pay | Admitting: Pulmonary Disease

## 2011-05-30 NOTE — Telephone Encounter (Signed)
Reviewed pt's chart in centricity, on 06/29/2009, pt was given both Tdap and PNA vaccine by Dr. Clinton Gallant office.  LMOM for [name] with Dr. Myrtie Neither office informing her of the above information.

## 2011-07-22 ENCOUNTER — Telehealth: Payer: Self-pay | Admitting: Pulmonary Disease

## 2011-07-22 MED ORDER — CIPROFLOXACIN HCL 500 MG PO TABS: 500.0000 mg | ORAL_TABLET | Freq: Two times a day (BID) | ORAL | Status: AC

## 2011-07-22 NOTE — Telephone Encounter (Signed)
Ok - cipro 500 bid x 10 days

## 2011-07-22 NOTE — Telephone Encounter (Signed)
I spoke with pt and is aware rx has been sent to the pharmacy. Nothing further was needed 

## 2011-07-22 NOTE — Telephone Encounter (Signed)
Spoke with pt. She states that she is concerned about getting PNA again b/c her back pain is starting to get worse. She states that when she is in pain, and can not move around a lot she tends to get sick very quickly. She does not have any respiratory c/o's at this time, but wants rx for cipro with refills to have on hand just in case. She states that her current prescription has expired. Please advise, thanks!

## 2011-08-06 IMAGING — CR DG CHEST 2V
2 series · 2 of 2 positions shown · non-contrast
Comparison: 06/14/2009 and CT chest 03/02/2009

CLINICAL DATA: Recurrent pneumonia.

CHEST - 2 VIEW

[view not recorded (1 of 2)]
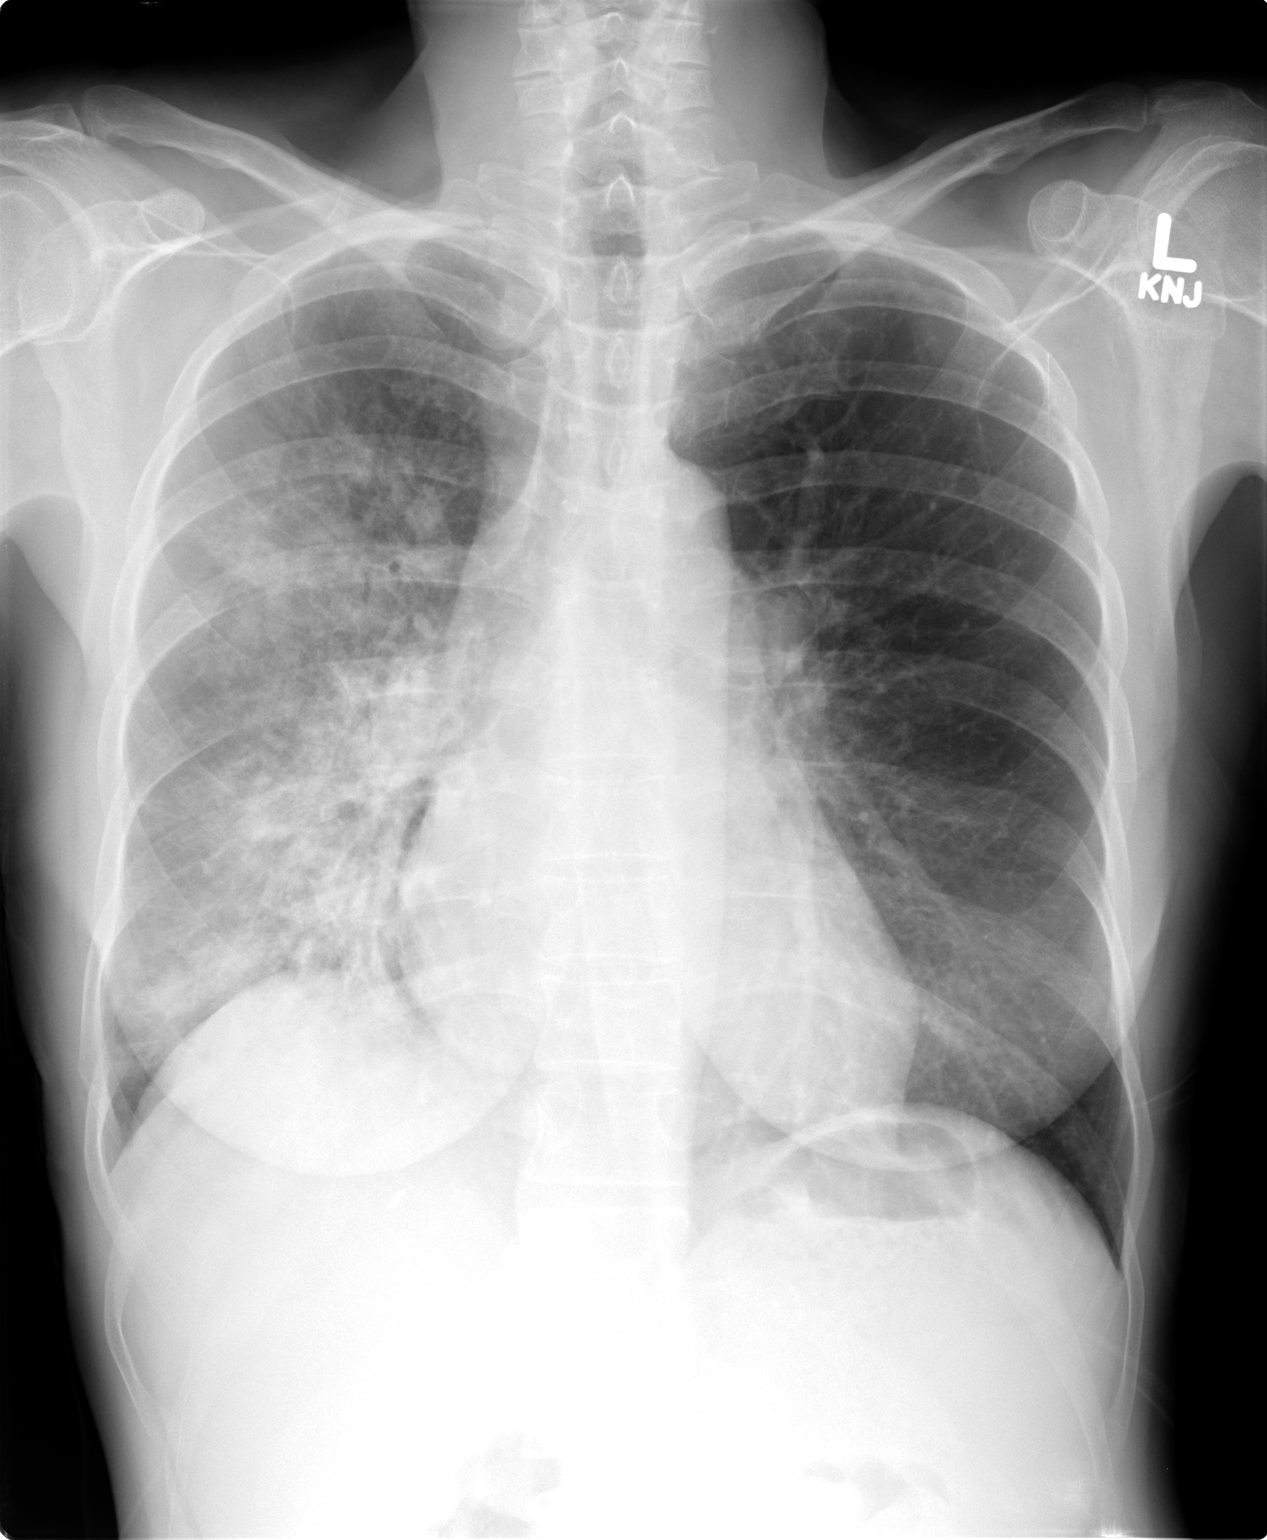

[view not recorded (2 of 2)]
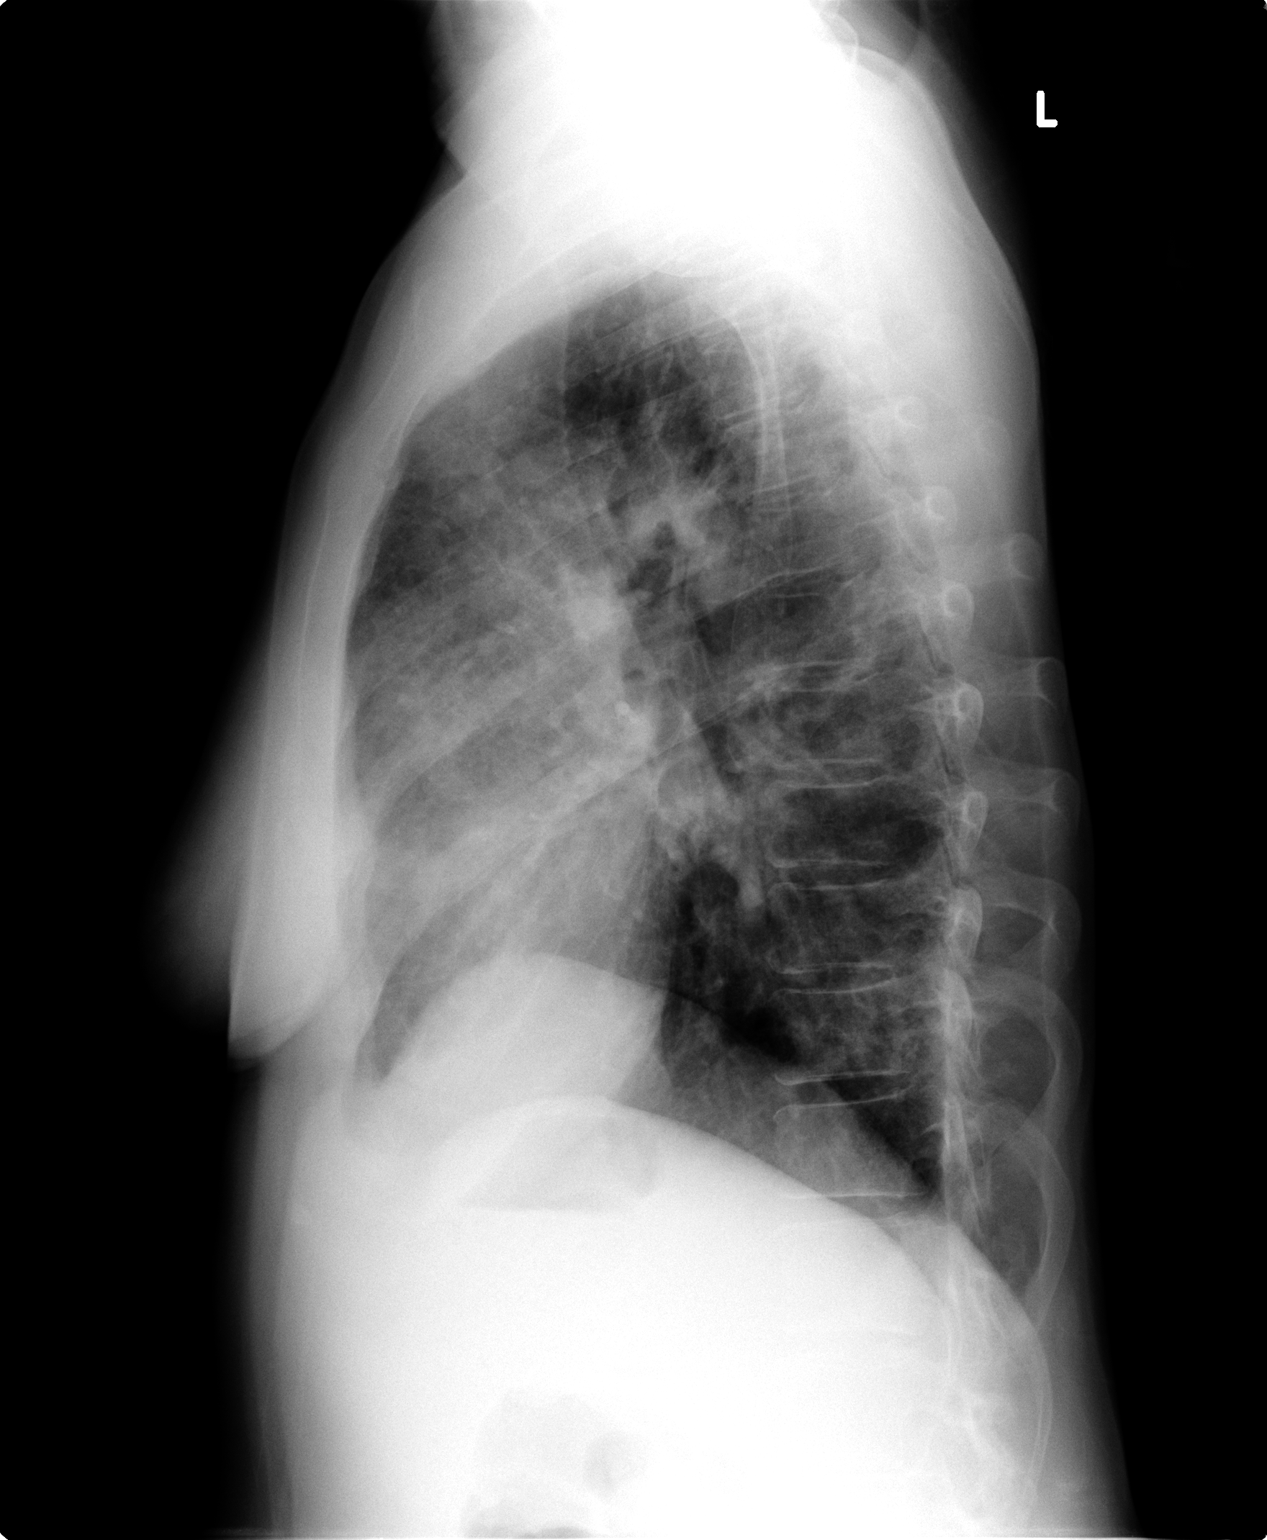

[2 of 2 positions shown; findings below may reference images not displayed]

FINDINGS: Trachea is midline.  Heart size normal.  There is air
space disease throughout the right upper lobe, as well as lateral
segment right middle lobe.  Question early right lower lobe
involvement. No pleural fluid.
IMPRESSION: Right upper and right middle lobe pneumonia.  Question early
involvement of the right lower lobe.  Follow-up to clearing is
strongly recommended.

## 2011-10-04 ENCOUNTER — Ambulatory Visit: Payer: Medicare Other | Admitting: Pulmonary Disease

## 2011-10-07 ENCOUNTER — Other Ambulatory Visit: Payer: Self-pay | Admitting: Pulmonary Disease

## 2011-10-08 ENCOUNTER — Telehealth: Payer: Self-pay | Admitting: Pulmonary Disease

## 2011-10-08 MED ORDER — CIPROFLOXACIN HCL 750 MG PO TABS: 750.0000 mg | ORAL_TABLET | Freq: Two times a day (BID) | ORAL | Status: AC

## 2011-10-08 NOTE — Telephone Encounter (Signed)
Pt aware of RA recs and will call back for an apt. rx has been sent

## 2011-10-08 NOTE — Telephone Encounter (Signed)
Called, spoke with pt who states she has a "bad ability to get pna easily."  C/o croupy cough which is prod with light green mucus, glands in neck are slightly achy, and sore throat - started over the weekend.  Denies increased SOB, wheezing, chest tightness, increase in back pain, or f/c/s.  Is gargling with salt water with some relief and taking mucinex bid.  Requesting cipro 750 mg.  Dr. Vassie Loll, pls advise.  Thank you.  Note: Pt was last seen 12/2010.  I offered OV but pt states she is unable to come in d/t transportation.    [facility]

## 2011-10-08 NOTE — Telephone Encounter (Signed)
Ok to send Rx 750 bid x 7ds Pl schedule appt for her when convenient

## 2011-10-08 NOTE — Telephone Encounter (Signed)
Pt returned triage's call.  Holly D Pryor ° °

## 2011-10-08 NOTE — Telephone Encounter (Signed)
Called # provided above - [email or web address] 

## 2011-11-20 ENCOUNTER — Encounter: Payer: Self-pay | Admitting: Pulmonary Disease

## 2011-11-20 ENCOUNTER — Ambulatory Visit (INDEPENDENT_AMBULATORY_CARE_PROVIDER_SITE_OTHER): Payer: Medicare Other | Admitting: Pulmonary Disease

## 2011-11-20 VITALS — BP 122/74 | HR 75 | Temp 98.2°F | Ht 64.5 in | Wt 129.8 lb

## 2011-11-20 DIAGNOSIS — J189 Pneumonia, unspecified organism: Secondary | ICD-10-CM

## 2011-11-20 DIAGNOSIS — N390 Urinary tract infection, site not specified: Secondary | ICD-10-CM

## 2011-11-20 NOTE — Progress Notes (Signed)
  Subjective:    Patient ID: [name], female    DOB: 04-Jul-1950, 61 y.o.   MRN: [id number]  HPI  PCP - Herbert Seta spry    43 yowf quit smoking 2006, for FU of recurrent Rt sided pneumonias starting 2008.  She has chronic pain since 2006 requiring spinal cord stimulator and nl pft's documented 06/15/2008.  Pain meds being managed by Dr Ninfa Meeker, stimulator inserted by Dr Channing Mutters. 1/11 s/p steroid injection in lower thoracic nerves  10/2007 right chest tube for strep intermedius empyema,  9/09 >> klebs pneumonia in sputum cx , bscopy non diagnostic incl afb, fungal, eval by Dr Orvan Falconer - ID. Extensive w/u by ID Daiva Eves ) noted.  Bscopy 03/24/09 >> minimal secretions, BAL >>pseudomonas S to cipro, afb/ fungal/PCP neg   -CT chest 9/09 [facility]: resolving air space dz rul, rll and shrinking nodes 01/11/08 vs 12/08/07  - Sinus CT neg July 15, 2008  - Nml PFTs 2/10  CT chest - mild air space disease on rt (resolving)  Swallow evaln 5/11- mild pharyngeal dysphagia , intermittent penetration  low IgM level, hypersens panel neg, ANA neg, RA mild pos, ESR 12   Recurrences in 12/09 (levaquin), 5 times in 2010 (cipro ) , 4 times in 2012, twice in 2013 used macrodantin for UTI in 2/10, On trimethoprim for UTIs  . hernia surgery 01/04/09  pneumovax 10/09.  Albuterol causes tremors.  Husband passed due to metastatic lung ca.    11/20/2011 Took cipro twice in 2013 so far - she still has tabs left over Back to dr Marlynn Perking in [place] after changing pain MD  She denies dysphagia to solids/ liquids, recurrent sinusitis, asthma or recurrent infections as a child  Review of Systems neg for any significant sore throat, dysphagia, itching, sneezing, nasal congestion or excess/ purulent secretions, fever, chills, sweats, unintended wt loss, pleuritic or exertional cp, hempoptysis, orthopnea pnd or change in chronic leg swelling. Also denies presyncope, palpitations, heartburn, abdominal pain, nausea, vomiting,  diarrhea or change in bowel or urinary habits, dysuria,hematuria, rash, arthralgias, visual complaints, headache, numbness weakness or ataxia.     Objective:   Physical Exam  Gen. Pleasant, well-nourished, in no distress ENT - no lesions, no post nasal drip Neck: No JVD, no thyromegaly, no carotid bruits Lungs: no use of accessory muscles, no dullness to percussion, clear without rales or rhonchi  Cardiovascular: Rhythm regular, heart sounds  normal, no murmurs or gallops, no peripheral edema Musculoskeletal: No deformities, no cyanosis or clubbing        Assessment & Plan:

## 2011-11-20 NOTE — Patient Instructions (Addendum)
Be more judicious in using cipro because bacteria can get resistant Use it ONLY for - fever + green phlegm We will allow upto 3 Rx per year of cipro OK to stop daliresp

## 2011-11-21 NOTE — Assessment & Plan Note (Addendum)
I would be concerned about cipro resistance - but this regimen seems to have worked for her. I once again discussed symptoms that would prompt use of Abx - trying to discourage needless use.  Also asked her not to stop cipro before 5 days once started She will call us if symptoms do not improve within a few days of ABx or if episodes become more frequent. Unfortunately, transport is an issue for her, so we will keep seeing her once /yr - her PCP can take over this problem from here on in my opinion. Daliresp not indicated here since she does not have copd

## 2012-03-11 ENCOUNTER — Telehealth: Payer: Self-pay | Admitting: Pulmonary Disease

## 2012-03-11 ENCOUNTER — Other Ambulatory Visit: Payer: Self-pay | Admitting: Pulmonary Disease

## 2012-03-11 MED ORDER — CIPROFLOXACIN HCL 500 MG PO TABS: 500.0000 mg | ORAL_TABLET | Freq: Two times a day (BID) | ORAL | Status: DC

## 2012-03-11 MED ORDER — CIPROFLOXACIN HCL 750 MG PO TABS: 750.0000 mg | ORAL_TABLET | Freq: Two times a day (BID) | ORAL | Status: DC

## 2012-03-11 NOTE — Telephone Encounter (Signed)
OK to fill cipro 500 bid  X 5 ds

## 2012-03-11 NOTE — Telephone Encounter (Signed)
Pt  Requesting blisters in throat ,coughing up green phlem,fever 101.o with sweats requesting  Abx. Per last note is this ok to fill for her without being seen? Please advise

## 2012-03-11 NOTE — Telephone Encounter (Signed)
rx called in pt is aware

## 2012-09-15 ENCOUNTER — Other Ambulatory Visit: Payer: Self-pay | Admitting: Anesthesiology

## 2012-10-01 ENCOUNTER — Other Ambulatory Visit: Payer: Self-pay | Admitting: Anesthesiology

## 2012-10-01 DIAGNOSIS — M47817 Spondylosis without myelopathy or radiculopathy, lumbosacral region: Secondary | ICD-10-CM

## 2012-10-05 ENCOUNTER — Other Ambulatory Visit: Payer: Medicare Other

## 2012-10-12 ENCOUNTER — Ambulatory Visit
Admission: RE | Admit: 2012-10-12 | Discharge: 2012-10-12 | Disposition: A | Discharge status: Home / Self Care | Payer: Medicare Other | Source: Ambulatory Visit | Attending: Anesthesiology | Admitting: Anesthesiology

## 2012-10-12 DIAGNOSIS — M47817 Spondylosis without myelopathy or radiculopathy, lumbosacral region: Secondary | ICD-10-CM

## 2012-12-10 ENCOUNTER — Ambulatory Visit: Payer: Medicare Other | Admitting: Pulmonary Disease

## 2012-12-17 ENCOUNTER — Ambulatory Visit: Payer: Medicare Other | Admitting: Pulmonary Disease

## 2013-01-20 ENCOUNTER — Ambulatory Visit: Payer: Medicare Other | Admitting: Pulmonary Disease

## 2014-05-12 DIAGNOSIS — Z79891 Long term (current) use of opiate analgesic: Secondary | ICD-10-CM | POA: Diagnosis not present

## 2014-06-03 DIAGNOSIS — R000 Tachycardia, unspecified: Secondary | ICD-10-CM | POA: Diagnosis not present

## 2014-06-03 DIAGNOSIS — Z6820 Body mass index (BMI) 20.0-20.9, adult: Secondary | ICD-10-CM | POA: Diagnosis not present

## 2014-06-03 DIAGNOSIS — E039 Hypothyroidism, unspecified: Secondary | ICD-10-CM | POA: Diagnosis not present

## 2014-06-03 DIAGNOSIS — R05 Cough: Secondary | ICD-10-CM | POA: Diagnosis not present

## 2014-06-10 DIAGNOSIS — M47814 Spondylosis without myelopathy or radiculopathy, thoracic region: Secondary | ICD-10-CM | POA: Diagnosis not present

## 2014-07-20 DIAGNOSIS — M546 Pain in thoracic spine: Secondary | ICD-10-CM | POA: Diagnosis not present

## 2014-07-20 DIAGNOSIS — M47814 Spondylosis without myelopathy or radiculopathy, thoracic region: Secondary | ICD-10-CM | POA: Diagnosis not present

## 2014-07-20 DIAGNOSIS — G894 Chronic pain syndrome: Secondary | ICD-10-CM | POA: Diagnosis not present

## 2014-07-20 DIAGNOSIS — M5380 Other specified dorsopathies, site unspecified: Secondary | ICD-10-CM | POA: Diagnosis not present

## 2014-07-20 DIAGNOSIS — F1120 Opioid dependence, uncomplicated: Secondary | ICD-10-CM | POA: Diagnosis not present

## 2014-09-21 DIAGNOSIS — M545 Low back pain: Secondary | ICD-10-CM | POA: Diagnosis not present

## 2014-10-03 DIAGNOSIS — Z79891 Long term (current) use of opiate analgesic: Secondary | ICD-10-CM | POA: Diagnosis not present

## 2014-10-03 DIAGNOSIS — M546 Pain in thoracic spine: Secondary | ICD-10-CM | POA: Diagnosis not present

## 2014-10-03 DIAGNOSIS — I10 Essential (primary) hypertension: Secondary | ICD-10-CM | POA: Diagnosis not present

## 2014-10-03 DIAGNOSIS — J449 Chronic obstructive pulmonary disease, unspecified: Secondary | ICD-10-CM | POA: Diagnosis not present

## 2014-10-03 DIAGNOSIS — M810 Age-related osteoporosis without current pathological fracture: Secondary | ICD-10-CM | POA: Diagnosis not present

## 2014-10-26 DIAGNOSIS — M47814 Spondylosis without myelopathy or radiculopathy, thoracic region: Secondary | ICD-10-CM | POA: Diagnosis not present

## 2014-12-02 DIAGNOSIS — M546 Pain in thoracic spine: Secondary | ICD-10-CM | POA: Diagnosis not present

## 2014-12-02 DIAGNOSIS — M47817 Spondylosis without myelopathy or radiculopathy, lumbosacral region: Secondary | ICD-10-CM | POA: Diagnosis not present

## 2014-12-02 DIAGNOSIS — Z79891 Long term (current) use of opiate analgesic: Secondary | ICD-10-CM | POA: Diagnosis not present

## 2014-12-02 DIAGNOSIS — M533 Sacrococcygeal disorders, not elsewhere classified: Secondary | ICD-10-CM | POA: Diagnosis not present

## 2014-12-21 DIAGNOSIS — M47817 Spondylosis without myelopathy or radiculopathy, lumbosacral region: Secondary | ICD-10-CM | POA: Diagnosis not present

## 2014-12-27 DIAGNOSIS — L300 Nummular dermatitis: Secondary | ICD-10-CM | POA: Diagnosis not present

## 2014-12-27 DIAGNOSIS — L219 Seborrheic dermatitis, unspecified: Secondary | ICD-10-CM | POA: Diagnosis not present

## 2014-12-27 DIAGNOSIS — L299 Pruritus, unspecified: Secondary | ICD-10-CM | POA: Diagnosis not present

## 2015-01-11 DIAGNOSIS — M47817 Spondylosis without myelopathy or radiculopathy, lumbosacral region: Secondary | ICD-10-CM | POA: Diagnosis not present

## 2015-01-17 DIAGNOSIS — L300 Nummular dermatitis: Secondary | ICD-10-CM | POA: Diagnosis not present

## 2015-02-09 DIAGNOSIS — M5380 Other specified dorsopathies, site unspecified: Secondary | ICD-10-CM | POA: Diagnosis not present

## 2015-02-09 DIAGNOSIS — M47817 Spondylosis without myelopathy or radiculopathy, lumbosacral region: Secondary | ICD-10-CM | POA: Diagnosis not present

## 2015-02-09 DIAGNOSIS — M533 Sacrococcygeal disorders, not elsewhere classified: Secondary | ICD-10-CM | POA: Diagnosis not present

## 2015-03-10 DIAGNOSIS — Z79891 Long term (current) use of opiate analgesic: Secondary | ICD-10-CM | POA: Diagnosis not present

## 2015-03-10 DIAGNOSIS — M47817 Spondylosis without myelopathy or radiculopathy, lumbosacral region: Secondary | ICD-10-CM | POA: Diagnosis not present

## 2015-03-16 DIAGNOSIS — Z23 Encounter for immunization: Secondary | ICD-10-CM | POA: Diagnosis not present

## 2015-04-04 DIAGNOSIS — H2513 Age-related nuclear cataract, bilateral: Secondary | ICD-10-CM | POA: Diagnosis not present

## 2015-04-18 DIAGNOSIS — M47817 Spondylosis without myelopathy or radiculopathy, lumbosacral region: Secondary | ICD-10-CM | POA: Diagnosis not present

## 2015-04-18 DIAGNOSIS — Z79891 Long term (current) use of opiate analgesic: Secondary | ICD-10-CM | POA: Diagnosis not present

## 2015-04-18 DIAGNOSIS — M533 Sacrococcygeal disorders, not elsewhere classified: Secondary | ICD-10-CM | POA: Diagnosis not present

## 2015-04-18 DIAGNOSIS — M47814 Spondylosis without myelopathy or radiculopathy, thoracic region: Secondary | ICD-10-CM | POA: Diagnosis not present

## 2015-04-18 DIAGNOSIS — G894 Chronic pain syndrome: Secondary | ICD-10-CM | POA: Diagnosis not present

## 2015-04-18 DIAGNOSIS — G8929 Other chronic pain: Secondary | ICD-10-CM | POA: Diagnosis not present

## 2015-05-25 DIAGNOSIS — E039 Hypothyroidism, unspecified: Secondary | ICD-10-CM | POA: Diagnosis not present

## 2015-05-25 DIAGNOSIS — I10 Essential (primary) hypertension: Secondary | ICD-10-CM | POA: Diagnosis not present

## 2015-05-29 DIAGNOSIS — M47817 Spondylosis without myelopathy or radiculopathy, lumbosacral region: Secondary | ICD-10-CM | POA: Diagnosis not present

## 2015-05-30 DIAGNOSIS — E876 Hypokalemia: Secondary | ICD-10-CM | POA: Diagnosis not present

## 2015-05-30 DIAGNOSIS — J9620 Acute and chronic respiratory failure, unspecified whether with hypoxia or hypercapnia: Secondary | ICD-10-CM | POA: Diagnosis not present

## 2015-05-30 DIAGNOSIS — E612 Magnesium deficiency: Secondary | ICD-10-CM | POA: Diagnosis not present

## 2015-05-30 DIAGNOSIS — J9601 Acute respiratory failure with hypoxia: Secondary | ICD-10-CM | POA: Diagnosis not present

## 2015-05-30 DIAGNOSIS — J9621 Acute and chronic respiratory failure with hypoxia: Secondary | ICD-10-CM | POA: Diagnosis not present

## 2015-05-30 DIAGNOSIS — J441 Chronic obstructive pulmonary disease with (acute) exacerbation: Secondary | ICD-10-CM | POA: Diagnosis not present

## 2015-05-30 DIAGNOSIS — M1990 Unspecified osteoarthritis, unspecified site: Secondary | ICD-10-CM | POA: Diagnosis not present

## 2015-05-30 DIAGNOSIS — N3000 Acute cystitis without hematuria: Secondary | ICD-10-CM | POA: Diagnosis not present

## 2015-05-30 DIAGNOSIS — Z87440 Personal history of urinary (tract) infections: Secondary | ICD-10-CM | POA: Diagnosis not present

## 2015-05-30 DIAGNOSIS — R079 Chest pain, unspecified: Secondary | ICD-10-CM | POA: Diagnosis not present

## 2015-05-30 DIAGNOSIS — Z87891 Personal history of nicotine dependence: Secondary | ICD-10-CM | POA: Diagnosis not present

## 2015-05-30 DIAGNOSIS — J155 Pneumonia due to Escherichia coli: Secondary | ICD-10-CM | POA: Diagnosis not present

## 2015-05-30 DIAGNOSIS — K8020 Calculus of gallbladder without cholecystitis without obstruction: Secondary | ICD-10-CM | POA: Diagnosis not present

## 2015-05-30 DIAGNOSIS — R509 Fever, unspecified: Secondary | ICD-10-CM | POA: Diagnosis not present

## 2015-05-30 DIAGNOSIS — Z7982 Long term (current) use of aspirin: Secondary | ICD-10-CM | POA: Diagnosis not present

## 2015-05-30 DIAGNOSIS — J449 Chronic obstructive pulmonary disease, unspecified: Secondary | ICD-10-CM | POA: Diagnosis not present

## 2015-05-30 DIAGNOSIS — Z7952 Long term (current) use of systemic steroids: Secondary | ICD-10-CM | POA: Diagnosis not present

## 2015-05-30 DIAGNOSIS — R000 Tachycardia, unspecified: Secondary | ICD-10-CM | POA: Diagnosis not present

## 2015-05-30 DIAGNOSIS — A419 Sepsis, unspecified organism: Secondary | ICD-10-CM | POA: Diagnosis not present

## 2015-05-30 DIAGNOSIS — Z8709 Personal history of other diseases of the respiratory system: Secondary | ICD-10-CM | POA: Diagnosis not present

## 2015-05-30 DIAGNOSIS — E871 Hypo-osmolality and hyponatremia: Secondary | ICD-10-CM | POA: Diagnosis not present

## 2015-05-30 DIAGNOSIS — R05 Cough: Secondary | ICD-10-CM | POA: Diagnosis not present

## 2015-05-30 DIAGNOSIS — I10 Essential (primary) hypertension: Secondary | ICD-10-CM | POA: Diagnosis not present

## 2015-05-30 DIAGNOSIS — Z885 Allergy status to narcotic agent status: Secondary | ICD-10-CM | POA: Diagnosis not present

## 2015-05-30 DIAGNOSIS — J189 Pneumonia, unspecified organism: Secondary | ICD-10-CM | POA: Diagnosis not present

## 2015-05-30 DIAGNOSIS — Z23 Encounter for immunization: Secondary | ICD-10-CM | POA: Diagnosis not present

## 2015-05-30 DIAGNOSIS — R0902 Hypoxemia: Secondary | ICD-10-CM | POA: Diagnosis not present

## 2015-05-30 DIAGNOSIS — J9690 Respiratory failure, unspecified, unspecified whether with hypoxia or hypercapnia: Secondary | ICD-10-CM | POA: Diagnosis not present

## 2015-05-30 DIAGNOSIS — R0602 Shortness of breath: Secondary | ICD-10-CM | POA: Diagnosis not present

## 2015-05-30 DIAGNOSIS — J159 Unspecified bacterial pneumonia: Secondary | ICD-10-CM | POA: Diagnosis not present

## 2015-06-05 DIAGNOSIS — J9621 Acute and chronic respiratory failure with hypoxia: Secondary | ICD-10-CM | POA: Diagnosis not present

## 2015-06-05 DIAGNOSIS — I10 Essential (primary) hypertension: Secondary | ICD-10-CM | POA: Diagnosis not present

## 2015-06-05 DIAGNOSIS — Z87440 Personal history of urinary (tract) infections: Secondary | ICD-10-CM | POA: Diagnosis not present

## 2015-06-05 DIAGNOSIS — N3000 Acute cystitis without hematuria: Secondary | ICD-10-CM | POA: Diagnosis not present

## 2015-06-05 DIAGNOSIS — E612 Magnesium deficiency: Secondary | ICD-10-CM | POA: Diagnosis not present

## 2015-06-05 DIAGNOSIS — J155 Pneumonia due to Escherichia coli: Secondary | ICD-10-CM | POA: Diagnosis not present

## 2015-06-05 DIAGNOSIS — E876 Hypokalemia: Secondary | ICD-10-CM | POA: Diagnosis not present

## 2015-06-05 DIAGNOSIS — Z87891 Personal history of nicotine dependence: Secondary | ICD-10-CM | POA: Diagnosis not present

## 2015-06-05 DIAGNOSIS — Z23 Encounter for immunization: Secondary | ICD-10-CM | POA: Diagnosis not present

## 2015-06-05 DIAGNOSIS — Z7982 Long term (current) use of aspirin: Secondary | ICD-10-CM | POA: Diagnosis not present

## 2015-06-05 DIAGNOSIS — Z7952 Long term (current) use of systemic steroids: Secondary | ICD-10-CM | POA: Diagnosis not present

## 2015-06-05 DIAGNOSIS — E871 Hypo-osmolality and hyponatremia: Secondary | ICD-10-CM | POA: Diagnosis not present

## 2015-06-05 DIAGNOSIS — M1990 Unspecified osteoarthritis, unspecified site: Secondary | ICD-10-CM | POA: Diagnosis not present

## 2015-06-05 DIAGNOSIS — K8020 Calculus of gallbladder without cholecystitis without obstruction: Secondary | ICD-10-CM | POA: Diagnosis not present

## 2015-06-05 DIAGNOSIS — Z885 Allergy status to narcotic agent status: Secondary | ICD-10-CM | POA: Diagnosis not present

## 2015-06-05 DIAGNOSIS — J441 Chronic obstructive pulmonary disease with (acute) exacerbation: Secondary | ICD-10-CM | POA: Diagnosis not present

## 2015-06-12 DIAGNOSIS — Z1389 Encounter for screening for other disorder: Secondary | ICD-10-CM | POA: Diagnosis not present

## 2015-06-12 DIAGNOSIS — J159 Unspecified bacterial pneumonia: Secondary | ICD-10-CM | POA: Diagnosis not present

## 2015-06-12 DIAGNOSIS — B370 Candidal stomatitis: Secondary | ICD-10-CM | POA: Diagnosis not present

## 2015-06-12 DIAGNOSIS — J441 Chronic obstructive pulmonary disease with (acute) exacerbation: Secondary | ICD-10-CM | POA: Diagnosis not present

## 2015-06-30 DIAGNOSIS — M546 Pain in thoracic spine: Secondary | ICD-10-CM | POA: Diagnosis not present

## 2015-06-30 DIAGNOSIS — Z79891 Long term (current) use of opiate analgesic: Secondary | ICD-10-CM | POA: Diagnosis not present

## 2015-06-30 DIAGNOSIS — M461 Sacroiliitis, not elsewhere classified: Secondary | ICD-10-CM | POA: Diagnosis not present

## 2015-07-03 DIAGNOSIS — J449 Chronic obstructive pulmonary disease, unspecified: Secondary | ICD-10-CM | POA: Diagnosis not present

## 2015-07-13 DIAGNOSIS — R05 Cough: Secondary | ICD-10-CM | POA: Diagnosis not present

## 2015-07-13 DIAGNOSIS — E440 Moderate protein-calorie malnutrition: Secondary | ICD-10-CM | POA: Diagnosis not present

## 2015-07-13 DIAGNOSIS — Z0000 Encounter for general adult medical examination without abnormal findings: Secondary | ICD-10-CM | POA: Diagnosis not present

## 2015-07-13 DIAGNOSIS — E785 Hyperlipidemia, unspecified: Secondary | ICD-10-CM | POA: Diagnosis not present

## 2015-07-13 DIAGNOSIS — Z139 Encounter for screening, unspecified: Secondary | ICD-10-CM | POA: Diagnosis not present

## 2015-07-13 DIAGNOSIS — J181 Lobar pneumonia, unspecified organism: Secondary | ICD-10-CM | POA: Diagnosis not present

## 2015-07-13 DIAGNOSIS — J189 Pneumonia, unspecified organism: Secondary | ICD-10-CM | POA: Diagnosis not present

## 2015-07-13 DIAGNOSIS — Z9981 Dependence on supplemental oxygen: Secondary | ICD-10-CM | POA: Diagnosis not present

## 2015-07-13 DIAGNOSIS — J9620 Acute and chronic respiratory failure, unspecified whether with hypoxia or hypercapnia: Secondary | ICD-10-CM | POA: Diagnosis not present

## 2015-07-13 DIAGNOSIS — J441 Chronic obstructive pulmonary disease with (acute) exacerbation: Secondary | ICD-10-CM | POA: Diagnosis not present

## 2015-07-13 DIAGNOSIS — F339 Major depressive disorder, recurrent, unspecified: Secondary | ICD-10-CM | POA: Diagnosis not present

## 2015-08-03 DIAGNOSIS — J449 Chronic obstructive pulmonary disease, unspecified: Secondary | ICD-10-CM | POA: Diagnosis not present

## 2015-08-04 DIAGNOSIS — M546 Pain in thoracic spine: Secondary | ICD-10-CM | POA: Diagnosis not present

## 2015-08-04 DIAGNOSIS — M47814 Spondylosis without myelopathy or radiculopathy, thoracic region: Secondary | ICD-10-CM | POA: Diagnosis not present

## 2015-08-04 DIAGNOSIS — G894 Chronic pain syndrome: Secondary | ICD-10-CM | POA: Diagnosis not present

## 2015-08-04 DIAGNOSIS — M47817 Spondylosis without myelopathy or radiculopathy, lumbosacral region: Secondary | ICD-10-CM | POA: Diagnosis not present

## 2015-08-04 DIAGNOSIS — Z79891 Long term (current) use of opiate analgesic: Secondary | ICD-10-CM | POA: Diagnosis not present

## 2015-08-14 DIAGNOSIS — J449 Chronic obstructive pulmonary disease, unspecified: Secondary | ICD-10-CM | POA: Diagnosis not present

## 2015-09-02 DIAGNOSIS — J449 Chronic obstructive pulmonary disease, unspecified: Secondary | ICD-10-CM | POA: Diagnosis not present

## 2015-09-13 DIAGNOSIS — J449 Chronic obstructive pulmonary disease, unspecified: Secondary | ICD-10-CM | POA: Diagnosis not present

## 2015-09-15 DIAGNOSIS — M47814 Spondylosis without myelopathy or radiculopathy, thoracic region: Secondary | ICD-10-CM | POA: Diagnosis not present

## 2015-10-03 DIAGNOSIS — J449 Chronic obstructive pulmonary disease, unspecified: Secondary | ICD-10-CM | POA: Diagnosis not present

## 2015-10-11 DIAGNOSIS — G8929 Other chronic pain: Secondary | ICD-10-CM | POA: Diagnosis not present

## 2015-10-11 DIAGNOSIS — G894 Chronic pain syndrome: Secondary | ICD-10-CM | POA: Diagnosis not present

## 2015-10-11 DIAGNOSIS — Z79891 Long term (current) use of opiate analgesic: Secondary | ICD-10-CM | POA: Diagnosis not present

## 2015-10-11 DIAGNOSIS — M47814 Spondylosis without myelopathy or radiculopathy, thoracic region: Secondary | ICD-10-CM | POA: Diagnosis not present

## 2015-10-11 DIAGNOSIS — M533 Sacrococcygeal disorders, not elsewhere classified: Secondary | ICD-10-CM | POA: Diagnosis not present

## 2015-10-14 DIAGNOSIS — J449 Chronic obstructive pulmonary disease, unspecified: Secondary | ICD-10-CM | POA: Diagnosis not present

## 2015-11-02 DIAGNOSIS — J449 Chronic obstructive pulmonary disease, unspecified: Secondary | ICD-10-CM | POA: Diagnosis not present

## 2015-11-07 DIAGNOSIS — M47817 Spondylosis without myelopathy or radiculopathy, lumbosacral region: Secondary | ICD-10-CM | POA: Diagnosis not present

## 2015-11-09 DIAGNOSIS — Z9981 Dependence on supplemental oxygen: Secondary | ICD-10-CM | POA: Diagnosis not present

## 2015-11-09 DIAGNOSIS — Z7189 Other specified counseling: Secondary | ICD-10-CM | POA: Diagnosis not present

## 2015-11-09 DIAGNOSIS — J9620 Acute and chronic respiratory failure, unspecified whether with hypoxia or hypercapnia: Secondary | ICD-10-CM | POA: Diagnosis not present

## 2015-11-09 DIAGNOSIS — R079 Chest pain, unspecified: Secondary | ICD-10-CM | POA: Diagnosis not present

## 2015-11-09 DIAGNOSIS — J411 Mucopurulent chronic bronchitis: Secondary | ICD-10-CM | POA: Diagnosis not present

## 2015-11-09 DIAGNOSIS — R0602 Shortness of breath: Secondary | ICD-10-CM | POA: Diagnosis not present

## 2015-11-09 DIAGNOSIS — R05 Cough: Secondary | ICD-10-CM | POA: Diagnosis not present

## 2015-11-13 DIAGNOSIS — J449 Chronic obstructive pulmonary disease, unspecified: Secondary | ICD-10-CM | POA: Diagnosis not present

## 2015-11-16 DIAGNOSIS — I10 Essential (primary) hypertension: Secondary | ICD-10-CM | POA: Diagnosis not present

## 2015-11-16 DIAGNOSIS — E039 Hypothyroidism, unspecified: Secondary | ICD-10-CM | POA: Diagnosis not present

## 2015-11-16 DIAGNOSIS — E785 Hyperlipidemia, unspecified: Secondary | ICD-10-CM | POA: Diagnosis not present

## 2015-11-16 DIAGNOSIS — R7301 Impaired fasting glucose: Secondary | ICD-10-CM | POA: Diagnosis not present

## 2015-11-22 DIAGNOSIS — M47817 Spondylosis without myelopathy or radiculopathy, lumbosacral region: Secondary | ICD-10-CM | POA: Diagnosis not present

## 2015-11-22 DIAGNOSIS — G894 Chronic pain syndrome: Secondary | ICD-10-CM | POA: Diagnosis not present

## 2015-11-22 DIAGNOSIS — Z79891 Long term (current) use of opiate analgesic: Secondary | ICD-10-CM | POA: Diagnosis not present

## 2015-11-23 DIAGNOSIS — I10 Essential (primary) hypertension: Secondary | ICD-10-CM | POA: Diagnosis not present

## 2015-11-23 DIAGNOSIS — E782 Mixed hyperlipidemia: Secondary | ICD-10-CM | POA: Diagnosis not present

## 2015-11-23 DIAGNOSIS — L550 Sunburn of first degree: Secondary | ICD-10-CM | POA: Diagnosis not present

## 2015-11-23 DIAGNOSIS — E039 Hypothyroidism, unspecified: Secondary | ICD-10-CM | POA: Diagnosis not present

## 2015-11-30 DIAGNOSIS — J441 Chronic obstructive pulmonary disease with (acute) exacerbation: Secondary | ICD-10-CM | POA: Diagnosis not present

## 2015-11-30 DIAGNOSIS — J9610 Chronic respiratory failure, unspecified whether with hypoxia or hypercapnia: Secondary | ICD-10-CM | POA: Diagnosis not present

## 2015-11-30 DIAGNOSIS — Z9981 Dependence on supplemental oxygen: Secondary | ICD-10-CM | POA: Diagnosis not present

## 2015-12-03 DIAGNOSIS — J449 Chronic obstructive pulmonary disease, unspecified: Secondary | ICD-10-CM | POA: Diagnosis not present

## 2015-12-08 DIAGNOSIS — M546 Pain in thoracic spine: Secondary | ICD-10-CM | POA: Diagnosis not present

## 2015-12-08 DIAGNOSIS — M47814 Spondylosis without myelopathy or radiculopathy, thoracic region: Secondary | ICD-10-CM | POA: Diagnosis not present

## 2015-12-08 DIAGNOSIS — Z79891 Long term (current) use of opiate analgesic: Secondary | ICD-10-CM | POA: Diagnosis not present

## 2015-12-08 DIAGNOSIS — M47817 Spondylosis without myelopathy or radiculopathy, lumbosacral region: Secondary | ICD-10-CM | POA: Diagnosis not present

## 2015-12-14 DIAGNOSIS — J449 Chronic obstructive pulmonary disease, unspecified: Secondary | ICD-10-CM | POA: Diagnosis not present

## 2015-12-18 DIAGNOSIS — M47814 Spondylosis without myelopathy or radiculopathy, thoracic region: Secondary | ICD-10-CM | POA: Diagnosis not present

## 2015-12-21 DIAGNOSIS — J441 Chronic obstructive pulmonary disease with (acute) exacerbation: Secondary | ICD-10-CM | POA: Diagnosis not present

## 2015-12-21 DIAGNOSIS — Z7189 Other specified counseling: Secondary | ICD-10-CM | POA: Diagnosis not present

## 2016-01-03 DIAGNOSIS — J449 Chronic obstructive pulmonary disease, unspecified: Secondary | ICD-10-CM | POA: Diagnosis not present

## 2016-01-05 DIAGNOSIS — M47817 Spondylosis without myelopathy or radiculopathy, lumbosacral region: Secondary | ICD-10-CM | POA: Diagnosis not present

## 2016-01-05 DIAGNOSIS — M47814 Spondylosis without myelopathy or radiculopathy, thoracic region: Secondary | ICD-10-CM | POA: Diagnosis not present

## 2016-01-05 DIAGNOSIS — G894 Chronic pain syndrome: Secondary | ICD-10-CM | POA: Diagnosis not present

## 2016-01-05 DIAGNOSIS — M1288 Other specific arthropathies, not elsewhere classified, other specified site: Secondary | ICD-10-CM | POA: Diagnosis not present

## 2016-01-12 DIAGNOSIS — Z1389 Encounter for screening for other disorder: Secondary | ICD-10-CM | POA: Diagnosis not present

## 2016-01-12 DIAGNOSIS — Z6820 Body mass index (BMI) 20.0-20.9, adult: Secondary | ICD-10-CM | POA: Diagnosis not present

## 2016-01-12 DIAGNOSIS — J441 Chronic obstructive pulmonary disease with (acute) exacerbation: Secondary | ICD-10-CM | POA: Diagnosis not present

## 2016-01-14 DIAGNOSIS — J449 Chronic obstructive pulmonary disease, unspecified: Secondary | ICD-10-CM | POA: Diagnosis not present

## 2016-01-25 DIAGNOSIS — M47817 Spondylosis without myelopathy or radiculopathy, lumbosacral region: Secondary | ICD-10-CM | POA: Diagnosis not present

## 2016-02-02 DIAGNOSIS — J449 Chronic obstructive pulmonary disease, unspecified: Secondary | ICD-10-CM | POA: Diagnosis not present

## 2016-02-07 DIAGNOSIS — M2550 Pain in unspecified joint: Secondary | ICD-10-CM | POA: Diagnosis not present

## 2016-02-07 DIAGNOSIS — M25511 Pain in right shoulder: Secondary | ICD-10-CM | POA: Diagnosis not present

## 2016-02-07 DIAGNOSIS — Z7689 Persons encountering health services in other specified circumstances: Secondary | ICD-10-CM | POA: Diagnosis not present

## 2016-02-07 DIAGNOSIS — Z23 Encounter for immunization: Secondary | ICD-10-CM | POA: Diagnosis not present

## 2016-02-12 DIAGNOSIS — M546 Pain in thoracic spine: Secondary | ICD-10-CM | POA: Diagnosis not present

## 2016-02-12 DIAGNOSIS — G894 Chronic pain syndrome: Secondary | ICD-10-CM | POA: Diagnosis not present

## 2016-02-12 DIAGNOSIS — M1288 Other specific arthropathies, not elsewhere classified, other specified site: Secondary | ICD-10-CM | POA: Diagnosis not present

## 2016-02-12 DIAGNOSIS — M47814 Spondylosis without myelopathy or radiculopathy, thoracic region: Secondary | ICD-10-CM | POA: Diagnosis not present

## 2016-02-12 DIAGNOSIS — Z79891 Long term (current) use of opiate analgesic: Secondary | ICD-10-CM | POA: Diagnosis not present

## 2016-02-13 DIAGNOSIS — J449 Chronic obstructive pulmonary disease, unspecified: Secondary | ICD-10-CM | POA: Diagnosis not present

## 2016-02-19 DIAGNOSIS — H04123 Dry eye syndrome of bilateral lacrimal glands: Secondary | ICD-10-CM | POA: Diagnosis not present

## 2016-02-19 DIAGNOSIS — H2513 Age-related nuclear cataract, bilateral: Secondary | ICD-10-CM | POA: Diagnosis not present

## 2016-02-20 DIAGNOSIS — J9610 Chronic respiratory failure, unspecified whether with hypoxia or hypercapnia: Secondary | ICD-10-CM | POA: Diagnosis not present

## 2016-02-20 DIAGNOSIS — M79642 Pain in left hand: Secondary | ICD-10-CM | POA: Diagnosis not present

## 2016-02-20 DIAGNOSIS — M79641 Pain in right hand: Secondary | ICD-10-CM | POA: Diagnosis not present

## 2016-02-26 DIAGNOSIS — M19041 Primary osteoarthritis, right hand: Secondary | ICD-10-CM | POA: Diagnosis not present

## 2016-02-26 DIAGNOSIS — M79641 Pain in right hand: Secondary | ICD-10-CM | POA: Diagnosis not present

## 2016-02-26 DIAGNOSIS — M19042 Primary osteoarthritis, left hand: Secondary | ICD-10-CM | POA: Diagnosis not present

## 2016-02-26 DIAGNOSIS — M79642 Pain in left hand: Secondary | ICD-10-CM | POA: Diagnosis not present

## 2016-02-28 DIAGNOSIS — M47814 Spondylosis without myelopathy or radiculopathy, thoracic region: Secondary | ICD-10-CM | POA: Diagnosis not present

## 2016-03-04 DIAGNOSIS — J449 Chronic obstructive pulmonary disease, unspecified: Secondary | ICD-10-CM | POA: Diagnosis not present

## 2016-03-15 DIAGNOSIS — J449 Chronic obstructive pulmonary disease, unspecified: Secondary | ICD-10-CM | POA: Diagnosis not present

## 2016-03-28 DIAGNOSIS — Z79891 Long term (current) use of opiate analgesic: Secondary | ICD-10-CM | POA: Diagnosis not present

## 2016-04-03 DIAGNOSIS — J449 Chronic obstructive pulmonary disease, unspecified: Secondary | ICD-10-CM | POA: Diagnosis not present

## 2016-04-11 DIAGNOSIS — M150 Primary generalized (osteo)arthritis: Secondary | ICD-10-CM | POA: Diagnosis not present

## 2016-04-11 DIAGNOSIS — R768 Other specified abnormal immunological findings in serum: Secondary | ICD-10-CM | POA: Diagnosis not present

## 2016-04-13 DIAGNOSIS — Z79899 Other long term (current) drug therapy: Secondary | ICD-10-CM | POA: Diagnosis not present

## 2016-04-13 DIAGNOSIS — I10 Essential (primary) hypertension: Secondary | ICD-10-CM | POA: Diagnosis not present

## 2016-04-13 DIAGNOSIS — E441 Mild protein-calorie malnutrition: Secondary | ICD-10-CM | POA: Diagnosis not present

## 2016-04-13 DIAGNOSIS — Z7982 Long term (current) use of aspirin: Secondary | ICD-10-CM | POA: Diagnosis not present

## 2016-04-13 DIAGNOSIS — G8929 Other chronic pain: Secondary | ICD-10-CM | POA: Diagnosis not present

## 2016-04-13 DIAGNOSIS — M546 Pain in thoracic spine: Secondary | ICD-10-CM | POA: Diagnosis not present

## 2016-04-13 DIAGNOSIS — M5136 Other intervertebral disc degeneration, lumbar region: Secondary | ICD-10-CM | POA: Diagnosis not present

## 2016-04-13 DIAGNOSIS — J9621 Acute and chronic respiratory failure with hypoxia: Secondary | ICD-10-CM | POA: Diagnosis not present

## 2016-04-13 DIAGNOSIS — R0902 Hypoxemia: Secondary | ICD-10-CM | POA: Diagnosis not present

## 2016-04-13 DIAGNOSIS — R0602 Shortness of breath: Secondary | ICD-10-CM | POA: Diagnosis not present

## 2016-04-13 DIAGNOSIS — Z6820 Body mass index (BMI) 20.0-20.9, adult: Secondary | ICD-10-CM | POA: Diagnosis not present

## 2016-04-13 DIAGNOSIS — G4700 Insomnia, unspecified: Secondary | ICD-10-CM

## 2016-04-13 DIAGNOSIS — J189 Pneumonia, unspecified organism: Secondary | ICD-10-CM | POA: Diagnosis not present

## 2016-04-13 DIAGNOSIS — J9601 Acute respiratory failure with hypoxia: Secondary | ICD-10-CM

## 2016-04-13 DIAGNOSIS — F418 Other specified anxiety disorders: Secondary | ICD-10-CM | POA: Diagnosis not present

## 2016-04-13 DIAGNOSIS — R7989 Other specified abnormal findings of blood chemistry: Secondary | ICD-10-CM | POA: Diagnosis not present

## 2016-04-13 DIAGNOSIS — E039 Hypothyroidism, unspecified: Secondary | ICD-10-CM | POA: Diagnosis not present

## 2016-04-14 DIAGNOSIS — F418 Other specified anxiety disorders: Secondary | ICD-10-CM | POA: Diagnosis not present

## 2016-04-14 DIAGNOSIS — G8929 Other chronic pain: Secondary | ICD-10-CM | POA: Diagnosis not present

## 2016-04-14 DIAGNOSIS — E039 Hypothyroidism, unspecified: Secondary | ICD-10-CM | POA: Diagnosis not present

## 2016-04-14 DIAGNOSIS — I10 Essential (primary) hypertension: Secondary | ICD-10-CM | POA: Diagnosis not present

## 2016-04-14 DIAGNOSIS — R791 Abnormal coagulation profile: Secondary | ICD-10-CM | POA: Diagnosis not present

## 2016-04-14 DIAGNOSIS — J449 Chronic obstructive pulmonary disease, unspecified: Secondary | ICD-10-CM | POA: Diagnosis not present

## 2016-04-15 DIAGNOSIS — M47814 Spondylosis without myelopathy or radiculopathy, thoracic region: Secondary | ICD-10-CM | POA: Diagnosis not present

## 2016-04-15 DIAGNOSIS — Z79891 Long term (current) use of opiate analgesic: Secondary | ICD-10-CM | POA: Diagnosis not present

## 2016-04-15 DIAGNOSIS — G894 Chronic pain syndrome: Secondary | ICD-10-CM | POA: Diagnosis not present

## 2016-04-15 DIAGNOSIS — M47817 Spondylosis without myelopathy or radiculopathy, lumbosacral region: Secondary | ICD-10-CM | POA: Diagnosis not present

## 2016-04-19 DIAGNOSIS — J189 Pneumonia, unspecified organism: Secondary | ICD-10-CM | POA: Diagnosis not present

## 2016-04-19 DIAGNOSIS — J449 Chronic obstructive pulmonary disease, unspecified: Secondary | ICD-10-CM | POA: Diagnosis not present

## 2016-04-25 DIAGNOSIS — M549 Dorsalgia, unspecified: Secondary | ICD-10-CM | POA: Diagnosis not present

## 2016-04-25 DIAGNOSIS — G8929 Other chronic pain: Secondary | ICD-10-CM | POA: Diagnosis not present

## 2016-04-25 DIAGNOSIS — J189 Pneumonia, unspecified organism: Secondary | ICD-10-CM | POA: Diagnosis not present

## 2016-05-01 DIAGNOSIS — J441 Chronic obstructive pulmonary disease with (acute) exacerbation: Secondary | ICD-10-CM | POA: Diagnosis not present

## 2016-05-04 DIAGNOSIS — J449 Chronic obstructive pulmonary disease, unspecified: Secondary | ICD-10-CM | POA: Diagnosis not present

## 2016-05-08 DIAGNOSIS — Z79891 Long term (current) use of opiate analgesic: Secondary | ICD-10-CM | POA: Diagnosis not present

## 2016-05-08 DIAGNOSIS — G894 Chronic pain syndrome: Secondary | ICD-10-CM | POA: Diagnosis not present

## 2016-05-14 DIAGNOSIS — J189 Pneumonia, unspecified organism: Secondary | ICD-10-CM | POA: Diagnosis not present

## 2016-05-14 DIAGNOSIS — J441 Chronic obstructive pulmonary disease with (acute) exacerbation: Secondary | ICD-10-CM | POA: Diagnosis not present

## 2016-05-15 DIAGNOSIS — J449 Chronic obstructive pulmonary disease, unspecified: Secondary | ICD-10-CM | POA: Diagnosis not present

## 2016-05-22 DIAGNOSIS — M47814 Spondylosis without myelopathy or radiculopathy, thoracic region: Secondary | ICD-10-CM | POA: Diagnosis not present

## 2016-05-22 DIAGNOSIS — G894 Chronic pain syndrome: Secondary | ICD-10-CM | POA: Diagnosis not present

## 2016-05-24 DIAGNOSIS — J441 Chronic obstructive pulmonary disease with (acute) exacerbation: Secondary | ICD-10-CM | POA: Diagnosis not present

## 2016-06-03 DIAGNOSIS — J449 Chronic obstructive pulmonary disease, unspecified: Secondary | ICD-10-CM | POA: Diagnosis not present

## 2016-06-04 DIAGNOSIS — J449 Chronic obstructive pulmonary disease, unspecified: Secondary | ICD-10-CM | POA: Diagnosis not present

## 2016-06-10 DIAGNOSIS — Z79891 Long term (current) use of opiate analgesic: Secondary | ICD-10-CM | POA: Diagnosis not present

## 2016-06-11 DIAGNOSIS — J441 Chronic obstructive pulmonary disease with (acute) exacerbation: Secondary | ICD-10-CM | POA: Diagnosis not present

## 2016-06-15 DIAGNOSIS — J449 Chronic obstructive pulmonary disease, unspecified: Secondary | ICD-10-CM | POA: Diagnosis not present

## 2016-06-25 DIAGNOSIS — J449 Chronic obstructive pulmonary disease, unspecified: Secondary | ICD-10-CM | POA: Diagnosis not present

## 2016-06-25 DIAGNOSIS — Z1389 Encounter for screening for other disorder: Secondary | ICD-10-CM | POA: Diagnosis not present

## 2016-06-25 DIAGNOSIS — R251 Tremor, unspecified: Secondary | ICD-10-CM | POA: Diagnosis not present

## 2016-06-25 DIAGNOSIS — Z6820 Body mass index (BMI) 20.0-20.9, adult: Secondary | ICD-10-CM | POA: Diagnosis not present

## 2016-06-25 DIAGNOSIS — Z0000 Encounter for general adult medical examination without abnormal findings: Secondary | ICD-10-CM | POA: Diagnosis not present

## 2016-06-25 DIAGNOSIS — Z139 Encounter for screening, unspecified: Secondary | ICD-10-CM | POA: Diagnosis not present

## 2016-07-02 DIAGNOSIS — J449 Chronic obstructive pulmonary disease, unspecified: Secondary | ICD-10-CM | POA: Diagnosis not present

## 2016-07-10 DIAGNOSIS — M5136 Other intervertebral disc degeneration, lumbar region: Secondary | ICD-10-CM | POA: Diagnosis not present

## 2016-07-11 DIAGNOSIS — J9610 Chronic respiratory failure, unspecified whether with hypoxia or hypercapnia: Secondary | ICD-10-CM | POA: Diagnosis not present

## 2016-07-11 DIAGNOSIS — M1711 Unilateral primary osteoarthritis, right knee: Secondary | ICD-10-CM | POA: Diagnosis not present

## 2016-07-11 DIAGNOSIS — Z6820 Body mass index (BMI) 20.0-20.9, adult: Secondary | ICD-10-CM | POA: Diagnosis not present

## 2016-07-11 DIAGNOSIS — J449 Chronic obstructive pulmonary disease, unspecified: Secondary | ICD-10-CM | POA: Diagnosis not present

## 2016-07-13 DIAGNOSIS — J449 Chronic obstructive pulmonary disease, unspecified: Secondary | ICD-10-CM | POA: Diagnosis not present

## 2016-07-15 DIAGNOSIS — M47814 Spondylosis without myelopathy or radiculopathy, thoracic region: Secondary | ICD-10-CM | POA: Diagnosis not present

## 2016-07-15 DIAGNOSIS — G894 Chronic pain syndrome: Secondary | ICD-10-CM | POA: Diagnosis not present

## 2016-07-15 DIAGNOSIS — M546 Pain in thoracic spine: Secondary | ICD-10-CM | POA: Diagnosis not present

## 2016-07-15 DIAGNOSIS — Z79891 Long term (current) use of opiate analgesic: Secondary | ICD-10-CM | POA: Diagnosis not present

## 2016-07-24 DIAGNOSIS — J449 Chronic obstructive pulmonary disease, unspecified: Secondary | ICD-10-CM | POA: Diagnosis not present

## 2016-08-08 DIAGNOSIS — E785 Hyperlipidemia, unspecified: Secondary | ICD-10-CM | POA: Diagnosis not present

## 2016-08-08 DIAGNOSIS — I10 Essential (primary) hypertension: Secondary | ICD-10-CM | POA: Diagnosis not present

## 2016-08-08 DIAGNOSIS — E039 Hypothyroidism, unspecified: Secondary | ICD-10-CM | POA: Diagnosis not present

## 2016-08-08 DIAGNOSIS — R7301 Impaired fasting glucose: Secondary | ICD-10-CM | POA: Diagnosis not present

## 2016-08-11 DIAGNOSIS — R0602 Shortness of breath: Secondary | ICD-10-CM | POA: Diagnosis not present

## 2016-08-11 DIAGNOSIS — J189 Pneumonia, unspecified organism: Secondary | ICD-10-CM | POA: Diagnosis not present

## 2016-08-11 DIAGNOSIS — R05 Cough: Secondary | ICD-10-CM | POA: Diagnosis not present

## 2016-08-12 DIAGNOSIS — M47814 Spondylosis without myelopathy or radiculopathy, thoracic region: Secondary | ICD-10-CM | POA: Diagnosis not present

## 2016-08-12 DIAGNOSIS — G8929 Other chronic pain: Secondary | ICD-10-CM | POA: Diagnosis not present

## 2016-08-15 DIAGNOSIS — J441 Chronic obstructive pulmonary disease with (acute) exacerbation: Secondary | ICD-10-CM | POA: Diagnosis not present

## 2016-08-18 DIAGNOSIS — G8929 Other chronic pain: Secondary | ICD-10-CM | POA: Diagnosis not present

## 2016-08-18 DIAGNOSIS — R05 Cough: Secondary | ICD-10-CM | POA: Diagnosis not present

## 2016-08-18 DIAGNOSIS — T402X5A Adverse effect of other opioids, initial encounter: Secondary | ICD-10-CM | POA: Diagnosis not present

## 2016-08-19 DIAGNOSIS — M47814 Spondylosis without myelopathy or radiculopathy, thoracic region: Secondary | ICD-10-CM | POA: Diagnosis not present

## 2016-08-19 DIAGNOSIS — G8929 Other chronic pain: Secondary | ICD-10-CM | POA: Diagnosis not present

## 2016-08-19 DIAGNOSIS — Z79891 Long term (current) use of opiate analgesic: Secondary | ICD-10-CM | POA: Diagnosis not present

## 2016-08-19 DIAGNOSIS — G894 Chronic pain syndrome: Secondary | ICD-10-CM | POA: Diagnosis not present

## 2016-08-20 DIAGNOSIS — J181 Lobar pneumonia, unspecified organism: Secondary | ICD-10-CM | POA: Diagnosis not present

## 2016-08-20 DIAGNOSIS — M47814 Spondylosis without myelopathy or radiculopathy, thoracic region: Secondary | ICD-10-CM | POA: Diagnosis not present

## 2016-08-20 DIAGNOSIS — Z9189 Other specified personal risk factors, not elsewhere classified: Secondary | ICD-10-CM | POA: Diagnosis not present

## 2016-08-24 DIAGNOSIS — M549 Dorsalgia, unspecified: Secondary | ICD-10-CM | POA: Diagnosis not present

## 2016-08-24 DIAGNOSIS — M545 Low back pain: Secondary | ICD-10-CM | POA: Diagnosis not present

## 2016-08-26 DIAGNOSIS — G8929 Other chronic pain: Secondary | ICD-10-CM | POA: Diagnosis not present

## 2016-08-26 DIAGNOSIS — M549 Dorsalgia, unspecified: Secondary | ICD-10-CM | POA: Diagnosis not present

## 2016-09-02 DIAGNOSIS — J449 Chronic obstructive pulmonary disease, unspecified: Secondary | ICD-10-CM | POA: Diagnosis not present

## 2016-09-03 DIAGNOSIS — G8929 Other chronic pain: Secondary | ICD-10-CM | POA: Diagnosis not present

## 2016-09-03 DIAGNOSIS — E871 Hypo-osmolality and hyponatremia: Secondary | ICD-10-CM | POA: Diagnosis not present

## 2016-09-03 DIAGNOSIS — M549 Dorsalgia, unspecified: Secondary | ICD-10-CM | POA: Diagnosis not present

## 2016-09-03 DIAGNOSIS — E039 Hypothyroidism, unspecified: Secondary | ICD-10-CM | POA: Diagnosis not present

## 2016-09-18 DIAGNOSIS — M545 Low back pain: Secondary | ICD-10-CM | POA: Diagnosis not present

## 2016-09-18 DIAGNOSIS — G8929 Other chronic pain: Secondary | ICD-10-CM | POA: Diagnosis not present

## 2016-09-24 DIAGNOSIS — G8929 Other chronic pain: Secondary | ICD-10-CM | POA: Diagnosis not present

## 2016-09-24 DIAGNOSIS — M549 Dorsalgia, unspecified: Secondary | ICD-10-CM | POA: Diagnosis not present

## 2016-09-26 DIAGNOSIS — R0789 Other chest pain: Secondary | ICD-10-CM | POA: Diagnosis not present

## 2016-09-26 DIAGNOSIS — G8918 Other acute postprocedural pain: Secondary | ICD-10-CM | POA: Diagnosis not present

## 2016-09-26 DIAGNOSIS — M549 Dorsalgia, unspecified: Secondary | ICD-10-CM | POA: Diagnosis not present

## 2016-09-26 DIAGNOSIS — G8929 Other chronic pain: Secondary | ICD-10-CM | POA: Diagnosis not present

## 2016-09-27 DIAGNOSIS — Z79891 Long term (current) use of opiate analgesic: Secondary | ICD-10-CM | POA: Diagnosis not present

## 2016-10-04 DIAGNOSIS — Z79891 Long term (current) use of opiate analgesic: Secondary | ICD-10-CM | POA: Diagnosis not present

## 2016-10-15 DIAGNOSIS — E039 Hypothyroidism, unspecified: Secondary | ICD-10-CM | POA: Diagnosis not present

## 2016-10-17 DIAGNOSIS — Z79891 Long term (current) use of opiate analgesic: Secondary | ICD-10-CM | POA: Diagnosis not present

## 2016-10-29 DIAGNOSIS — Z139 Encounter for screening, unspecified: Secondary | ICD-10-CM | POA: Diagnosis not present

## 2016-10-29 DIAGNOSIS — G8929 Other chronic pain: Secondary | ICD-10-CM | POA: Diagnosis not present

## 2016-10-29 DIAGNOSIS — J302 Other seasonal allergic rhinitis: Secondary | ICD-10-CM | POA: Diagnosis not present

## 2016-10-29 DIAGNOSIS — J3081 Allergic rhinitis due to animal (cat) (dog) hair and dander: Secondary | ICD-10-CM | POA: Diagnosis not present

## 2016-10-29 DIAGNOSIS — M461 Sacroiliitis, not elsewhere classified: Secondary | ICD-10-CM | POA: Diagnosis not present

## 2016-10-29 DIAGNOSIS — Z6820 Body mass index (BMI) 20.0-20.9, adult: Secondary | ICD-10-CM | POA: Diagnosis not present

## 2016-10-29 DIAGNOSIS — M47816 Spondylosis without myelopathy or radiculopathy, lumbar region: Secondary | ICD-10-CM | POA: Diagnosis not present

## 2016-10-29 DIAGNOSIS — M549 Dorsalgia, unspecified: Secondary | ICD-10-CM | POA: Diagnosis not present

## 2016-10-29 DIAGNOSIS — M545 Low back pain: Secondary | ICD-10-CM | POA: Diagnosis not present

## 2016-10-29 DIAGNOSIS — R05 Cough: Secondary | ICD-10-CM | POA: Diagnosis not present

## 2016-10-29 DIAGNOSIS — E039 Hypothyroidism, unspecified: Secondary | ICD-10-CM | POA: Diagnosis not present

## 2016-10-31 DIAGNOSIS — J3081 Allergic rhinitis due to animal (cat) (dog) hair and dander: Secondary | ICD-10-CM | POA: Diagnosis not present

## 2016-10-31 DIAGNOSIS — Z79891 Long term (current) use of opiate analgesic: Secondary | ICD-10-CM | POA: Diagnosis not present

## 2016-10-31 DIAGNOSIS — R05 Cough: Secondary | ICD-10-CM | POA: Diagnosis not present

## 2016-10-31 DIAGNOSIS — J302 Other seasonal allergic rhinitis: Secondary | ICD-10-CM | POA: Diagnosis not present

## 2016-11-01 DIAGNOSIS — J302 Other seasonal allergic rhinitis: Secondary | ICD-10-CM | POA: Diagnosis not present

## 2016-11-01 DIAGNOSIS — R05 Cough: Secondary | ICD-10-CM | POA: Diagnosis not present

## 2016-11-01 DIAGNOSIS — J3081 Allergic rhinitis due to animal (cat) (dog) hair and dander: Secondary | ICD-10-CM | POA: Diagnosis not present

## 2016-11-04 DIAGNOSIS — J3081 Allergic rhinitis due to animal (cat) (dog) hair and dander: Secondary | ICD-10-CM | POA: Diagnosis not present

## 2016-11-04 DIAGNOSIS — R05 Cough: Secondary | ICD-10-CM | POA: Diagnosis not present

## 2016-11-04 DIAGNOSIS — J302 Other seasonal allergic rhinitis: Secondary | ICD-10-CM | POA: Diagnosis not present

## 2016-11-05 DIAGNOSIS — J302 Other seasonal allergic rhinitis: Secondary | ICD-10-CM | POA: Diagnosis not present

## 2016-11-05 DIAGNOSIS — J3081 Allergic rhinitis due to animal (cat) (dog) hair and dander: Secondary | ICD-10-CM | POA: Diagnosis not present

## 2016-11-05 DIAGNOSIS — R05 Cough: Secondary | ICD-10-CM | POA: Diagnosis not present

## 2016-11-06 DIAGNOSIS — J302 Other seasonal allergic rhinitis: Secondary | ICD-10-CM | POA: Diagnosis not present

## 2016-11-06 DIAGNOSIS — R05 Cough: Secondary | ICD-10-CM | POA: Diagnosis not present

## 2016-11-06 DIAGNOSIS — J3081 Allergic rhinitis due to animal (cat) (dog) hair and dander: Secondary | ICD-10-CM | POA: Diagnosis not present

## 2016-11-07 DIAGNOSIS — J3081 Allergic rhinitis due to animal (cat) (dog) hair and dander: Secondary | ICD-10-CM | POA: Diagnosis not present

## 2016-11-07 DIAGNOSIS — R05 Cough: Secondary | ICD-10-CM | POA: Diagnosis not present

## 2016-11-07 DIAGNOSIS — J302 Other seasonal allergic rhinitis: Secondary | ICD-10-CM | POA: Diagnosis not present

## 2016-11-18 DIAGNOSIS — E039 Hypothyroidism, unspecified: Secondary | ICD-10-CM | POA: Diagnosis not present

## 2016-11-21 DIAGNOSIS — Z79891 Long term (current) use of opiate analgesic: Secondary | ICD-10-CM | POA: Diagnosis not present

## 2016-11-26 DIAGNOSIS — E039 Hypothyroidism, unspecified: Secondary | ICD-10-CM | POA: Diagnosis not present

## 2016-11-26 DIAGNOSIS — Z139 Encounter for screening, unspecified: Secondary | ICD-10-CM | POA: Diagnosis not present

## 2016-11-26 DIAGNOSIS — Z6821 Body mass index (BMI) 21.0-21.9, adult: Secondary | ICD-10-CM | POA: Diagnosis not present

## 2016-12-05 DIAGNOSIS — Z79891 Long term (current) use of opiate analgesic: Secondary | ICD-10-CM | POA: Diagnosis not present

## 2016-12-18 DIAGNOSIS — Z1389 Encounter for screening for other disorder: Secondary | ICD-10-CM | POA: Diagnosis not present

## 2016-12-18 DIAGNOSIS — R030 Elevated blood-pressure reading, without diagnosis of hypertension: Secondary | ICD-10-CM | POA: Diagnosis not present

## 2016-12-18 DIAGNOSIS — M62838 Other muscle spasm: Secondary | ICD-10-CM | POA: Diagnosis not present

## 2016-12-18 DIAGNOSIS — M47816 Spondylosis without myelopathy or radiculopathy, lumbar region: Secondary | ICD-10-CM | POA: Diagnosis not present

## 2016-12-18 DIAGNOSIS — J449 Chronic obstructive pulmonary disease, unspecified: Secondary | ICD-10-CM | POA: Diagnosis not present

## 2016-12-25 DIAGNOSIS — M545 Low back pain: Secondary | ICD-10-CM | POA: Diagnosis not present

## 2016-12-26 DIAGNOSIS — Z79891 Long term (current) use of opiate analgesic: Secondary | ICD-10-CM | POA: Diagnosis not present

## 2016-12-26 DIAGNOSIS — Z6821 Body mass index (BMI) 21.0-21.9, adult: Secondary | ICD-10-CM | POA: Diagnosis not present

## 2017-01-09 DIAGNOSIS — Z79891 Long term (current) use of opiate analgesic: Secondary | ICD-10-CM | POA: Diagnosis not present

## 2017-01-15 ENCOUNTER — Other Ambulatory Visit: Payer: Self-pay | Admitting: Orthopedic Surgery

## 2017-01-15 DIAGNOSIS — M545 Low back pain, unspecified: Secondary | ICD-10-CM

## 2017-01-23 DIAGNOSIS — Z79891 Long term (current) use of opiate analgesic: Secondary | ICD-10-CM | POA: Diagnosis not present

## 2017-01-24 DIAGNOSIS — Z23 Encounter for immunization: Secondary | ICD-10-CM | POA: Diagnosis not present

## 2017-01-24 DIAGNOSIS — E039 Hypothyroidism, unspecified: Secondary | ICD-10-CM | POA: Diagnosis not present

## 2017-01-24 DIAGNOSIS — M47816 Spondylosis without myelopathy or radiculopathy, lumbar region: Secondary | ICD-10-CM | POA: Diagnosis not present

## 2017-01-24 DIAGNOSIS — Z1321 Encounter for screening for nutritional disorder: Secondary | ICD-10-CM | POA: Diagnosis not present

## 2017-01-26 ENCOUNTER — Ambulatory Visit
Admission: RE | Admit: 2017-01-26 | Discharge: 2017-01-26 | Disposition: A | Discharge status: Home / Self Care | Payer: Medicare Other | Source: Ambulatory Visit | Attending: Orthopedic Surgery | Admitting: Orthopedic Surgery

## 2017-01-26 DIAGNOSIS — M545 Low back pain, unspecified: Secondary | ICD-10-CM

## 2017-01-26 DIAGNOSIS — M48061 Spinal stenosis, lumbar region without neurogenic claudication: Secondary | ICD-10-CM | POA: Diagnosis not present

## 2017-02-05 DIAGNOSIS — M47816 Spondylosis without myelopathy or radiculopathy, lumbar region: Secondary | ICD-10-CM | POA: Diagnosis not present

## 2017-02-05 DIAGNOSIS — Z79891 Long term (current) use of opiate analgesic: Secondary | ICD-10-CM | POA: Diagnosis not present

## 2017-02-06 ENCOUNTER — Other Ambulatory Visit: Payer: Self-pay | Admitting: Neurosurgery

## 2017-02-06 DIAGNOSIS — M4310 Spondylolisthesis, site unspecified: Secondary | ICD-10-CM | POA: Diagnosis not present

## 2017-02-06 DIAGNOSIS — I10 Essential (primary) hypertension: Secondary | ICD-10-CM | POA: Diagnosis not present

## 2017-02-17 ENCOUNTER — Encounter (HOSPITAL_COMMUNITY): Payer: Self-pay

## 2017-02-17 NOTE — Pre-Procedure Instructions (Signed)
[name]  02/17/2017      [facility] - [place] [place] - [address] [place] [id number] Phone: [phone] Fax: [phone]  [facility] [place] - [address] AT [facility] [address] [place] [address] Phone: [phone] Fax: [phone]    Your procedure is scheduled on Monday October 29.  [facility]00 [facility].M.  Call this number if you have problems the morning of surgery:  [phone]   Remember:  Do not eat food or drink liquids after midnight.  Take these medicines the morning of surgery with A SIP OF WATER:  nebivolol (Buystolic) solifenacin (vesicare) levothyroxine (synthroid) baclofen (lioresal) gabapentin (neurontin) acetaminophen (tylenol) if needed albuterol if needed alprazolam (Xanax)  CONTINUE all other prescribed medicines until the day before surgery.   7 days prior to surgery STOP taking any Aspirin (unless otherwise instructed by your doctor), Aleve, Naproxen, Ibuprofen, Motrin, Advil, Goody's, BC's, all herbal medications, fish oil, and all vitamins    Do not wear jewelry, make-up or nail polish.  Do not wear lotions, powders, or perfumes, or deoderant.  Do not shave 48 hours prior to surgery.  Men may shave face and neck.  Do not bring valuables to the hospital.  [facility] is not responsible for any belongings or valuables.  Contacts, dentures or bridgework may not be worn into surgery.  Leave your suitcase in the car.  After surgery it may be brought to your room.  For patients admitted to the hospital, discharge time will be determined by your treatment team.  Patients discharged the day of surgery will not be allowed to drive home.    Special instructions:    [facility]- Preparing For Surgery  Before surgery, you can play an important role. Because skin is not sterile, your  skin needs to be as free of germs as possible. You can reduce the number of germs on your skin by washing with CHG (chlorahexidine gluconate) Soap before surgery.  CHG is an antiseptic cleaner which kills germs and bonds with the skin to continue killing germs even after washing.  Please do not use if you have an allergy to CHG or antibacterial soaps. If your skin becomes reddened/irritated stop using the CHG.  Do not shave (including legs and underarms) for at least 48 hours prior to first CHG shower. It is OK to shave your face.  Please follow these instructions carefully.   1. Shower the NIGHT BEFORE SURGERY and the MORNING OF SURGERY with CHG.   2. If you chose to wash your hair, wash your hair first as usual with your normal shampoo.  3. After you shampoo, rinse your hair and body thoroughly to remove the shampoo.  4. Use CHG as you would any other liquid soap. You can apply CHG directly to the skin and wash gently with a scrungie or a clean washcloth.   5. Apply the CHG Soap to your body ONLY FROM THE NECK DOWN.  Do not use on open wounds or open sores. Avoid contact with your eyes, ears, mouth and genitals (private parts). Wash Face and genitals (private parts)  with your normal soap.  6. Wash thoroughly, paying special attention to the area where your surgery will be performed.  7. Thoroughly rinse your body with warm water from the neck down.  8. DO  NOT shower/wash with your normal soap after using and rinsing off the CHG Soap.  9. Pat yourself dry with a CLEAN TOWEL.  10. Wear CLEAN PAJAMAS to bed the night before surgery, wear comfortable clothes the morning of surgery  11. Place CLEAN SHEETS on your bed the night of your first shower and DO NOT SLEEP WITH PETS.    Day of Surgery: Do not apply any deodorants/lotions. Please wear clean clothes to the hospital/surgery center.      Please read over the following fact sheets that you were given. Coughing and Deep Breathing  and MRSA Information

## 2017-02-18 ENCOUNTER — Inpatient Hospital Stay (HOSPITAL_COMMUNITY)
Admission: RE | Admit: 2017-02-18 | Discharge: 2017-02-18 | Disposition: A | Discharge status: Home / Self Care | Payer: Medicare Other | Source: Ambulatory Visit

## 2017-02-19 DIAGNOSIS — M47816 Spondylosis without myelopathy or radiculopathy, lumbar region: Secondary | ICD-10-CM | POA: Diagnosis not present

## 2017-02-19 DIAGNOSIS — Z79891 Long term (current) use of opiate analgesic: Secondary | ICD-10-CM | POA: Diagnosis not present

## 2017-02-20 ENCOUNTER — Encounter (HOSPITAL_COMMUNITY): Payer: Self-pay

## 2017-02-20 ENCOUNTER — Encounter (HOSPITAL_COMMUNITY)
Admission: RE | Admit: 2017-02-20 | Discharge: 2017-02-20 | Disposition: A | Discharge status: Home / Self Care | Payer: Medicare Other | Source: Ambulatory Visit | Attending: Neurosurgery | Admitting: Neurosurgery

## 2017-02-20 DIAGNOSIS — Z01818 Encounter for other preprocedural examination: Secondary | ICD-10-CM | POA: Insufficient documentation

## 2017-02-20 DIAGNOSIS — M4316 Spondylolisthesis, lumbar region: Secondary | ICD-10-CM | POA: Insufficient documentation

## 2017-02-20 DIAGNOSIS — Z01810 Encounter for preprocedural cardiovascular examination: Secondary | ICD-10-CM | POA: Diagnosis not present

## 2017-02-20 HISTORY — DX: Other chronic pain: G89.29

## 2017-02-20 HISTORY — DX: Gastro-esophageal reflux disease without esophagitis: K21.9

## 2017-02-20 LAB — CBC WITH DIFFERENTIAL/PLATELET
Basophils Absolute: 0.1 10*3/uL (ref 0.0–0.1)
Basophils Relative: 1 %
EOS ABS: 0.2 10*3/uL (ref 0.0–0.7)
EOS PCT: 2 %
HCT: 35.9 % — ABNORMAL LOW (ref 36.0–46.0)
HEMOGLOBIN: 12.6 g/dL (ref 12.0–15.0)
LYMPHS ABS: 1.3 10*3/uL (ref 0.7–4.0)
Lymphocytes Relative: 15 %
MCH: 32.1 pg (ref 26.0–34.0)
MCHC: 35.1 g/dL (ref 30.0–36.0)
MCV: 91.6 fL (ref 78.0–100.0)
MONOS PCT: 6 %
Monocytes Absolute: 0.5 10*3/uL (ref 0.1–1.0)
Neutro Abs: 6.5 10*3/uL (ref 1.7–7.7)
Neutrophils Relative %: 76 %
Platelets: 262 10*3/uL (ref 150–400)
RBC: 3.92 MIL/uL (ref 3.87–5.11)
RDW: 12.8 % (ref 11.5–15.5)
WBC: 8.6 10*3/uL (ref 4.0–10.5)

## 2017-02-20 LAB — BASIC METABOLIC PANEL
ANION GAP: 9 (ref 5–15)
BUN: 8 mg/dL (ref 6–20)
CALCIUM: 9.3 mg/dL (ref 8.9–10.3)
CO2: 24 mmol/L (ref 22–32)
CREATININE: 0.96 mg/dL (ref 0.44–1.00)
Chloride: 97 mmol/L — ABNORMAL LOW (ref 101–111)
GFR calc Af Amer: >60 mL/min (ref >60)
GLUCOSE: 95 mg/dL (ref 65–99)
Potassium: 4.6 mmol/L (ref 3.5–5.1)
Sodium: 130 mmol/L — ABNORMAL LOW (ref 135–145)

## 2017-02-20 LAB — SURGICAL PCR SCREEN
MRSA, PCR: NEGATIVE
Staphylococcus aureus: NEGATIVE

## 2017-02-20 NOTE — Progress Notes (Signed)
Pt. Denies ever having seen a cardiologist or any cardiac testing.   Pt instructed to stop suboxone 3 days prior to surgery,

## 2017-02-24 ENCOUNTER — Encounter (HOSPITAL_COMMUNITY)
Admission: RE | Disposition: A | Discharge status: Home Health | Payer: Self-pay | Source: Ambulatory Visit | Attending: Neurosurgery

## 2017-02-24 ENCOUNTER — Inpatient Hospital Stay (HOSPITAL_COMMUNITY): Payer: Medicare Other | Admitting: Anesthesiology

## 2017-02-24 ENCOUNTER — Inpatient Hospital Stay (HOSPITAL_COMMUNITY)
Admission: RE | Admit: 2017-02-24 | Discharge: 2017-02-26 | DRG: 455 | Disposition: A | Discharge status: Home Health | Payer: Medicare Other | Source: Ambulatory Visit | Attending: Neurosurgery | Admitting: Neurosurgery

## 2017-02-24 ENCOUNTER — Encounter (HOSPITAL_COMMUNITY): Payer: Self-pay | Admitting: *Deleted

## 2017-02-24 ENCOUNTER — Inpatient Hospital Stay (HOSPITAL_COMMUNITY): Payer: Medicare Other

## 2017-02-24 DIAGNOSIS — M48061 Spinal stenosis, lumbar region without neurogenic claudication: Secondary | ICD-10-CM | POA: Diagnosis present

## 2017-02-24 DIAGNOSIS — Z87891 Personal history of nicotine dependence: Secondary | ICD-10-CM

## 2017-02-24 DIAGNOSIS — M5136 Other intervertebral disc degeneration, lumbar region: Secondary | ICD-10-CM | POA: Diagnosis present

## 2017-02-24 DIAGNOSIS — M4316 Spondylolisthesis, lumbar region: Secondary | ICD-10-CM | POA: Diagnosis not present

## 2017-02-24 DIAGNOSIS — I10 Essential (primary) hypertension: Secondary | ICD-10-CM | POA: Diagnosis present

## 2017-02-24 DIAGNOSIS — M4180 Other forms of scoliosis, site unspecified: Secondary | ICD-10-CM | POA: Diagnosis present

## 2017-02-24 DIAGNOSIS — K219 Gastro-esophageal reflux disease without esophagitis: Secondary | ICD-10-CM | POA: Diagnosis present

## 2017-02-24 DIAGNOSIS — F419 Anxiety disorder, unspecified: Secondary | ICD-10-CM | POA: Diagnosis present

## 2017-02-24 DIAGNOSIS — Z981 Arthrodesis status: Secondary | ICD-10-CM | POA: Diagnosis not present

## 2017-02-24 DIAGNOSIS — Z419 Encounter for procedure for purposes other than remedying health state, unspecified: Secondary | ICD-10-CM

## 2017-02-24 DIAGNOSIS — F329 Major depressive disorder, single episode, unspecified: Secondary | ICD-10-CM | POA: Diagnosis present

## 2017-02-24 DIAGNOSIS — M4326 Fusion of spine, lumbar region: Secondary | ICD-10-CM | POA: Diagnosis not present

## 2017-02-24 DIAGNOSIS — Z79899 Other long term (current) drug therapy: Secondary | ICD-10-CM | POA: Diagnosis not present

## 2017-02-24 DIAGNOSIS — G8929 Other chronic pain: Secondary | ICD-10-CM | POA: Diagnosis not present

## 2017-02-24 DIAGNOSIS — Z7982 Long term (current) use of aspirin: Secondary | ICD-10-CM | POA: Diagnosis not present

## 2017-02-24 DIAGNOSIS — E785 Hyperlipidemia, unspecified: Secondary | ICD-10-CM | POA: Diagnosis not present

## 2017-02-24 DIAGNOSIS — M4310 Spondylolisthesis, site unspecified: Secondary | ICD-10-CM | POA: Diagnosis present

## 2017-02-24 DIAGNOSIS — E039 Hypothyroidism, unspecified: Secondary | ICD-10-CM | POA: Diagnosis not present

## 2017-02-24 LAB — TYPE AND SCREEN
ABO/RH(D): B POS
ANTIBODY SCREEN: POSITIVE

## 2017-02-24 SURGERY — POSTERIOR LUMBAR FUSION 2 LEVEL
Anesthesia: General | Site: Back

## 2017-02-24 MED ORDER — GUAIFENESIN ER 600 MG PO TB12
1200.0000 mg | ORAL_TABLET | Freq: Two times a day (BID) | ORAL | Status: DC
  Administered 2017-02-24 – 2017-02-25 (×3): 1200 mg via ORAL
  Filled 2017-02-24 (×4): qty 2

## 2017-02-24 MED ORDER — BUPRENORPHINE HCL 8 MG SL SUBL: 8.0000 mg | SUBLINGUAL_TABLET | Freq: Every day | SUBLINGUAL | Status: DC

## 2017-02-24 MED ORDER — ROCURONIUM BROMIDE 10 MG/ML (PF) SYRINGE
PREFILLED_SYRINGE | INTRAVENOUS | Status: AC
  Filled 2017-02-24: qty 10

## 2017-02-24 MED ORDER — SODIUM CHLORIDE 0.9% FLUSH: 3.0000 mL | INTRAVENOUS | Status: DC | PRN

## 2017-02-24 MED ORDER — PHENYLEPHRINE 40 MCG/ML (10ML) SYRINGE FOR IV PUSH (FOR BLOOD PRESSURE SUPPORT)
PREFILLED_SYRINGE | INTRAVENOUS | Status: AC
  Filled 2017-02-24: qty 10

## 2017-02-24 MED ORDER — ROCURONIUM BROMIDE 10 MG/ML (PF) SYRINGE
PREFILLED_SYRINGE | INTRAVENOUS | Status: AC
  Filled 2017-02-24: qty 5

## 2017-02-24 MED ORDER — ONDANSETRON HCL 4 MG/2ML IJ SOLN
INTRAMUSCULAR | Status: DC | PRN
  Administered 2017-02-24: 4 mg via INTRAVENOUS

## 2017-02-24 MED ORDER — LOPERAMIDE HCL 2 MG PO TABS: 2.0000 mg | ORAL_TABLET | ORAL | Status: DC | PRN

## 2017-02-24 MED ORDER — HYDROMORPHONE HCL 1 MG/ML IJ SOLN
INTRAMUSCULAR | Status: DC | PRN
  Administered 2017-02-24: 0.5 mg via INTRAVENOUS
  Administered 2017-02-24: .5 mg via INTRAVENOUS

## 2017-02-24 MED ORDER — DEXAMETHASONE SODIUM PHOSPHATE 10 MG/ML IJ SOLN
INTRAMUSCULAR | Status: AC
  Filled 2017-02-24: qty 1

## 2017-02-24 MED ORDER — HYDROMORPHONE HCL 1 MG/ML IJ SOLN
INTRAMUSCULAR | Status: AC
  Administered 2017-02-24: 0.5 mg via INTRAVENOUS
  Filled 2017-02-24: qty 1

## 2017-02-24 MED ORDER — CEFAZOLIN SODIUM-DEXTROSE 2-4 GM/100ML-% IV SOLN
2.0000 g | INTRAVENOUS | Status: AC
  Administered 2017-02-24: 2 g via INTRAVENOUS

## 2017-02-24 MED ORDER — GABAPENTIN 400 MG PO CAPS
800.0000 mg | ORAL_CAPSULE | Freq: Four times a day (QID) | ORAL | Status: DC
  Administered 2017-02-24 – 2017-02-26 (×7): 800 mg via ORAL
  Filled 2017-02-24 (×8): qty 2

## 2017-02-24 MED ORDER — MIRTAZAPINE 45 MG PO TABS
45.0000 mg | ORAL_TABLET | Freq: Every day | ORAL | Status: DC
  Administered 2017-02-24 – 2017-02-25 (×2): 45 mg via ORAL
  Filled 2017-02-24 (×2): qty 1

## 2017-02-24 MED ORDER — 0.9 % SODIUM CHLORIDE (POUR BTL) OPTIME
TOPICAL | Status: DC | PRN
  Administered 2017-02-24: 1000 mL

## 2017-02-24 MED ORDER — BACITRACIN 50000 UNITS IM SOLR
INTRAMUSCULAR | Status: DC | PRN
  Administered 2017-02-24: 14:00:00

## 2017-02-24 MED ORDER — SUGAMMADEX SODIUM 200 MG/2ML IV SOLN
INTRAVENOUS | Status: AC
  Filled 2017-02-24: qty 2

## 2017-02-24 MED ORDER — CEFAZOLIN SODIUM-DEXTROSE 1-4 GM/50ML-% IV SOLN
1.0000 g | Freq: Three times a day (TID) | INTRAVENOUS | Status: AC
  Administered 2017-02-24 – 2017-02-25 (×2): 1 g via INTRAVENOUS
  Filled 2017-02-24 (×2): qty 50

## 2017-02-24 MED ORDER — ACETAMINOPHEN 325 MG PO TABS: 650.0000 mg | ORAL_TABLET | ORAL | Status: DC | PRN

## 2017-02-24 MED ORDER — AMITRIPTYLINE HCL 50 MG PO TABS
50.0000 mg | ORAL_TABLET | Freq: Every day | ORAL | Status: DC
  Administered 2017-02-24 – 2017-02-25 (×2): 50 mg via ORAL
  Filled 2017-02-24 (×2): qty 1

## 2017-02-24 MED ORDER — HYDROMORPHONE HCL 1 MG/ML IJ SOLN
0.2500 mg | INTRAMUSCULAR | Status: DC | PRN
  Administered 2017-02-24 (×4): 0.5 mg via INTRAVENOUS

## 2017-02-24 MED ORDER — ALBUTEROL SULFATE (2.5 MG/3ML) 0.083% IN NEBU: 2.5000 mg | INHALATION_SOLUTION | Freq: Four times a day (QID) | RESPIRATORY_TRACT | Status: DC | PRN

## 2017-02-24 MED ORDER — BUPIVACAINE HCL (PF) 0.25 % IJ SOLN
INTRAMUSCULAR | Status: AC
  Filled 2017-02-24: qty 30

## 2017-02-24 MED ORDER — ACETAMINOPHEN 650 MG RE SUPP: 650.0000 mg | RECTAL | Status: DC | PRN

## 2017-02-24 MED ORDER — ALPRAZOLAM 0.25 MG PO TABS
ORAL_TABLET | ORAL | Status: AC
  Filled 2017-02-24: qty 1

## 2017-02-24 MED ORDER — THROMBIN (RECOMBINANT) 20000 UNITS EX SOLR
CUTANEOUS | Status: AC
  Filled 2017-02-24: qty 20000

## 2017-02-24 MED ORDER — TRAMADOL HCL 50 MG PO TABS
ORAL_TABLET | ORAL | Status: AC
  Filled 2017-02-24: qty 1

## 2017-02-24 MED ORDER — HYDROCODONE-ACETAMINOPHEN 7.5-325 MG PO TABS
1.0000 | ORAL_TABLET | ORAL | Status: DC | PRN
  Administered 2017-02-25: 2 via ORAL
  Filled 2017-02-24: qty 2

## 2017-02-24 MED ORDER — ONDANSETRON HCL 4 MG PO TABS: 4.0000 mg | ORAL_TABLET | Freq: Four times a day (QID) | ORAL | Status: DC | PRN

## 2017-02-24 MED ORDER — MIDAZOLAM HCL 2 MG/2ML IJ SOLN
INTRAMUSCULAR | Status: AC
  Filled 2017-02-24: qty 2

## 2017-02-24 MED ORDER — CEFAZOLIN SODIUM-DEXTROSE 2-4 GM/100ML-% IV SOLN
INTRAVENOUS | Status: AC
  Filled 2017-02-24: qty 100

## 2017-02-24 MED ORDER — HYDROMORPHONE HCL 1 MG/ML IJ SOLN
0.5000 mg | Freq: Once | INTRAMUSCULAR | Status: AC
  Administered 2017-02-24: 0.5 mg via INTRAVENOUS

## 2017-02-24 MED ORDER — SUGAMMADEX SODIUM 200 MG/2ML IV SOLN
INTRAVENOUS | Status: DC | PRN
  Administered 2017-02-24: 200 mg via INTRAVENOUS

## 2017-02-24 MED ORDER — PHENOL 1.4 % MT LIQD: 1.0000 | OROMUCOSAL | Status: DC | PRN

## 2017-02-24 MED ORDER — LACTATED RINGERS IV SOLN
INTRAVENOUS | Status: DC
  Administered 2017-02-24: 11:00:00 via INTRAVENOUS

## 2017-02-24 MED ORDER — PROPOFOL 10 MG/ML IV BOLUS
INTRAVENOUS | Status: AC
  Filled 2017-02-24: qty 20

## 2017-02-24 MED ORDER — VANCOMYCIN HCL 1000 MG IV SOLR
INTRAVENOUS | Status: AC
  Filled 2017-02-24: qty 1000

## 2017-02-24 MED ORDER — ALBUMIN HUMAN 5 % IV SOLN
INTRAVENOUS | Status: DC | PRN
  Administered 2017-02-24: 14:00:00 via INTRAVENOUS

## 2017-02-24 MED ORDER — CHLORHEXIDINE GLUCONATE CLOTH 2 % EX PADS: 6.0000 | MEDICATED_PAD | Freq: Once | CUTANEOUS | Status: DC

## 2017-02-24 MED ORDER — DEXAMETHASONE SODIUM PHOSPHATE 10 MG/ML IJ SOLN
10.0000 mg | INTRAMUSCULAR | Status: AC
  Administered 2017-02-24: 10 mg via INTRAVENOUS

## 2017-02-24 MED ORDER — FENTANYL CITRATE (PF) 250 MCG/5ML IJ SOLN
INTRAMUSCULAR | Status: AC
  Filled 2017-02-24: qty 5

## 2017-02-24 MED ORDER — FENTANYL CITRATE (PF) 250 MCG/5ML IJ SOLN
INTRAMUSCULAR | Status: DC | PRN
  Administered 2017-02-24 (×2): 50 ug via INTRAVENOUS
  Administered 2017-02-24: 100 ug via INTRAVENOUS
  Administered 2017-02-24: 50 ug via INTRAVENOUS
  Administered 2017-02-24: 150 ug via INTRAVENOUS
  Administered 2017-02-24: 100 ug via INTRAVENOUS

## 2017-02-24 MED ORDER — DICLOFENAC SODIUM 50 MG PO TBEC: 50.0000 mg | DELAYED_RELEASE_TABLET | Freq: Four times a day (QID) | ORAL | Status: DC

## 2017-02-24 MED ORDER — BACLOFEN 20 MG PO TABS
20.0000 mg | ORAL_TABLET | Freq: Three times a day (TID) | ORAL | Status: DC
  Administered 2017-02-24 – 2017-02-26 (×5): 20 mg via ORAL
  Filled 2017-02-24 (×6): qty 1

## 2017-02-24 MED ORDER — KETOROLAC TROMETHAMINE 30 MG/ML IJ SOLN
30.0000 mg | Freq: Four times a day (QID) | INTRAMUSCULAR | Status: AC
  Administered 2017-02-24 – 2017-02-26 (×6): 30 mg via INTRAVENOUS
  Filled 2017-02-24 (×7): qty 1

## 2017-02-24 MED ORDER — ONDANSETRON HCL 4 MG/2ML IJ SOLN: 4.0000 mg | Freq: Four times a day (QID) | INTRAMUSCULAR | Status: DC | PRN

## 2017-02-24 MED ORDER — HYDROMORPHONE HCL 2 MG PO TABS
2.0000 mg | ORAL_TABLET | ORAL | Status: DC | PRN
  Administered 2017-02-24 – 2017-02-25 (×3): 2 mg via ORAL
  Filled 2017-02-24 (×3): qty 1

## 2017-02-24 MED ORDER — SODIUM CHLORIDE 0.9 % IV SOLN: 250.0000 mL | INTRAVENOUS | Status: DC

## 2017-02-24 MED ORDER — HYDROMORPHONE HCL 1 MG/ML IJ SOLN
INTRAMUSCULAR | Status: AC
  Filled 2017-02-24: qty 0.5

## 2017-02-24 MED ORDER — HYDROMORPHONE HCL 1 MG/ML IJ SOLN
INTRAMUSCULAR | Status: AC
  Filled 2017-02-24: qty 1

## 2017-02-24 MED ORDER — VANCOMYCIN HCL 1000 MG IV SOLR
INTRAVENOUS | Status: DC | PRN
  Administered 2017-02-24: 1000 mg via TOPICAL

## 2017-02-24 MED ORDER — SODIUM CHLORIDE 0.9% FLUSH
3.0000 mL | Freq: Two times a day (BID) | INTRAVENOUS | Status: DC
  Administered 2017-02-25 (×2): 3 mL via INTRAVENOUS

## 2017-02-24 MED ORDER — EPHEDRINE SULFATE-NACL 50-0.9 MG/10ML-% IV SOSY
PREFILLED_SYRINGE | INTRAVENOUS | Status: DC | PRN
  Administered 2017-02-24: 10 mg via INTRAVENOUS
  Administered 2017-02-24: 15 mg via INTRAVENOUS

## 2017-02-24 MED ORDER — LIDOCAINE 2% (20 MG/ML) 5 ML SYRINGE
INTRAMUSCULAR | Status: AC
  Filled 2017-02-24: qty 5

## 2017-02-24 MED ORDER — HYDROCODONE-ACETAMINOPHEN 7.5-325 MG PO TABS: 1.0000 | ORAL_TABLET | ORAL | Status: DC | PRN

## 2017-02-24 MED ORDER — ALPRAZOLAM 0.25 MG PO TABS
0.2500 mg | ORAL_TABLET | Freq: Two times a day (BID) | ORAL | Status: DC
  Administered 2017-02-25 – 2017-02-26 (×3): 0.25 mg via ORAL
  Filled 2017-02-24 (×3): qty 1

## 2017-02-24 MED ORDER — NEBIVOLOL HCL 2.5 MG PO TABS
2.5000 mg | ORAL_TABLET | Freq: Every day | ORAL | Status: DC
  Administered 2017-02-25: 2.5 mg via ORAL
  Filled 2017-02-24 (×2): qty 1

## 2017-02-24 MED ORDER — TRAMADOL HCL 50 MG PO TABS
50.0000 mg | ORAL_TABLET | Freq: Four times a day (QID) | ORAL | Status: DC | PRN
  Administered 2017-02-24: 50 mg via ORAL

## 2017-02-24 MED ORDER — HYDROCODONE-ACETAMINOPHEN 7.5-325 MG PO TABS
1.0000 | ORAL_TABLET | ORAL | Status: DC | PRN
  Administered 2017-02-24: 2 via ORAL
  Filled 2017-02-24: qty 2

## 2017-02-24 MED ORDER — PROPOFOL 10 MG/ML IV BOLUS
INTRAVENOUS | Status: DC | PRN
  Administered 2017-02-24: 150 mg via INTRAVENOUS

## 2017-02-24 MED ORDER — DEXMEDETOMIDINE HCL 200 MCG/2ML IV SOLN
INTRAVENOUS | Status: DC | PRN
  Administered 2017-02-24: 8 ug via INTRAVENOUS
  Administered 2017-02-24: 12 ug via INTRAVENOUS

## 2017-02-24 MED ORDER — DIAZEPAM 5 MG PO TABS
5.0000 mg | ORAL_TABLET | Freq: Four times a day (QID) | ORAL | Status: DC | PRN
  Administered 2017-02-24: 5 mg via ORAL
  Administered 2017-02-25: 10 mg via ORAL
  Administered 2017-02-25: 5 mg via ORAL
  Filled 2017-02-24: qty 2
  Filled 2017-02-24 (×2): qty 1
  Filled 2017-02-24: qty 2

## 2017-02-24 MED ORDER — PHENYLEPHRINE 40 MCG/ML (10ML) SYRINGE FOR IV PUSH (FOR BLOOD PRESSURE SUPPORT)
PREFILLED_SYRINGE | INTRAVENOUS | Status: DC | PRN
  Administered 2017-02-24: 40 ug via INTRAVENOUS

## 2017-02-24 MED ORDER — ONDANSETRON HCL 4 MG/2ML IJ SOLN
INTRAMUSCULAR | Status: AC
  Filled 2017-02-24: qty 2

## 2017-02-24 MED ORDER — LEVOTHYROXINE SODIUM 75 MCG PO TABS
75.0000 ug | ORAL_TABLET | Freq: Every day | ORAL | Status: DC
  Administered 2017-02-25 – 2017-02-26 (×2): 75 ug via ORAL
  Filled 2017-02-24 (×2): qty 1

## 2017-02-24 MED ORDER — BUPIVACAINE HCL (PF) 0.25 % IJ SOLN
INTRAMUSCULAR | Status: DC | PRN
  Administered 2017-02-24: 30 mL

## 2017-02-24 MED ORDER — ROFLUMILAST 500 MCG PO TABS
500.0000 ug | ORAL_TABLET | Freq: Every day | ORAL | Status: DC
  Administered 2017-02-24 – 2017-02-25 (×2): 500 ug via ORAL
  Filled 2017-02-24 (×3): qty 1

## 2017-02-24 MED ORDER — FLUOXETINE HCL 20 MG PO CAPS
20.0000 mg | ORAL_CAPSULE | Freq: Every day | ORAL | Status: DC
  Administered 2017-02-24 – 2017-02-25 (×2): 20 mg via ORAL
  Filled 2017-02-24 (×2): qty 1

## 2017-02-24 MED ORDER — TRIMETHOPRIM 100 MG PO TABS
100.0000 mg | ORAL_TABLET | Freq: Every day | ORAL | Status: DC
  Administered 2017-02-24 – 2017-02-25 (×2): 100 mg via ORAL
  Filled 2017-02-24 (×2): qty 1

## 2017-02-24 MED ORDER — THROMBIN (RECOMBINANT) 20000 UNITS EX SOLR
CUTANEOUS | Status: DC | PRN
  Administered 2017-02-24: 14:00:00 via TOPICAL

## 2017-02-24 MED ORDER — ALPRAZOLAM 0.25 MG PO TABS
0.2500 mg | ORAL_TABLET | Freq: Three times a day (TID) | ORAL | Status: DC
  Administered 2017-02-24: 0.25 mg via ORAL

## 2017-02-24 MED ORDER — FLUOROMETHOLONE 0.1 % OP SUSP
1.0000 [drp] | Freq: Two times a day (BID) | OPHTHALMIC | Status: DC
  Administered 2017-02-24 – 2017-02-25 (×3): 1 [drp] via OPHTHALMIC
  Filled 2017-02-24: qty 5

## 2017-02-24 MED ORDER — ROCURONIUM BROMIDE 10 MG/ML (PF) SYRINGE
PREFILLED_SYRINGE | INTRAVENOUS | Status: DC | PRN
  Administered 2017-02-24: 20 mg via INTRAVENOUS
  Administered 2017-02-24 (×3): 50 mg via INTRAVENOUS
  Administered 2017-02-24 (×2): 25 mg via INTRAVENOUS

## 2017-02-24 MED ORDER — DARIFENACIN HYDROBROMIDE ER 7.5 MG PO TB24
7.5000 mg | ORAL_TABLET | Freq: Every day | ORAL | Status: DC
  Administered 2017-02-25: 7.5 mg via ORAL
  Filled 2017-02-24 (×2): qty 1

## 2017-02-24 MED ORDER — ALPRAZOLAM 0.5 MG PO TABS
0.5000 mg | ORAL_TABLET | Freq: Every day | ORAL | Status: DC
  Administered 2017-02-24 – 2017-02-25 (×2): 0.5 mg via ORAL
  Filled 2017-02-24 (×2): qty 1

## 2017-02-24 MED ORDER — MENTHOL 3 MG MT LOZG: 1.0000 | LOZENGE | OROMUCOSAL | Status: DC | PRN

## 2017-02-24 MED ORDER — LACTATED RINGERS IV SOLN
INTRAVENOUS | Status: DC | PRN
  Administered 2017-02-24 (×3): via INTRAVENOUS

## 2017-02-24 MED ORDER — EPHEDRINE 5 MG/ML INJ
INTRAVENOUS | Status: AC
  Filled 2017-02-24: qty 10

## 2017-02-24 MED ORDER — LIDOCAINE 2% (20 MG/ML) 5 ML SYRINGE
INTRAMUSCULAR | Status: DC | PRN
  Administered 2017-02-24: 50 mg via INTRAVENOUS

## 2017-02-24 MED ORDER — MIDAZOLAM HCL 2 MG/2ML IJ SOLN
INTRAMUSCULAR | Status: DC | PRN
  Administered 2017-02-24: 2 mg via INTRAVENOUS

## 2017-02-24 MED ORDER — GLYCOPYRROLATE 0.2 MG/ML IV SOSY
PREFILLED_SYRINGE | INTRAVENOUS | Status: DC | PRN
  Administered 2017-02-24: .2 mg via INTRAVENOUS

## 2017-02-24 MED ORDER — MISOPROSTOL 200 MCG PO TABS
200.0000 ug | ORAL_TABLET | Freq: Four times a day (QID) | ORAL | Status: DC
  Filled 2017-02-24 (×2): qty 1

## 2017-02-24 MED ORDER — ONDANSETRON HCL 4 MG/2ML IJ SOLN
4.0000 mg | Freq: Four times a day (QID) | INTRAMUSCULAR | Status: DC | PRN
  Administered 2017-02-24: 4 mg via INTRAVENOUS
  Filled 2017-02-24: qty 2

## 2017-02-24 SURGICAL SUPPLY — 61 items
BAG DECANTER FOR FLEXI CONT (MISCELLANEOUS) ×3 IMPLANT
BENZOIN TINCTURE PRP APPL 2/3 (GAUZE/BANDAGES/DRESSINGS) ×3 IMPLANT
BLADE CLIPPER SURG (BLADE) IMPLANT
BUR CUTTER 7.0 ROUND (BURR) ×3 IMPLANT
BUR MATCHSTICK NEURO 3.0 LAGG (BURR) ×6 IMPLANT
CANISTER SUCT 3000ML PPV (MISCELLANEOUS) IMPLANT
CAP LCK SPNE (Orthopedic Implant) ×4 IMPLANT
CAP LOCK SPINE RADIUS (Orthopedic Implant) ×4 IMPLANT
CAP LOCKING (Orthopedic Implant) ×8 IMPLANT
CARTRIDGE OIL MAESTRO DRILL (MISCELLANEOUS) ×1 IMPLANT
CLOSURE WOUND 1/2 X4 (GAUZE/BANDAGES/DRESSINGS) ×1
CONT SPEC 4OZ CLIKSEAL STRL BL (MISCELLANEOUS) ×3 IMPLANT
COVER BACK TABLE 60X90IN (DRAPES) ×3 IMPLANT
DECANTER SPIKE VIAL GLASS SM (MISCELLANEOUS) ×3 IMPLANT
DERMABOND ADVANCED (GAUZE/BANDAGES/DRESSINGS) ×4
DERMABOND ADVANCED .7 DNX12 (GAUZE/BANDAGES/DRESSINGS) ×2 IMPLANT
DEVICE INTERBODY ELEVATE 9X23 (Cage) ×12 IMPLANT
DIFFUSER DRILL AIR PNEUMATIC (MISCELLANEOUS) ×3 IMPLANT
DRAPE C-ARM 42X72 X-RAY (DRAPES) ×6 IMPLANT
DRAPE HALF SHEET 40X57 (DRAPES) IMPLANT
DRAPE LAPAROTOMY 100X72X124 (DRAPES) ×3 IMPLANT
DRAPE POUCH INSTRU U-SHP 10X18 (DRAPES) ×3 IMPLANT
DRAPE SURG 17X23 STRL (DRAPES) ×12 IMPLANT
DRSG OPSITE POSTOP 4X8 (GAUZE/BANDAGES/DRESSINGS) ×3 IMPLANT
DURAPREP 26ML APPLICATOR (WOUND CARE) ×3 IMPLANT
ELECT REM PT RETURN 9FT ADLT (ELECTROSURGICAL) ×3
ELECTRODE REM PT RTRN 9FT ADLT (ELECTROSURGICAL) ×1 IMPLANT
EVACUATOR 1/8 PVC DRAIN (DRAIN) IMPLANT
GAUZE SPONGE 4X4 12PLY STRL (GAUZE/BANDAGES/DRESSINGS) IMPLANT
GAUZE SPONGE 4X4 16PLY XRAY LF (GAUZE/BANDAGES/DRESSINGS) ×3 IMPLANT
GLOVE ECLIPSE 6.5 STRL STRAW (GLOVE) ×3 IMPLANT
GLOVE ECLIPSE 9.0 STRL (GLOVE) ×6 IMPLANT
GLOVE EXAM NITRILE LRG STRL (GLOVE) IMPLANT
GLOVE EXAM NITRILE XL STR (GLOVE) IMPLANT
GLOVE EXAM NITRILE XS STR PU (GLOVE) IMPLANT
GOWN STRL REUS W/ TWL LRG LVL3 (GOWN DISPOSABLE) IMPLANT
GOWN STRL REUS W/ TWL XL LVL3 (GOWN DISPOSABLE) ×2 IMPLANT
GOWN STRL REUS W/TWL 2XL LVL3 (GOWN DISPOSABLE) IMPLANT
GOWN STRL REUS W/TWL LRG LVL3 (GOWN DISPOSABLE)
GOWN STRL REUS W/TWL XL LVL3 (GOWN DISPOSABLE) ×4
KIT BASIN OR (CUSTOM PROCEDURE TRAY) ×3 IMPLANT
KIT ROOM TURNOVER OR (KITS) ×3 IMPLANT
MILL MEDIUM DISP (BLADE) ×3 IMPLANT
NEEDLE HYPO 22GX1.5 SAFETY (NEEDLE) ×3 IMPLANT
NS IRRIG 1000ML POUR BTL (IV SOLUTION) ×3 IMPLANT
OIL CARTRIDGE MAESTRO DRILL (MISCELLANEOUS) ×3
PACK LAMINECTOMY NEURO (CUSTOM PROCEDURE TRAY) ×3 IMPLANT
ROD 80MM (Rod) ×4 IMPLANT
ROD SPNL 80X5.5 NS TI RDS (Rod) ×2 IMPLANT
SCREW 5.75X45MM (Screw) ×12 IMPLANT
SCREW LOCK 90D ANGLED 3.8X22 (Screw) ×6 IMPLANT
SPONGE SURGIFOAM ABS GEL 100 (HEMOSTASIS) ×3 IMPLANT
STRIP CLOSURE SKIN 1/2X4 (GAUZE/BANDAGES/DRESSINGS) ×2 IMPLANT
SUT VIC AB 0 CT1 18XCR BRD8 (SUTURE) ×2 IMPLANT
SUT VIC AB 0 CT1 8-18 (SUTURE) ×4
SUT VIC AB 2-0 CT1 18 (SUTURE) ×3 IMPLANT
SUT VIC AB 3-0 SH 8-18 (SUTURE) ×6 IMPLANT
TOWEL GREEN STERILE (TOWEL DISPOSABLE) ×3 IMPLANT
TOWEL GREEN STERILE FF (TOWEL DISPOSABLE) ×3 IMPLANT
TRAY FOLEY W/METER SILVER 16FR (SET/KITS/TRAYS/PACK) ×3 IMPLANT
WATER STERILE IRR 1000ML POUR (IV SOLUTION) ×3 IMPLANT

## 2017-02-24 NOTE — Brief Op Note (Signed)
02/24/2017  4:23 PM  PATIENT:  [name]  66 y.o. female  PRE-OPERATIVE DIAGNOSIS:  Spondylolisthesis  POST-OPERATIVE DIAGNOSIS:  Spondylolisthesis  PROCEDURE:  Procedure(s): Posterior Lumbar Interbody Fusion - Lumbar Two-Lumbar Three - Lumbar Three-Lumbar Four (N/A)  SURGEON:  Surgeon(s) and Role:    * Julio SicksPool, Katherine Syme, MD - Primary    * Coletta Memosabbell, Kyle, MD - Assisting  PHYSICIAN ASSISTANT:   ASSISTANTS:    ANESTHESIA:   general  EBL:  600 mL   BLOOD ADMINISTERED:none  DRAINS: none   LOCAL MEDICATIONS USED:  MARCAINE     SPECIMEN:  No Specimen  DISPOSITION OF SPECIMEN:  N/A  COUNTS:  YES  TOURNIQUET:  * No tourniquets in log *  DICTATION: .Dragon Dictation  PLAN OF CARE: Admit to inpatient   PATIENT DISPOSITION:  PACU - hemodynamically stable.   Delay start of Pharmacological VTE agent (>24hrs) due to surgical blood loss or risk of bleeding: yes

## 2017-02-24 NOTE — Anesthesia Procedure Notes (Signed)
Procedure Name: Intubation Date/Time: 02/24/2017 1:01 PM Performed by: Teressa Lower Pre-anesthesia Checklist: Patient identified, Emergency Drugs available, Suction available and Patient being monitored Patient Re-evaluated:Patient Re-evaluated prior to induction Oxygen Delivery Method: Circle system utilized Preoxygenation: Pre-oxygenation with 100% oxygen Induction Type: IV induction Ventilation: Mask ventilation without difficulty Laryngoscope Size: Mac and 3 Grade View: Grade I Tube type: Oral Tube size: 7.5 mm Number of attempts: 1 Airway Equipment and Method: Stylet and Oral airway Placement Confirmation: ETT inserted through vocal cords under direct vision,  positive ETCO2 and breath sounds checked- equal and bilateral Secured at: 21 cm Tube secured with: Tape Dental Injury: Teeth and Oropharynx as per pre-operative assessment

## 2017-02-24 NOTE — Transfer of Care (Signed)
Immediate Anesthesia Transfer of Care Note  Patient: [name]  Procedure(s) Performed: Posterior Lumbar Interbody Fusion - Lumbar Two-Lumbar Three - Lumbar Three-Lumbar Four (N/A Back)  Patient Location: PACU  Anesthesia Type:General  Level of Consciousness: awake, alert  and oriented  Airway & Oxygen Therapy: Patient Spontanous Breathing and Patient connected to face mask oxygen  Post-op Assessment: Report given to RN and Post -op Vital signs reviewed and stable  Post vital signs: Reviewed and stable  Last Vitals:  Vitals:   02/24/17 0955  BP: (!) 148/99  Pulse: 64  Resp: 18  SpO2: 97%    Last Pain:  Vitals:   02/24/17 1120  TempSrc:   PainSc: 4       Patients Stated Pain Goal: 4 (02/24/17 1035)  Complications: No apparent anesthesia complications

## 2017-02-24 NOTE — Anesthesia Postprocedure Evaluation (Signed)
Anesthesia Post Note  Patient: Jeffie A Kever  Procedure(s) Performed: Posterior Lumbar Interbody Fusion - Lumbar Two-Lumbar Three - Lumbar Three-Lumbar Four (N/A Back)     Patient location during evaluation: PACU Anesthesia Type: General Level of consciousness: awake and alert Pain management: pain level controlled Vital Signs Assessment: post-procedure vital signs reviewed and stable Respiratory status: spontaneous breathing, nonlabored ventilation, respiratory function stable and patient connected to nasal cannula oxygen Cardiovascular status: blood pressure returned to baseline and stable Postop Assessment: no apparent nausea or vomiting Anesthetic complications: no    Last Vitals:  Vitals:   02/24/17 1707 02/24/17 1715  BP: 136/81   Pulse: 69 74  Resp: 10 (!) 22  Temp:    SpO2: 100% 99%    Last Pain:  Vitals:   02/24/17 1120  TempSrc:   PainSc: 4                  Dimonique Bourdeau

## 2017-02-24 NOTE — Op Note (Signed)
Date of procedure: 02/24/2017  Date of dictation: Same  Service: Neurosurgery  Preoperative diagnosis: L2-3, L3-4 degenerative spondylolisthesis with stenosis  Postoperative diagnosis: Same  Procedure Name: Bilateral L2-3 and L3-4 decompressive laminotomies with foraminotomies; more than would be required for simple interbody fusion alone.  L2-3, L3-4 posterior lumbar interbody fusion utilizing interbody cages and locally harvested autograft  L2-3 4 posterior lateral arthrodesis utilizing segmental pedicle screw fixation and local autograft  Reexploration of prior L4-5 fusion with removal of hardware  Surgeon:Lucille Witts A.Baya Lentz, M.D.  Asst. Surgeon: Franky Machoabbell  Anesthesia: General  Indication: Patient is a 66 year old female status post prior L5-S1 and L4-5 fusion surgeries by another physician.  Patient with chronic back and radicular pain failing all conservative management.  Workup demonstrates evidence of marked lateral listhesis and some retrolisthesis at L2-3 and L3-4 with marked disc space collapse and neuroforaminal stenosis.  Patient presents now for decompression and fusion in hopes of improving her symptoms.  Operative note: After induction of anesthesia, patient positioned prone on the Wilson frame and appropriately padded.  Lumbar region prepped and draped sterilely.  Incision made from L2-L5.  Dissection performed bilaterally.  Previously placed pedicle screw hardware at L4-5 was dissected free.  Instrumentation was disassembled.  Fusion inspected.  Fusion found to be solid.  Screws at L5 removed.  Screws at L4 left in place.  Decompressive laminotomies and foraminotomies were then performed bilaterally at L2-3 and L3-4 using Leksell rongeurs Kerrison rongeurs and a high-speed drill to remove the inferior two thirds of the lamina above the entire inferior facet and pars interarticularis of the L2 and L3 levels.  The majority of the superior facet of L3 and L4 bilaterally.  Ligament  flavum and epidural scar were elevated and resected.  Foraminotomies were completed on the course exiting L2, L3, L4 nerve roots.  Epidural venous plexus was coagulated and cut.  Bilateral discectomy was then performed at L2-3 and L3-4.  Interspaces then prepared for interbody fusion.  The interspaces sequentially distracted.  Distractor was left in the patient's right side.  The space was cleaned of all soft tissue.  Medtronic 9 mm extra lordotic expandable cages packed with locally harvested autograft were then packed into place at both L2-3 and L3-4 on the right side.  Each cage was recessed from the posterior margin and expanded to its full extent.  Distractor removed patient's right side.  Interspace and once again cleaned of all soft tissue.  Morselized autograft packed into the interspace.  Cages were then placed on the left side as well.  Each cage was recessed and then expanded to its full extent.  Pedicles of L2 and L3 were then identified using surface landmarks and intraoperative fluoroscopy.  Superficial bone overlying the pedicle was then removed using a high-speed drill.  Each pedicle was then probed using a pedicle awl H protocol track was then tapped.  Each pedicle ultrasonic was found to be solid within bone.  5.75 mm x 45 mm radius bur and screws from [employer] replace bilaterally at L2 and L3.  Final images reveal good position of the cages and the pedicle screws at the proper operative level with improved alignment of the spine.  Wound was then irrigated with saline solution.  The transverse processes of L2-L3 and L4 were decorticated.  Morcellized autograft was packed posterior laterally.  Short segment titanium rod was then placed at the screws at L2-L3 and L4.  Locking caps placed over the screws.  Locking caps then engaged with a  concert and a mild compression.  Gelfoam was placed topically for hemostasis then removed.  Vancomycin powder placed in the deep wound space.  Wounds and  closed in layers with Vicryl sutures.  Steri-Strips and sterile dressing were applied.  No apparent complications.  Patient tolerated the procedure well and she returns to the recovery room postop.

## 2017-02-24 NOTE — Anesthesia Preprocedure Evaluation (Signed)
Anesthesia Evaluation  Patient identified by MRN, date of birth, ID band Patient awake    Reviewed: Allergy & Precautions, H&P , NPO status , Patient's Chart, lab work & pertinent test results  Airway Mallampati: II   Neck ROM: full    Dental   Pulmonary shortness of breath, former smoker,    breath sounds clear to auscultation       Cardiovascular hypertension,  Rhythm:regular Rate:Normal     Neuro/Psych  Headaches, PSYCHIATRIC DISORDERS Anxiety Depression    GI/Hepatic GERD  ,  Endo/Other  Hypothyroidism   Renal/GU      Musculoskeletal  (+) Arthritis ,   Abdominal   Peds  Hematology   Anesthesia Other Findings   Reproductive/Obstetrics                             Anesthesia Physical Anesthesia Plan  ASA: III  Anesthesia Plan: General   Post-op Pain Management:    Induction: Intravenous  PONV Risk Score and Plan: 3 and Ondansetron, Dexamethasone, Midazolam and Treatment may vary due to age or medical condition  Airway Management Planned: Oral ETT  Additional Equipment:   Intra-op Plan:   Post-operative Plan: Extubation in OR  Informed Consent: I have reviewed the patients History and Physical, chart, labs and discussed the procedure including the risks, benefits and alternatives for the proposed anesthesia with the patient or authorized representative who has indicated his/her understanding and acceptance.     Plan Discussed with: CRNA, Anesthesiologist and Surgeon  Anesthesia Plan Comments:         Anesthesia Quick Evaluation

## 2017-02-24 NOTE — H&P (Signed)
[name] is an 66 y.o. female.   Chief Complaint: Back pain HPI: Patient is a 66 year old female with severe back pain. She status post previousL4-5 fusion 2 by another physician. Patient with progressive back and bilateral lower extremitysymptoms failing conservative management. Workup demonstrates evidence of marked adjacent level degeneration with disc space collapse, retrolisthesis, and degenerative scoliosis at L2-3 and L3-4 with significant stenosis.Patient presents now for decompression and fusion at L2-3 and L3-4.  Past Medical History:  Diagnosis Date  . Anxiety   . Chronic pain   . Depression   . Dyspnea    with  URI  . GERD (gastroesophageal reflux disease)   . Headache(784.0)    hx. migraines  . Hypertension   . Hypothyroidism   . Osteoarthritis   . Recurrent pneumonia   . Urinary incontinence     Past Surgical History:  Procedure Laterality Date  . ACHILLES TENDON REPAIR    . HERNIA REPAIR    . LUMBAR FUSION    . LUMBAR LAMINECTOMY    . nerve stimulator insertion and subsequent removal    . TUBAL LIGATION      Family History  Problem Relation Age of Onset  . Breast cancer Mother   . Heart disease Father    Social History:  reports that she quit smoking about 12 years ago. Her smoking use included Cigarettes. She has a 15.00 pack-year smoking history. She has never used smokeless tobacco. She reports that she does not drink alcohol or use drugs.  Allergies:  Allergies  Allergen Reactions  . Fentanyl Diarrhea  . Oxycontin [Oxycodone Hcl]     Hallucinations , can take percocet with no issues     Medications Prior to Admission  Medication Sig Dispense Refill  . acetaminophen (TYLENOL) 500 MG tablet Take 1,000 mg by mouth 5 (five) times daily.    [name] Kitchen. ALPRAZolam (XANAX) 0.5 MG tablet Take 0.25-0.5 mg by mouth 3 (three) times daily. Take 0.25 mg in the morning and the afternoon and 0.5 mg at night    . amitriptyline (ELAVIL) 50 MG tablet Take 50 mg by  mouth at bedtime.    [name] Kitchen. aspirin 325 MG tablet Take 325 mg by mouth daily.      . baclofen (LIORESAL) 20 MG tablet Take 20 mg by mouth every 8 (eight) hours.     . Buprenorphine HCl-Naloxone HCl (SUBOXONE) 8-2 MG FILM Place 1 Film under the tongue 2 (two) times daily.    . diclofenac (VOLTAREN) 50 MG EC tablet Take 50 mg by mouth 4 (four) times daily.    . fluorometholone (FML) 0.1 % ophthalmic suspension Place 1 drop into both eyes 2 (two) times daily.  6  . FLUoxetine (PROZAC) 20 MG tablet Take 20 mg by mouth at bedtime.    . gabapentin (NEURONTIN) 800 MG tablet Take 800 mg by mouth every 6 (six) hours.    . Guaifenesin 1200 MG TB12 Take 1,200 mg by mouth 2 (two) times daily.    [name] Kitchen. levothyroxine (SYNTHROID, LEVOTHROID) 75 MCG tablet Take 75 mcg by mouth daily before breakfast.    . loperamide (IMODIUM A-D) 2 MG tablet Take 2 mg by mouth as needed for diarrhea or loose stools.    . mirtazapine (REMERON) 45 MG tablet Take 45 mg by mouth at bedtime.      . misoprostol (CYTOTEC) 200 MCG tablet Take 200 mcg by mouth 4 (four) times daily.    . nebivolol (BYSTOLIC) 2.5 MG tablet Take 2.5 mg  by mouth daily.      . roflumilast (DALIRESP) 500 MCG TABS tablet Take 500 mcg by mouth daily.    . solifenacin (VESICARE) 5 MG tablet Take 5 mg by mouth daily.     . traMADol (ULTRAM) 50 MG tablet Take 50-100 mg by mouth every 6 (six) hours as needed for moderate pain.     [name] Kitchen trimethoprim (TRIMPEX) 100 MG tablet Take 100 mg by mouth at bedtime.    [name] Kitchen albuterol (PROVENTIL HFA;VENTOLIN HFA) 108 (90 Base) MCG/ACT inhaler Inhale 1-2 puffs into the lungs every 6 (six) hours as needed for wheezing or shortness of breath.      Results for orders placed or performed during the hospital encounter of 02/24/17 (from the past 48 hour(s))  Type and screen [facility]     Status: None   Collection Time: 02/24/17 10:23 AM  Result Value Ref Range   ABO/RH(D) B POS    Antibody Screen POS    Sample Expiration  02/27/2017    No results found.  Pertinent items noted in HPI and remainder of comprehensive ROS otherwise negative.  Blood pressure (!) 148/99, pulse 64, resp. rate 18, height 5\' 4"  (1.626 m), weight 58.8 kg (129 lb 9 oz), SpO2 97 %.  Patient is awake and alert. She is oriented and appropriate. Her speech is fluent. Her judgment and insight are intact. Cranial nerve function normal bilaterally. Motor examination normal bilaterally. Sensory examination nonfocal. Reflexes hypoactive in both lower extremities. Gait antalgic. Posture mildly flexed. Examination head ears eyes nose and throat is unremarkable. Chest and abdomen are benign. Extremities are free from injury deformity. Assessment/Plan L2-3, L3-4 adjacent level degenerative disc disease with Degenerative spondylolisthesis and scoliosis with stenosis. Plan bilateral L2-3, L3 for decompression and fusion with interbody cages and locally harvested autograft coupled with posterior lateral arthrodesis utilizing segmental pedicle screw fixation and local autograft. Risks and benefits been explained. Patient wishes to proceed. [name] A 02/24/2017, 11:50 AM

## 2017-02-25 MED ORDER — DOCUSATE SODIUM 100 MG PO CAPS
100.0000 mg | ORAL_CAPSULE | Freq: Two times a day (BID) | ORAL | Status: DC
  Administered 2017-02-25: 100 mg via ORAL
  Filled 2017-02-25: qty 1

## 2017-02-25 MED ORDER — POLYETHYLENE GLYCOL 3350 17 G PO PACK
17.0000 g | PACK | Freq: Every day | ORAL | Status: DC | PRN
Start: 2017-02-25 — End: 2017-02-26

## 2017-02-25 MED ORDER — OXYCODONE-ACETAMINOPHEN 5-325 MG PO TABS
1.0000 | ORAL_TABLET | ORAL | Status: DC | PRN
  Administered 2017-02-25 – 2017-02-26 (×6): 2 via ORAL
  Filled 2017-02-25 (×7): qty 2

## 2017-02-25 MED ORDER — SENNA 8.6 MG PO TABS
1.0000 | ORAL_TABLET | Freq: Every day | ORAL | Status: DC
  Administered 2017-02-25: 8.6 mg via ORAL
  Filled 2017-02-25: qty 1

## 2017-02-25 MED FILL — Heparin Sodium (Porcine) Inj 1000 Unit/ML: INTRAMUSCULAR | Qty: 30 | Status: AC

## 2017-02-25 MED FILL — Sodium Chloride IV Soln 0.9%: INTRAVENOUS | Qty: 1000 | Status: AC

## 2017-02-25 NOTE — Care Management Note (Signed)
Case Management Note  Patient Details  Name: [name] MRN: [id number] Date of Birth: 08-Sep-1950  Subjective/Objective:    Pt is s/p PLIF L2-3, L4-5                Action/Plan:  PTA from home independent - god daughter will be support person at discharge, pt already has a walker in the home .  Pt state she pays a friend to do light cleaning around the home and will arrange to include light cooking with this resource at discharge.  CM gave pt choice for recommended HH [facility] informed of tentative referral.  CM requested HH orders.   Expected Discharge Date:                  Expected Discharge Plan:  Home w Home Health Services  In-House Referral:     Discharge planning Services  CM Consult  Post Acute Care Choice:    Choice offered to:  Patient  DME Arranged:    DME Agency:     HH Arranged:  PT HH Agency:  [facility]  Status of Service:  In process, will continue to follow  If discussed at Long Length of Stay Meetings, dates discussed:    Additional Comments:  Cherylann ParrClaxton, Katina Remick S, RN 02/25/2017, 9:21 AM

## 2017-02-25 NOTE — Progress Notes (Signed)
Orthopedic Tech Progress Note Patient Details:  [name] [birth year] [id number]  Patient ID: [name], female   DOB: [birth year], 66 y.o.   MRN: [id number]   Saul FordyceJennifer C Fay Swider 02/25/2017, 11:48 [facility].

## 2017-02-25 NOTE — Evaluation (Signed)
Physical Therapy Evaluation Patient Details Name: [name] MRN: [id number] DOB: 1951/03/25 Today's Date: 02/25/2017   History of Present Illness  Pt is a 66yo female admitted for PLIF L2-3, L4-5.  Pt with history of chronic pain seen in pain clinic and previous back surgies. PMH: anxiety.  Clinical Impression  Patient is s/p above surgery resulting in the deficits listed below (see PT Problem List). Pt very anxious and jittery in 7/10 pain. Focused on optimal posture and slowing down ambulation pace. Patient will benefit from skilled PT to increase their independence and safety with mobility (while adhering to their precautions) to allow discharge to the venue listed below.     Follow Up Recommendations Home health PT;Supervision - Intermittent    Equipment Recommendations  None recommended by PT    Recommendations for Other Services       Precautions / Restrictions Precautions Precautions: Back Precaution Booklet Issued: Yes (comment) Precaution Comments: pt able to recall at end of session Required Braces or Orthoses: Spinal Brace Spinal Brace: Lumbar corset;Applied in sitting position Restrictions Weight Bearing Restrictions: No      Mobility  Bed Mobility Overal bed mobility: Modified Independent             General bed mobility comments: pt able to sit EOB, lay to sidelying and then roll into bed, pt able to log roll to L and R. v/c's to decrease speed  Transfers Overall transfer level: Modified independent Equipment used: None             General transfer comment: pt impulsively quick, v/c's to slow down, pt with trunk flexion however pt with excessive trunk flexion PTA  Ambulation/Gait Ambulation/Gait assistance: Supervision Ambulation Distance (Feet): 160 Feet Assistive device: None Gait Pattern/deviations: Step-through pattern;Decreased stance time - right;Decreased step length - left;Staggering left;Staggering right;Antalgic Gait  velocity: impulsively past, pt able to slow down with max v/c's   General Gait Details: pt very quick as pt walked fast before due to excessive trunk flexion. focused on contracting abdominal muscles to support back and obtain upright posture and slowing down pace and dec step length for optimal gait pattern and to minimize antalgia and trunk flexion  Stairs Stairs: Yes Stairs assistance: Min guard Stair Management: One rail Right;Alternating pattern Number of Stairs: 10 General stair comments: pt with good posture and appropriate cadence. pt with erect posture  Wheelchair Mobility    Modified Rankin (Stroke Patients Only)       Balance Overall balance assessment: Needs assistance Sitting-balance support: Feet supported;No upper extremity supported Sitting balance-Leahy Scale: Good Sitting balance - Comments: pt able to don brace without difficulty or LOB   Standing balance support: No upper extremity supported Standing balance-Leahy Scale: Fair                               Pertinent Vitals/Pain Pain Assessment: 0-10 Pain Score: 7  Pain Location: back Pain Descriptors / Indicators: Aching;Radiating;Stabbing;Operative site guarding (R hip) Pain Intervention(s): Monitored during session    Home Living Family/patient expects to be discharged to:: Private residence Living Arrangements: Alone Available Help at Discharge: Family;Available PRN/intermittently (goddaughter and friend to help out) Type of Home: House Home Access: Stairs to enter Entrance Stairs-Rails: Right;Left (can't reach both) Entrance Stairs-Number of Steps: 2 Home Layout: One level Home Equipment: Walker - 2 wheels      Prior Function Level of Independence: Independent with assistive device(s)  Comments: used RW when ambulation due to increased trunk flexion     Hand Dominance   Dominant Hand: Right    Extremity/Trunk Assessment   Upper Extremity Assessment Upper  Extremity Assessment: Generalized weakness    Lower Extremity Assessment Lower Extremity Assessment: Generalized weakness    Cervical / Trunk Assessment Cervical / Trunk Assessment: Other exceptions Cervical / Trunk Exceptions: recent back surgery  Communication   Communication: No difficulties  Cognition Arousal/Alertness: Awake/alert Behavior During Therapy: Anxious Overall Cognitive Status: No family/caregiver present to determine baseline cognitive functioning                                 General Comments: pt very anxious and jittery, pt reports i'm like this when i'm hurting.       General Comments General comments (skin integrity, edema, etc.): dressing intact    Exercises Other Exercises Other Exercises: worked on isometric abdominal and pelvic tilts in hooklying position in bed   Assessment/Plan    PT Assessment Patient needs continued PT services  PT Problem List Decreased strength;Decreased balance;Decreased activity tolerance;Decreased mobility;Decreased safety awareness;Pain       PT Treatment Interventions DME instruction;Gait training;Stair training;Functional mobility training;Therapeutic activities;Therapeutic exercise;Balance training;Neuromuscular re-education    PT Goals (Current goals can be found in the Care Plan section)  Acute Rehab PT Goals Patient Stated Goal: "walk straight" PT Goal Formulation: With patient Time For Goal Achievement: 03/04/17 Potential to Achieve Goals: Good    Frequency Min 5X/week   Barriers to discharge Decreased caregiver support lives alone    Co-evaluation               AM-PAC PT "6 Clicks" Daily Activity  Outcome Measure Difficulty turning over in bed (including adjusting bedclothes, sheets and blankets)?: None Difficulty moving from lying on back to sitting on the side of the bed? : None Difficulty sitting down on and standing up from a chair with arms (e.g., wheelchair, bedside commode,  etc,.)?: None Help needed moving to and from a bed to chair (including a wheelchair)?: None Help needed walking in hospital room?: A Little Help needed climbing 3-5 steps with a railing? : A Little 6 Click Score: 22    End of Session Equipment Utilized During Treatment: Gait belt;Back brace Activity Tolerance: Patient limited by pain;Patient limited by fatigue Patient left: in bed;with call bell/phone within reach Nurse Communication: Mobility status PT Visit Diagnosis: Pain;Difficulty in walking, not elsewhere classified (R26.2) Pain - Right/Left: Right Pain - part of body: Hip (back)    Time: [id number] PT Time Calculation (min) (ACUTE ONLY): 23 min   Charges:   PT Evaluation $PT Eval Moderate Complexity: 1 Mod PT Treatments $Gait Training: 8-22 mins   PT G Codes:        Lewis ShockAshly Azula Zappia, PT, DPT Pager #: [phone] Office #: [phone]   Christopher Hink M Wyndham Santilli 02/25/2017, 8:47 AM

## 2017-02-25 NOTE — Progress Notes (Signed)
Postop day 1.  Patient with moderately severe incisional pain and some radiating pain along her right anterior lateral thigh.  No weakness.  No sensory loss.  Patient has been able to ambulate.  Afebrile.  Vitals are stable.  Awake and alert.  Oriented and appropriate.  Motor and sensory function stable.  Wound clean and dry.  Abdomen soft.  Progressing reasonably well following revision lumbar decompression and fusion.  Continue efforts at mobilization.  Possible discharge tomorrow.

## 2017-02-25 NOTE — Evaluation (Addendum)
Occupational Therapy Evaluation Patient Details Name: [name] MRN: [id number] DOB: 12/12/1950 Today's Date: 02/25/2017    History of Present Illness Pt is a 66yo female admitted for PLIF L2-3, L4-5.  Pt with history of chronic pain seen in pain clinic and previous back surgies. PMH: anxiety.   Clinical Impression   PTA, pt was living alone and performing her ADLs and IADLs. Pt currently requiring Min Guard A for ADLs due to poor posture and decreased functional performance due to pain. Pt performing LB dressing using AE and toilet transfer with Min Guard A for safety. Pt would benefit from further acute OT to facilitate safe dc. Recommend dc with HHOT to optimize safety and independence with ADLs and functional mobility.     Follow Up Recommendations  Home health OT;Supervision/Assistance - 24 hour    Equipment Recommendations  None recommended by OT (Declines 3N1)    Recommendations for Other Services       Precautions / Restrictions Precautions Precautions: Back Precaution Booklet Issued: No Precaution Comments: pt able to recall at end of session Required Braces or Orthoses: Spinal Brace Spinal Brace: Lumbar corset;Applied in sitting position Restrictions Weight Bearing Restrictions: No      Mobility Bed Mobility Overal bed mobility: Modified Independent             General bed mobility comments: Educated pt on log roll. Pt demosntrating understanding  Transfers Overall transfer level: Needs assistance Equipment used: None Transfers: Sit to/from Stand Sit to Stand: Min guard         General transfer comment: pt impulsively quick, v/c's to slow down, pt with trunk flexion however pt with excessive trunk flexion PTA    Balance Overall balance assessment: Needs assistance Sitting-balance support: Feet supported;No upper extremity supported Sitting balance-Leahy Scale: Good Sitting balance - Comments: pt able to don brace without difficulty or  LOB   Standing balance support: No upper extremity supported Standing balance-Leahy Scale: Fair                             ADL either performed or assessed with clinical judgement   ADL Overall ADL's : Needs assistance/impaired Eating/Feeding: Set up;Sitting   Grooming: Min guard;Standing;Oral care   Upper Body Bathing: Min guard;Cueing for sequencing   Lower Body Bathing: Sit to/from stand;Minimal assistance   Upper Body Dressing : Min guard;Sitting Upper Body Dressing Details (indicate cue type and reason): Pt donned brace with MIn Guard A and VCs for adherance to precautions Lower Body Dressing: Min guard;Sit to/from stand;With adaptive equipment;Cueing for sequencing Lower Body Dressing Details (indicate cue type and reason): Pt donned underwear with AE and MIn Guard for safety in standing Toilet Transfer: Solicitor;Ambulation     Toileting - Clothing Manipulation Details (indicate cue type and reason): Discussed safe toilet hygiene with back precautions   Tub/Shower Transfer Details (indicate cue type and reason): Will need to adress tub/shower transfer and safe technique Functional mobility during ADLs: Min guard General ADL Comments: Pt with decreased fucntional performance and requiring MIn guard throughout session for safety due to poor posture and bouncing of knees. Pt demonstrating understanding of toilet t/f and LB dressing. Pt will need further education on tub transfer (pt rpeorting walk-in shower but described tub with a step)     Vision Baseline Vision/History: Wears glasses Wears Glasses: At all times Patient Visual Report: No change from baseline       Perception  Praxis      Pertinent Vitals/Pain Pain Assessment: 0-10 Pain Score: 8  Pain Location: back Pain Descriptors / Indicators: Aching;Radiating;Stabbing;Operative site guarding (R hip) Pain Intervention(s): Monitored during session;Premedicated before  session;Repositioned     Hand Dominance Right   Extremity/Trunk Assessment Upper Extremity Assessment Upper Extremity Assessment: Generalized weakness   Lower Extremity Assessment Lower Extremity Assessment: Generalized weakness   Cervical / Trunk Assessment Cervical / Trunk Assessment: Other exceptions Cervical / Trunk Exceptions: recent back surgery (Postueral lean forward and to R)   Communication Communication Communication: No difficulties   Cognition Arousal/Alertness: Awake/alert Behavior During Therapy: Anxious Overall Cognitive Status: No family/caregiver present to determine baseline cognitive functioning                                 General Comments: pt very anxious and jittery, pt reports i'm like this when i'm hurting.    General Comments  R hand swollen    Exercises    Shoulder Instructions      Home Living Family/patient expects to be discharged to:: Private residence Living Arrangements: Alone Available Help at Discharge: Family;Available PRN/intermittently (goddaughter and friend to help out) Type of Home: House Home Access: Stairs to enter [employer] of Steps: 2 Entrance Stairs-Rails: Right;Left (can't reach both) Home Layout: One level     Bathroom Shower/Tub: Chief Strategy Officer: Standard     Home Equipment: Environmental consultant - 2 wheels   Additional Comments: Pt reporting walk-in shower. However, with further questioning she describes a tub shower set up      Prior Functioning/Environment Level of Independence: Independent with assistive device(s)        Comments: used RW when ambulation due to increased trunk flexion        OT Problem List: Decreased range of motion;Decreased activity tolerance;Impaired balance (sitting and/or standing);Decreased safety awareness;Decreased knowledge of use of DME or AE;Decreased knowledge of precautions;Pain      OT Treatment/Interventions: Self-care/ADL  training;Therapeutic exercise;Energy conservation;DME and/or AE instruction;Therapeutic activities;Patient/family education    OT Goals(Current goals can be found in the care plan section) Acute Rehab OT Goals Patient Stated Goal: Get pain under control OT Goal Formulation: With patient Time For Goal Achievement: 03/11/17 Potential to Achieve Goals: Good ADL Goals Pt Will Perform Lower Body Dressing: with modified independence;sit to/from stand;with adaptive equipment Pt Will Transfer to Toilet: with modified independence;ambulating;regular height toilet Pt Will Perform Toileting - Clothing Manipulation and hygiene: with modified independence;sit to/from stand Pt Will Perform Tub/Shower Transfer: Tub transfer;ambulating;with modified independence  OT Frequency: Min 2X/week   Barriers to D/C: Decreased caregiver support  Lives alone       Co-evaluation              AM-PAC PT "6 Clicks" Daily Activity     Outcome Measure Help from another person eating meals?: None Help from another person taking care of personal grooming?: A Little Help from another person toileting, which includes using toliet, bedpan, or urinal?: A Little Help from another person bathing (including washing, rinsing, drying)?: A Little Help from another person to put on and taking off regular upper body clothing?: None Help from another person to put on and taking off regular lower body clothing?: A Little 6 Click Score: 20   End of Session Equipment Utilized During Treatment: Gait belt Nurse Communication: Mobility status  Activity Tolerance: Patient tolerated treatment well;Patient limited by pain Patient left: in chair;with  call bell/phone within reach  OT Visit Diagnosis: Unsteadiness on feet (R26.81);Other abnormalities of gait and mobility (R26.89);Muscle weakness (generalized) (M62.81);Pain Pain - Right/Left:  (Back) Pain - part of body:  (Back)                Time: 4098-11910822-0839 OT Time Calculation  (min): 17 min Charges:  OT General Charges $OT Visit: 1 Visit OT Evaluation $OT Eval Moderate Complexity: 1 Mod G-Codes:     Ruthmary Occhipinti MSOT, OTR/L Acute Rehab Pager: [phone] Office: [phone]  Theodoro GristCharis M Kalany Diekmann 02/25/2017, 10:03 AM

## 2017-02-26 LAB — TYPE AND SCREEN
ABO/RH(D): B POS
Antibody Screen: POSITIVE
Donor AG Type: NEGATIVE
Donor AG Type: NEGATIVE
Unit division: 0
Unit division: 0

## 2017-02-26 LAB — BPAM RBC
Blood Product Expiration Date: 201811082359
Blood Product Expiration Date: 201811082359
ISSUE DATE / TIME: 201810211825
Unit Type and Rh: 7300
Unit Type and Rh: 7300

## 2017-02-26 MED ORDER — OXYCODONE-ACETAMINOPHEN 5-325 MG PO TABS: 1.0000 | ORAL_TABLET | ORAL | 0 refills | Status: DC | PRN

## 2017-02-26 MED ORDER — DIAZEPAM 5 MG PO TABS: 5.0000 mg | ORAL_TABLET | Freq: Four times a day (QID) | ORAL | 0 refills | Status: DC | PRN

## 2017-02-26 NOTE — Progress Notes (Signed)
Physical Therapy Treatment Patient Details Name: [name] MRN: [id number] DOB: 03/05/1951 Today's Date: 02/26/2017    History of Present Illness Pt is a 66 y/o female admitted for PLIF L2-3, L4-5.  Pt with history of chronic pain seen in pain clinic and previous back surgies. PMH: anxiety.    PT Comments    Pt appeared anxious and impulsive throughout treatment session. Refused stair training and education was minimal due to pt talking over therapist. States she is ready to go home. Therapist attempted to educate family who was present to take pt home at d/c. Confirmed that HHPT has been ordered. Will continue to follow and progress as able per POC.    Follow Up Recommendations  Home health PT;Supervision - Intermittent     Equipment Recommendations  None recommended by PT    Recommendations for Other Services       Precautions / Restrictions Precautions Precautions: Back Precaution Booklet Issued: No Precaution Comments: pt able to recall at end of session Required Braces or Orthoses: Spinal Brace Spinal Brace: Lumbar corset;Applied in sitting position (Different brace than [place] that was ordered) Restrictions Weight Bearing Restrictions: No    Mobility  Bed Mobility Overal bed mobility: Needs Assistance Bed Mobility: Rolling;Sidelying to Sit Rolling: Supervision Sidelying to sit: Supervision       General bed mobility comments: Pt was received standing in doorway of room.   Transfers Overall transfer level: Needs assistance Equipment used: None Transfers: Sit to/from Stand Sit to Stand: Supervision         General transfer comment: Supervision for safety with VC's. Pt moving quickly and impulsively.   Ambulation/Gait Ambulation/Gait assistance: Supervision Ambulation Distance (Feet): 200 Feet Assistive device: None Gait Pattern/deviations: Step-through pattern;Decreased stance time - right;Decreased step length - left;Staggering left;Staggering  right;Antalgic Gait velocity: impulsively fast, pt able to slow down with max v/c's   General Gait Details: pt very quick as pt walked fast before due to excessive trunk flexion. focused on contracting abdominal muscles to support back and obtain upright posture and slowing down pace and dec step length for optimal gait pattern and to minimize antalgia and trunk flexion   Stairs         General stair comments: Pt refused.   Wheelchair Mobility    Modified Rankin (Stroke Patients Only)       Balance Overall balance assessment: Needs assistance Sitting-balance support: Feet supported;No upper extremity supported Sitting balance-Leahy Scale: Good Sitting balance - Comments: donning brace and clothing at EOB wihtout LOB   Standing balance support: No upper extremity supported Standing balance-Leahy Scale: Fair                              Cognition Arousal/Alertness: Awake/alert Behavior During Therapy: Anxious Overall Cognitive Status: No family/caregiver present to determine baseline cognitive functioning                                 General Comments: Pt very anxious and tremoring throughout session.       Exercises      General Comments        Pertinent Vitals/Pain Pain Assessment: Faces Faces Pain Scale: Hurts little more Pain Location: back Pain Descriptors / Indicators: Aching;Operative site guarding;Sore Pain Intervention(s): Limited activity within patient's tolerance;Monitored during session;Repositioned    Home Living  Prior Function            PT Goals (current goals can now be found in the care plan section) Acute Rehab PT Goals Patient Stated Goal: Get home soon PT Goal Formulation: With patient Time For Goal Achievement: 03/04/17 Potential to Achieve Goals: Good Progress towards PT goals: Progressing toward goals    Frequency    Min 5X/week      PT Plan Current plan remains  appropriate    Co-evaluation              AM-PAC PT "6 Clicks" Daily Activity  Outcome Measure  Difficulty turning over in bed (including adjusting bedclothes, sheets and blankets)?: None Difficulty moving from lying on back to sitting on the side of the bed? : None Difficulty sitting down on and standing up from a chair with arms (e.g., wheelchair, bedside commode, etc,.)?: None Help needed moving to and from a bed to chair (including a wheelchair)?: None Help needed walking in hospital room?: A Little Help needed climbing 3-5 steps with a railing? : A Little 6 Click Score: 22    End of Session Equipment Utilized During Treatment: Gait belt;Back brace Activity Tolerance: Patient limited by pain;Patient limited by fatigue Patient left: in bed;with call bell/phone within reach Nurse Communication: Mobility status PT Visit Diagnosis: Pain;Difficulty in walking, not elsewhere classified (R26.2) Pain - Right/Left: Right Pain - part of body: Hip (back)     Time: 6045-4098 PT Time Calculation (min) (ACUTE ONLY): 12 min  Charges:  $Gait Training: 8-22 mins                    G Codes:       Conni Slipper, PT, DPT Acute Rehabilitation Services Pager: [phone]    Marylynn Pearson 02/26/2017, 12:30 PM

## 2017-02-26 NOTE — Progress Notes (Signed)
Pt and family given D/C instructions with Rx's, verbal understanding was provided. Pt's IV was removed prior to D/C. Pt's incision is clean and dry with no sign of infection. Pt's incision is clean and dry with no sign of infection. Pt D/C'd home via wheelchair @ 1110 per MD order. Pt is stable @ D/C and has no other needs at this time. Rema FendtAshley Jolly Bleicher, RN

## 2017-02-26 NOTE — Discharge Instructions (Signed)

## 2017-02-26 NOTE — Progress Notes (Signed)
Occupational Therapy Treatment Patient Details Name: [name] MRN: [id number] DOB: Sep 08, 1950 Today's Date: 02/26/2017    History of present illness Pt is a 66yo female admitted for PLIF L2-3, L4-5.  Pt with history of chronic pain seen in pain clinic and previous back surgies. PMH: anxiety.   OT comments  Pt is demonstrating progress toward OT goals. She was able to complete ambulating toilet transfers and LB ADL with close supervision and VC's for adherence to back precautions. Pt remains adamantly opposed to use of 3-in-1 or shower seat; however, continue to feel she is at risk of falling in shower if standing due to her impulsivity and instability. She required min assist for tub transfer this session. Pt educated concerning safety post-acute D/C as well as compensatory ADL strategies and relaxation techniques as she is very anxious. Note plan to D/C this date. Will continue to follow while admitted.    Follow Up Recommendations  Home health OT;Supervision/Assistance - 24 hour    Equipment Recommendations  None recommended by OT (declines 3-in-1)    Recommendations for Other Services      Precautions / Restrictions Precautions Precautions: Back Precaution Booklet Issued: No Precaution Comments: pt able to recall at end of session Required Braces or Orthoses: Spinal Brace Spinal Brace: Lumbar corset;Applied in sitting position       Mobility Bed Mobility Overal bed mobility: Needs Assistance Bed Mobility: Rolling;Sidelying to Sit Rolling: Supervision Sidelying to sit: Supervision       General bed mobility comments: Required cues for adhering to back precautions with log roll.   Transfers Overall transfer level: Needs assistance Equipment used: None Transfers: Sit to/from Stand Sit to Stand: Supervision         General transfer comment: Supervision for safety with VC's. Pt moving quickly and impulsively.     Balance Overall balance assessment: Needs  assistance Sitting-balance support: Feet supported;No upper extremity supported Sitting balance-Leahy Scale: Good Sitting balance - Comments: donning brace and clothing at EOB wihtout LOB   Standing balance support: No upper extremity supported Standing balance-Leahy Scale: Fair                             ADL either performed or assessed with clinical judgement   ADL Overall ADL's : Needs assistance/impaired             Lower Body Bathing: Supervison/ safety;Sit to/from stand   Upper Body Dressing : Supervision/safety;Sitting   Lower Body Dressing: Sit to/from stand;Supervision/safety   Toilet Transfer: Supervision/safety;Ambulation;Regular Toilet   Toileting- Architect and Hygiene: Supervision/safety;Sit to/from stand Toileting - Clothing Manipulation Details (indicate cue type and reason): cues for back precautions Tub/ Shower Transfer: Minimal assistance;Ambulation;Tub transfer Tub/Shower Transfer Details (indicate cue type and reason): Adamantly refused to utilize 3-in-1 Functional mobility during ADLs: Supervision/safety General ADL Comments: Pt easily distracted, tremoring throughout session and very emotional. Educated pt on safe tub transfers and she was able to complete with maximal cueing and min physical assist. She refused adamantly to utilize a shower seat but feel is unsafe. Pt unable to maintain static standing positions and moves constantly.      Vision Baseline Vision/History: Wears glasses Wears Glasses: At all times Patient Visual Report: No change from baseline Vision Assessment?: No apparent visual deficits   Perception     Praxis      Cognition Arousal/Alertness: Awake/alert Behavior During Therapy: Anxious Overall Cognitive Status: No family/caregiver present to determine baseline cognitive  functioning                                 General Comments: Pt very anxious and tremoring throughout session. Very  emotional concerning her husband's death in 2011.        Exercises     Shoulder Instructions       General Comments      Pertinent Vitals/ Pain       Pain Assessment: Faces Faces Pain Scale: Hurts little more Pain Location: back Pain Descriptors / Indicators: Aching;Operative site guarding;Sore Pain Intervention(s): Limited activity within patient's tolerance;Monitored during session;Repositioned  Home Living                                          Prior Functioning/Environment              Frequency  Min 2X/week        Progress Toward Goals  OT Goals(current goals can now be found in the care plan section)  Progress towards OT goals: Progressing toward goals  Acute Rehab OT Goals Patient Stated Goal: Get home soon OT Goal Formulation: With patient Time For Goal Achievement: 03/11/17 Potential to Achieve Goals: Good  Plan Discharge plan remains appropriate    Co-evaluation                 AM-PAC PT "6 Clicks" Daily Activity     Outcome Measure   Help from another person eating meals?: None Help from another person taking care of personal grooming?: A Little Help from another person toileting, which includes using toliet, bedpan, or urinal?: A Little Help from another person bathing (including washing, rinsing, drying)?: A Little Help from another person to put on and taking off regular upper body clothing?: None Help from another person to put on and taking off regular lower body clothing?: A Little 6 Click Score: 20    End of Session Equipment Utilized During Treatment: Back brace  OT Visit Diagnosis: Unsteadiness on feet (R26.81);Other abnormalities of gait and mobility (R26.89);Muscle weakness (generalized) (M62.81);Pain Pain - Right/Left:  (back) Pain - part of body:  (back)   Activity Tolerance Patient tolerated treatment well   Patient Left with call bell/phone within reach;in bed (sitting at EOB to eat breakfast)    Nurse Communication Mobility status        Time: 4098-11910812-0837 OT Time Calculation (min): 25 min  Charges: OT General Charges $OT Visit: 1 Visit OT Treatments $Self Care/Home Management : 23-37 mins  Doristine Sectionharity A Sherill Wegener, MS OTR/L  Pager: [phone]    Kathye Cipriani A Gillie Fleites 02/26/2017, 10:53 AM

## 2017-02-26 NOTE — Discharge Summary (Signed)
Physician Discharge Summary  Patient ID: Rolly PancakeRickie A Laneve MRN: [id number] DOB/AGE: Mar 20, 1951 66 y.o.  Admit date: 02/24/2017 Discharge date: 02/26/2017  Admission Diagnoses:  Discharge Diagnoses:  Active Problems:   Degenerative spondylolisthesis   Discharged Condition: good  Hospital Course: Patient admitted to the hospital where she underwent on top of herniated lumbar decompression and fusion at L2-3 and L3-4. Postoperatively doing well. Preoperative back and radicular pain improved. Ambulating with minimal difficulty. Ready for discharge home. ready for discharge home.  Patient admitted to the hospital where she underwent an uncomplicated lumbar decompression and fusion at L2-3 and L3-4.  Postoperatively doing well.  Preoperative back and radicular pain improved.  Ambulating with minimal difficulty.  Consults:   Significant Diagnostic Studies:   Treatments:   Discharge Exam: Blood pressure (!) 125/98, pulse 73, temperature 99.1 F (37.3 C), resp. rate 16, height 5\' 4"  (1.626 m), weight 58.8 kg (129 lb 9 oz), SpO2 93 %. Awake and alert. Oriented and appropriate. Cranial nerve function intact. Motor and sensory function extremities normal. Wound clean and dry. Chest and abdomen benign. Awake and alert.  Oriented and appropriate.  Cranial nerve function intact.  Motor and sensory function extremities normal.  Wound clean and dry.  Chest and abdomen benign.  Disposition:    Allergies as of 02/26/2017      Reactions   Fentanyl Diarrhea   Oxycontin [oxycodone Hcl]    Hallucinations , can take percocet with no issues       Medication List    TAKE these medications   acetaminophen 500 MG tablet Commonly known as:  TYLENOL Take 1,000 mg by mouth 5 (five) times daily.   albuterol 108 (90 Base) MCG/ACT inhaler Commonly known as:  PROVENTIL HFA;VENTOLIN HFA Inhale 1-2 puffs into the lungs every 6 (six) hours as needed for wheezing or shortness of breath.   ALPRAZolam 0.5  MG tablet Commonly known as:  XANAX Take 0.25-0.5 mg by mouth 3 (three) times daily. Take 0.25 mg in the morning and the afternoon and 0.5 mg at night   amitriptyline 50 MG tablet Commonly known as:  ELAVIL Take 50 mg by mouth at bedtime.   aspirin 325 MG tablet Take 325 mg by mouth daily.   baclofen 20 MG tablet Commonly known as:  LIORESAL Take 20 mg by mouth every 8 (eight) hours.   DALIRESP 500 MCG Tabs tablet Generic drug:  roflumilast Take 500 mcg by mouth daily.   diazepam 5 MG tablet Commonly known as:  VALIUM Take 1-2 tablets (5-10 mg total) by mouth every 6 (six) hours as needed for muscle spasms.   diclofenac 50 MG EC tablet Commonly known as:  VOLTAREN Take 50 mg by mouth 4 (four) times daily.   fluorometholone 0.1 % ophthalmic suspension Commonly known as:  FML Place 1 drop into both eyes 2 (two) times daily.   FLUoxetine 20 MG tablet Commonly known as:  PROZAC Take 20 mg by mouth at bedtime.   gabapentin 800 MG tablet Commonly known as:  NEURONTIN Take 800 mg by mouth every 6 (six) hours.   Guaifenesin 1200 MG Tb12 Take 1,200 mg by mouth 2 (two) times daily.   levothyroxine 75 MCG tablet Commonly known as:  SYNTHROID, LEVOTHROID Take 75 mcg by mouth daily before breakfast.   loperamide 2 MG tablet Commonly known as:  IMODIUM A-D Take 2 mg by mouth as needed for diarrhea or loose stools.   mirtazapine 45 MG tablet Commonly known as:  REMERON Take 45  mg by mouth at bedtime.   misoprostol 200 MCG tablet Commonly known as:  CYTOTEC Take 200 mcg by mouth 4 (four) times daily.   nebivolol 2.5 MG tablet Commonly known as:  BYSTOLIC Take 2.5 mg by mouth daily.   oxyCODONE-acetaminophen 5-325 MG tablet Commonly known as:  PERCOCET/ROXICET Take 1-2 tablets by mouth every 4 (four) hours as needed for moderate pain.   solifenacin 5 MG tablet Commonly known as:  VESICARE Take 5 mg by mouth daily.   SUBOXONE 8-2 MG Film Generic drug:   Buprenorphine HCl-Naloxone HCl Place 1 Film under the tongue 2 (two) times daily.   traMADol 50 MG tablet Commonly known as:  ULTRAM Take 50-100 mg by mouth every 6 (six) hours as needed for moderate pain.   trimethoprim 100 MG tablet Commonly known as:  TRIMPEX Take 100 mg by mouth at bedtime.            Durable Medical Equipment        Start     Ordered   02/24/17 1826  DME Walker rolling  Once    Question:  Patient needs a walker to treat with the following condition  Answer:  Degenerative spondylolisthesis   02/24/17 1825   02/24/17 1826  DME 3 n 1  Once     02/24/17 1825       Signed: Madaleine Simmon A 02/26/2017, 9:53 AM

## 2017-02-28 ENCOUNTER — Inpatient Hospital Stay (HOSPITAL_COMMUNITY)
Admission: AD | Admit: 2017-02-28 | Discharge: 2017-03-04 | DRG: 193 | Disposition: A | Discharge status: Home Health | Payer: Medicare Other | Source: Other Acute Inpatient Hospital | Attending: Internal Medicine | Admitting: Internal Medicine

## 2017-02-28 ENCOUNTER — Encounter (HOSPITAL_COMMUNITY): Payer: Self-pay | Admitting: General Practice

## 2017-02-28 DIAGNOSIS — M545 Low back pain: Secondary | ICD-10-CM | POA: Diagnosis not present

## 2017-02-28 DIAGNOSIS — J9601 Acute respiratory failure with hypoxia: Secondary | ICD-10-CM

## 2017-02-28 DIAGNOSIS — K219 Gastro-esophageal reflux disease without esophagitis: Secondary | ICD-10-CM | POA: Diagnosis present

## 2017-02-28 DIAGNOSIS — I10 Essential (primary) hypertension: Secondary | ICD-10-CM | POA: Diagnosis present

## 2017-02-28 DIAGNOSIS — Y95 Nosocomial condition: Secondary | ICD-10-CM | POA: Diagnosis not present

## 2017-02-28 DIAGNOSIS — Z79899 Other long term (current) drug therapy: Secondary | ICD-10-CM | POA: Diagnosis not present

## 2017-02-28 DIAGNOSIS — F419 Anxiety disorder, unspecified: Secondary | ICD-10-CM | POA: Diagnosis not present

## 2017-02-28 DIAGNOSIS — Z79891 Long term (current) use of opiate analgesic: Secondary | ICD-10-CM | POA: Diagnosis not present

## 2017-02-28 DIAGNOSIS — J189 Pneumonia, unspecified organism: Secondary | ICD-10-CM | POA: Diagnosis not present

## 2017-02-28 DIAGNOSIS — K5903 Drug induced constipation: Secondary | ICD-10-CM | POA: Diagnosis present

## 2017-02-28 DIAGNOSIS — R6520 Severe sepsis without septic shock: Secondary | ICD-10-CM | POA: Diagnosis not present

## 2017-02-28 DIAGNOSIS — E873 Alkalosis: Secondary | ICD-10-CM | POA: Diagnosis not present

## 2017-02-28 DIAGNOSIS — D62 Acute posthemorrhagic anemia: Secondary | ICD-10-CM | POA: Diagnosis not present

## 2017-02-28 DIAGNOSIS — I959 Hypotension, unspecified: Secondary | ICD-10-CM | POA: Diagnosis not present

## 2017-02-28 DIAGNOSIS — F329 Major depressive disorder, single episode, unspecified: Secondary | ICD-10-CM | POA: Diagnosis not present

## 2017-02-28 DIAGNOSIS — Z87891 Personal history of nicotine dependence: Secondary | ICD-10-CM

## 2017-02-28 DIAGNOSIS — J9811 Atelectasis: Secondary | ICD-10-CM | POA: Diagnosis present

## 2017-02-28 DIAGNOSIS — R918 Other nonspecific abnormal finding of lung field: Secondary | ICD-10-CM | POA: Diagnosis not present

## 2017-02-28 DIAGNOSIS — G8929 Other chronic pain: Secondary | ICD-10-CM | POA: Diagnosis present

## 2017-02-28 DIAGNOSIS — T402X5A Adverse effect of other opioids, initial encounter: Secondary | ICD-10-CM | POA: Diagnosis present

## 2017-02-28 DIAGNOSIS — E876 Hypokalemia: Secondary | ICD-10-CM | POA: Diagnosis present

## 2017-02-28 DIAGNOSIS — Z79890 Hormone replacement therapy (postmenopausal): Secondary | ICD-10-CM | POA: Diagnosis not present

## 2017-02-28 DIAGNOSIS — Y92009 Unspecified place in unspecified non-institutional (private) residence as the place of occurrence of the external cause: Secondary | ICD-10-CM

## 2017-02-28 DIAGNOSIS — E039 Hypothyroidism, unspecified: Secondary | ICD-10-CM | POA: Diagnosis not present

## 2017-02-28 DIAGNOSIS — J869 Pyothorax without fistula: Secondary | ICD-10-CM | POA: Diagnosis present

## 2017-02-28 DIAGNOSIS — M1990 Unspecified osteoarthritis, unspecified site: Secondary | ICD-10-CM | POA: Diagnosis present

## 2017-02-28 DIAGNOSIS — R531 Weakness: Secondary | ICD-10-CM

## 2017-02-28 DIAGNOSIS — R0602 Shortness of breath: Secondary | ICD-10-CM | POA: Diagnosis not present

## 2017-02-28 DIAGNOSIS — J810 Acute pulmonary edema: Secondary | ICD-10-CM | POA: Diagnosis not present

## 2017-02-28 DIAGNOSIS — Z885 Allergy status to narcotic agent status: Secondary | ICD-10-CM

## 2017-02-28 DIAGNOSIS — Z8701 Personal history of pneumonia (recurrent): Secondary | ICD-10-CM

## 2017-02-28 DIAGNOSIS — A419 Sepsis, unspecified organism: Secondary | ICD-10-CM | POA: Diagnosis not present

## 2017-02-28 LAB — CBC
HCT: 19.3 % — ABNORMAL LOW (ref 36.0–46.0)
Hemoglobin: 6.6 g/dL — CL (ref 12.0–15.0)
MCH: 32.2 pg (ref 26.0–34.0)
MCHC: 34.2 g/dL (ref 30.0–36.0)
MCV: 94.1 fL (ref 78.0–100.0)
Platelets: 157 10*3/uL (ref 150–400)
RBC: 2.05 MIL/uL — AB (ref 3.87–5.11)
RDW: 12.8 % (ref 11.5–15.5)
WBC: 2.2 10*3/uL — AB (ref 4.0–10.5)

## 2017-02-28 LAB — URINALYSIS, ROUTINE W REFLEX MICROSCOPIC
BILIRUBIN URINE: NEGATIVE
Glucose, UA: NEGATIVE mg/dL
Hgb urine dipstick: NEGATIVE
Ketones, ur: NEGATIVE mg/dL
LEUKOCYTES UA: NEGATIVE
NITRITE: NEGATIVE
Protein, ur: NEGATIVE mg/dL
SPECIFIC GRAVITY, URINE: 1.009 (ref 1.005–1.030)
pH: 6 (ref 5.0–8.0)

## 2017-02-28 LAB — HEMOGLOBIN AND HEMATOCRIT, BLOOD
HEMATOCRIT: 22 % — AB (ref 36.0–46.0)
Hemoglobin: 7.7 g/dL — ABNORMAL LOW (ref 12.0–15.0)

## 2017-02-28 LAB — PROCALCITONIN: PROCALCITONIN: 12.73 ng/mL

## 2017-02-28 LAB — BASIC METABOLIC PANEL
ANION GAP: 6 (ref 5–15)
BUN: 7 mg/dL (ref 6–20)
CHLORIDE: 113 mmol/L — AB (ref 101–111)
CO2: 17 mmol/L — AB (ref 22–32)
Calcium: 7.5 mg/dL — ABNORMAL LOW (ref 8.9–10.3)
Creatinine, Ser: 0.81 mg/dL (ref 0.44–1.00)
GFR calc non Af Amer: >60 mL/min (ref >60)
GLUCOSE: 107 mg/dL — AB (ref 65–99)
POTASSIUM: 3.5 mmol/L (ref 3.5–5.1)
Sodium: 136 mmol/L (ref 135–145)

## 2017-02-28 LAB — STREP PNEUMONIAE URINARY ANTIGEN: STREP PNEUMO URINARY ANTIGEN: NEGATIVE

## 2017-02-28 LAB — LACTIC ACID, PLASMA
LACTIC ACID, VENOUS: 2.2 mmol/L — AB (ref 0.5–1.9)
Lactic Acid, Venous: 2.8 mmol/L (ref 0.5–1.9)

## 2017-02-28 LAB — CORTISOL: CORTISOL PLASMA: 15.4 ug/dL

## 2017-02-28 LAB — PREPARE RBC (CROSSMATCH)

## 2017-02-28 MED ORDER — HYDROMORPHONE 1 MG/ML IV SOLN
INTRAVENOUS | Status: DC
  Administered 2017-02-28: 13:00:00 via INTRAVENOUS
  Administered 2017-02-28: 2.8 mg via INTRAVENOUS
  Administered 2017-03-01: 3.4 mg via INTRAVENOUS
  Administered 2017-03-01: 2 mg via INTRAVENOUS
  Administered 2017-03-01: 2.2 mg via INTRAVENOUS
  Administered 2017-03-01: 17:00:00 via INTRAVENOUS
  Administered 2017-03-01: 1.4 mg via INTRAVENOUS
  Administered 2017-03-01: 3.4 mg via INTRAVENOUS
  Administered 2017-03-02: 1 mg via INTRAVENOUS
  Administered 2017-03-02: 4.2 mg via INTRAVENOUS
  Filled 2017-02-28 (×2): qty 25

## 2017-02-28 MED ORDER — ONDANSETRON HCL 4 MG/2ML IJ SOLN: 4.0000 mg | Freq: Four times a day (QID) | INTRAMUSCULAR | Status: DC | PRN

## 2017-02-28 MED ORDER — SODIUM CHLORIDE 0.9% FLUSH: 9.0000 mL | INTRAVENOUS | Status: DC | PRN

## 2017-02-28 MED ORDER — SENNOSIDES-DOCUSATE SODIUM 8.6-50 MG PO TABS
1.0000 | ORAL_TABLET | Freq: Two times a day (BID) | ORAL | Status: DC
  Administered 2017-03-01 – 2017-03-02 (×4): 1 via ORAL
  Filled 2017-02-28 (×9): qty 1

## 2017-02-28 MED ORDER — GABAPENTIN 800 MG PO TABS
800.0000 mg | ORAL_TABLET | Freq: Four times a day (QID) | ORAL | Status: DC
  Filled 2017-02-28: qty 1

## 2017-02-28 MED ORDER — PANTOPRAZOLE SODIUM 40 MG PO TBEC
40.0000 mg | DELAYED_RELEASE_TABLET | Freq: Every day | ORAL | Status: DC
  Administered 2017-02-28 – 2017-03-04 (×5): 40 mg via ORAL
  Filled 2017-02-28 (×5): qty 1

## 2017-02-28 MED ORDER — LEVOTHYROXINE SODIUM 75 MCG PO TABS
75.0000 ug | ORAL_TABLET | Freq: Every day | ORAL | Status: DC
  Administered 2017-03-01 – 2017-03-04 (×4): 75 ug via ORAL
  Filled 2017-02-28 (×4): qty 1

## 2017-02-28 MED ORDER — ACETAMINOPHEN 325 MG PO TABS
650.0000 mg | ORAL_TABLET | ORAL | Status: DC | PRN
  Administered 2017-02-28 – 2017-03-04 (×6): 650 mg via ORAL
  Filled 2017-02-28 (×6): qty 2

## 2017-02-28 MED ORDER — SODIUM CHLORIDE 0.9 % IV SOLN
INTRAVENOUS | Status: DC
  Administered 2017-02-28 – 2017-03-02 (×4): via INTRAVENOUS

## 2017-02-28 MED ORDER — NALOXONE HCL 0.4 MG/ML IJ SOLN: 0.4000 mg | INTRAMUSCULAR | Status: DC | PRN

## 2017-02-28 MED ORDER — ALPRAZOLAM 0.25 MG PO TABS
0.2500 mg | ORAL_TABLET | Freq: Three times a day (TID) | ORAL | Status: DC | PRN
  Administered 2017-02-28 – 2017-03-04 (×5): 0.25 mg via ORAL
  Filled 2017-02-28 (×6): qty 1

## 2017-02-28 MED ORDER — PIPERACILLIN-TAZOBACTAM 3.375 G IVPB 30 MIN
3.3750 g | Freq: Once | INTRAVENOUS | Status: AC
  Administered 2017-02-28: 3.375 g via INTRAVENOUS
  Filled 2017-02-28: qty 50

## 2017-02-28 MED ORDER — HEPARIN SODIUM (PORCINE) 5000 UNIT/ML IJ SOLN
5000.0000 [IU] | Freq: Three times a day (TID) | INTRAMUSCULAR | Status: DC
  Administered 2017-02-28 – 2017-03-04 (×12): 5000 [IU] via SUBCUTANEOUS
  Filled 2017-02-28 (×11): qty 1

## 2017-02-28 MED ORDER — MIRTAZAPINE 45 MG PO TABS
45.0000 mg | ORAL_TABLET | Freq: Every day | ORAL | Status: DC
Start: 2017-02-28 — End: 2017-03-04
  Administered 2017-02-28 – 2017-03-03 (×4): 45 mg via ORAL
  Filled 2017-02-28 (×3): qty 1
  Filled 2017-02-28: qty 3
  Filled 2017-02-28: qty 1

## 2017-02-28 MED ORDER — ORAL CARE MOUTH RINSE
15.0000 mL | Freq: Two times a day (BID) | OROMUCOSAL | Status: DC
  Administered 2017-02-28 – 2017-03-04 (×8): 15 mL via OROMUCOSAL

## 2017-02-28 MED ORDER — DIPHENHYDRAMINE HCL 50 MG/ML IJ SOLN: 12.5000 mg | Freq: Four times a day (QID) | INTRAMUSCULAR | Status: DC | PRN

## 2017-02-28 MED ORDER — PIPERACILLIN-TAZOBACTAM 3.375 G IVPB
3.3750 g | Freq: Three times a day (TID) | INTRAVENOUS | Status: DC
  Administered 2017-02-28 – 2017-03-04 (×11): 3.375 g via INTRAVENOUS
  Filled 2017-02-28 (×15): qty 50

## 2017-02-28 MED ORDER — SODIUM CHLORIDE 0.9 % IV SOLN
Freq: Once | INTRAVENOUS | Status: AC
  Administered 2017-02-28: 13:00:00 via INTRAVENOUS

## 2017-02-28 MED ORDER — VANCOMYCIN HCL IN DEXTROSE 750-5 MG/150ML-% IV SOLN
750.0000 mg | Freq: Two times a day (BID) | INTRAVENOUS | Status: DC
  Administered 2017-02-28 – 2017-03-03 (×6): 750 mg via INTRAVENOUS
  Filled 2017-02-28 (×6): qty 150

## 2017-02-28 MED ORDER — HYDROMORPHONE HCL 1 MG/ML IJ SOLN
1.0000 mg | Freq: Once | INTRAMUSCULAR | Status: AC
  Filled 2017-02-28: qty 1

## 2017-02-28 MED ORDER — BACLOFEN 20 MG PO TABS
20.0000 mg | ORAL_TABLET | Freq: Three times a day (TID) | ORAL | Status: DC
  Administered 2017-02-28 – 2017-03-04 (×12): 20 mg via ORAL
  Filled 2017-02-28: qty 2
  Filled 2017-02-28 (×4): qty 1
  Filled 2017-02-28: qty 2
  Filled 2017-02-28: qty 1
  Filled 2017-02-28: qty 2
  Filled 2017-02-28 (×4): qty 1

## 2017-02-28 MED ORDER — DIPHENHYDRAMINE HCL 12.5 MG/5ML PO ELIX: 12.5000 mg | ORAL_SOLUTION | Freq: Four times a day (QID) | ORAL | Status: DC | PRN

## 2017-02-28 MED ORDER — SODIUM CHLORIDE 0.9 % IV SOLN: 250.0000 mL | INTRAVENOUS | Status: DC | PRN

## 2017-02-28 MED ORDER — GABAPENTIN 400 MG PO CAPS
800.0000 mg | ORAL_CAPSULE | Freq: Four times a day (QID) | ORAL | Status: DC
  Administered 2017-02-28 – 2017-03-04 (×16): 800 mg via ORAL
  Filled 2017-02-28 (×16): qty 2

## 2017-02-28 NOTE — Progress Notes (Addendum)
CRITICAL VALUE ALERT  Critical Value:  Hemoglobin 6.6 and Lactic Acid 2.8  Date & Time Notied:  02/28/17 1257   Provider Notified: CCM  Orders Received/Actions taken: I unit PRBCs

## 2017-02-28 NOTE — H&P (Signed)
PULMONARY / CRITICAL CARE MEDICINE   Name: [name] MRN: [id number] DOB: Apr 05, 1951    ADMISSION DATE:  02/28/2017 CONSULTATION DATE:  02/28/17  [facility]  [facility]   CHIEF COMPLAINT:  Concern for Pneumonia   HISTORY OF PRESENT ILLNESS:  66 y/o F admitted from [facility] on 11/2 with complaints of lower extremity weakness and shortness of breath.  CXR with right sided multifocal PNA.  The patient reports she was admitted from 10/29-10/31 for planned L2-4 lumbar decompression surgery.  She is a chronic pain patient on suboxone via pain clinic.  She was taken off suboxone 3 days prior to surgery.  At discharge she was restarted on suboxone and given percocet for pain.  The patient states she was doing well but woke up to go to the restroom on 11/2, put her back brace on an attempted to walk to the bathroom.  She states her legs were weak and knees buckled.  She also felt short of breath.  She denied known fevers, chest pain, nausea/vomiting, diarrhea.  States she had a fever to 100.6  at [facility] and was given tylenol.    [facility] - WBC 1.9, Hgb 9, platelets 194, PT 9.4, ABG 7.62 / 21 / 53 / 21.6, Na 137, K 3.6, Cl 102, CO2 27, BUN 10, Sr Cr 0.70, albumin 3.5, total protein 6.1, troponin <0.01, AST 158, ALT 119, lactic acid 2.6.  UA negative. CXR concerning for multifocal R sided consolidation, mild R hemithoracic volume loss.  She also had a LE venous duplex evaluation which was negative for DVT (not sure by documentation if this was MD bedside eval vs formal US).  Decision was made to [facility].     PAST MEDICAL HISTORY :  She  has a past medical history of Anxiety; Chronic pain; Depression; Dyspnea; GERD (gastroesophageal reflux disease); Headache(784.0); Hypertension; Hypothyroidism; Osteoarthritis; Recurrent pneumonia; and Urinary incontinence.  PAST SURGICAL HISTORY: She  has a past surgical history  that includes Achilles tendon repair; Lumbar laminectomy; Lumbar fusion; Tubal ligation; nerve stimulator insertion and subsequent removal; and Hernia repair.  Allergies  Allergen Reactions  . Fentanyl Diarrhea  . Oxycontin [Oxycodone Hcl]     Hallucinations , can take percocet with no issues     No current facility-administered medications on file prior to encounter.    Current Outpatient Prescriptions on File Prior to Encounter  Medication Sig  . acetaminophen (TYLENOL) 500 MG tablet Take 1,000 mg by mouth 5 (five) times daily.  [name] Kitchen. albuterol (PROVENTIL HFA;VENTOLIN HFA) 108 (90 Base) MCG/ACT inhaler Inhale 1-2 puffs into the lungs every 6 (six) hours as needed for wheezing or shortness of breath.  . ALPRAZolam (XANAX) 0.5 MG tablet Take 0.25-0.5 mg by mouth 3 (three) times daily. Take 0.25 mg in the morning and the afternoon and 0.5 mg at night  . amitriptyline (ELAVIL) 50 MG tablet Take 50 mg by mouth at bedtime.  [name] Kitchen. aspirin 325 MG tablet Take 325 mg by mouth daily.    . baclofen (LIORESAL) 20 MG tablet Take 20 mg by mouth every 8 (eight) hours.   . Buprenorphine HCl-Naloxone HCl (SUBOXONE) 8-2 MG FILM Place 1 Film under the tongue 2 (two) times daily.  . diazepam (VALIUM) 5 MG tablet Take 1-2 tablets (5-10 mg total) by mouth every 6 (six) hours as needed for muscle spasms.  . diclofenac (VOLTAREN) 50 MG EC tablet Take 50 mg by mouth 4 (four) times daily.  .[name] Kitchen  fluorometholone (FML) 0.1 % ophthalmic suspension Place 1 drop into both eyes 2 (two) times daily.  [name] Kitchen FLUoxetine (PROZAC) 20 MG tablet Take 20 mg by mouth at bedtime.  . gabapentin (NEURONTIN) 800 MG tablet Take 800 mg by mouth every 6 (six) hours.  . Guaifenesin 1200 MG TB12 Take 1,200 mg by mouth 2 (two) times daily.  [name] Kitchen levothyroxine (SYNTHROID, LEVOTHROID) 75 MCG tablet Take 75 mcg by mouth daily before breakfast.  . loperamide (IMODIUM A-D) 2 MG tablet Take 2 mg by mouth as needed for diarrhea or loose stools.  . mirtazapine  (REMERON) 45 MG tablet Take 45 mg by mouth at bedtime.    . misoprostol (CYTOTEC) 200 MCG tablet Take 200 mcg by mouth 4 (four) times daily.  . nebivolol (BYSTOLIC) 2.5 MG tablet Take 2.5 mg by mouth daily.    [name] Kitchen oxyCODONE-acetaminophen (PERCOCET/ROXICET) 5-325 MG tablet Take 1-2 tablets by mouth every 4 (four) hours as needed for moderate pain.  . roflumilast (DALIRESP) 500 MCG TABS tablet Take 500 mcg by mouth daily.  . solifenacin (VESICARE) 5 MG tablet Take 5 mg by mouth daily.   . traMADol (ULTRAM) 50 MG tablet Take 50-100 mg by mouth every 6 (six) hours as needed for moderate pain.   [name] Kitchen trimethoprim (TRIMPEX) 100 MG tablet Take 100 mg by mouth at bedtime.    FAMILY HISTORY:  Her indicated that the status of her mother is unknown. She indicated that the status of her father is unknown.    SOCIAL HISTORY: She  reports that she quit smoking about 12 years ago. Her smoking use included Cigarettes. She has a 15.00 pack-year smoking history. She has never used smokeless tobacco. She reports that she does not drink alcohol or use drugs.  REVIEW OF SYSTEMS:  POSITIVES IN BOLD Gen: Denies fever, chills, weight change, fatigue, night sweats HEENT: Denies blurred vision, double vision, hearing loss, tinnitus, sinus congestion, rhinorrhea, sore throat, neck stiffness, dysphagia PULM: Denies shortness of breath, cough, sputum production, hemoptysis, wheezing CV: Denies chest pain, edema, orthopnea, paroxysmal nocturnal dyspnea, palpitations GI: Denies abdominal pain, nausea, vomiting, diarrhea, hematochezia, melena, constipation, change in bowel habits GU: Denies dysuria, hematuria, polyuria, oliguria, urethral discharge Endocrine: Denies hot or cold intolerance, polyuria, polyphagia or appetite change Derm: Denies rash, dry skin, scaling or peeling skin change Heme: Denies easy bruising, bleeding, bleeding gums Neuro: Denies headache, numbness, subjective BLE weakness, slurred speech, loss of  memory or consciousness   SUBJECTIVE:   VITAL SIGNS: BP 116/73   Pulse 97   Resp (!) 26   Ht 5\' 4"  (1.626 m)   Wt 152 lb 12.5 oz (69.3 kg)   SpO2 94%   BMI 26.22 kg/m   HEMODYNAMICS:    VENTILATOR SETTINGS:    INTAKE / OUTPUT: No intake/output data recorded.  PHYSICAL EXAMINATION: General: thin adult female, jittery / anxious HEENT: MM pink/dry.  Scleral icterus.   Neuro: AAOx4, speech clear, MAE, normal strength in BLE to push/pull  CV: s1s2 rrr, no m/r/g PULM: even/non-labored, lungs bilaterally clear  NF:AOZH, non-tender, bsx4 active  Extremities: warm/dry, no edema  Skin: no rashes or lesions.  Posterior lower incision dressing with dried blood, small amt bruising in left lower back into left buttock  LABS:  BMET No results for input(s): NA, K, CL, CO2, BUN, CREATININE, GLUCOSE in the last 168 hours.  Electrolytes No results for input(s): CALCIUM, MG, PHOS in the last 168 hours.  CBC No results for input(s): WBC, HGB, HCT, PLT in  the last 168 hours.  Coag's No results for input(s): APTT, INR in the last 168 hours.  Sepsis Markers No results for input(s): LATICACIDVEN, PROCALCITON, O2SATVEN in the last 168 hours.  ABG No results for input(s): PHART, PCO2ART, PO2ART in the last 168 hours.  Liver Enzymes No results for input(s): AST, ALT, ALKPHOS, BILITOT, ALBUMIN in the last 168 hours.  Cardiac Enzymes No results for input(s): TROPONINI, PROBNP in the last 168 hours.  Glucose No results for input(s): GLUCAP in the last 168 hours.  Imaging No results found.   STUDIES:    CULTURES: BCx2 11/2 >>  Sputum 11/2 >>  UA 11/2 >> negative  ANTIBIOTICS: Vanco 11/2 >>  Zosyn 11/2 >>   SIGNIFICANT EVENTS: 11/02  Admit from [place] with LE weakness, SOB  LINES/TUBES:   DISCUSSION: 66 y/o F with chronic pain & recent lumbar decompression admitted on 11/2 with concern for PNA.   ASSESSMENT / PLAN:  PULMONARY A: Right HCAP  Respiratory  Alkalosis - suspect driven by pain / anxiety  Former Smoker  Hx (remote) Empyema P:   See ID  Pulmonary hygiene- IS, mobilize  Follow CXR   CARDIOVASCULAR A:  Soft/Normal BP Hx HTN P:  ICU monitoring  Hold home bystolic   RENAL A:   Hypokalemia  P:   Trend BMP / urinary output Replace electrolytes as indicated Avoid nephrotoxic agents, ensure adequate renal perfusion  GASTROINTESTINAL A:   Elevated LFT's - ? Related to hypotension, no hx n/v/d GERD  P:   Heart healthy diet as tolerated Trend LFT's   HEMATOLOGIC A:   Anemia - no evidence of acute bleeding  P:  Trend CBC  SCD's + Heparin for DVT prophylaxis   INFECTIOUS A:   R HCAP  P:   Vancomycin + Zosyn for HCAP  Follow cultures as above  Monitor fever curve / WBC trend  Trend PCT  ENDOCRINE A:   Hypothyroidism  P:   Trend glucose on BMP Continue synthroid PO QD  NEUROLOGIC A:   Recent Lumbar Decompression  Pain  Hx Anxiety, Depression, Headaches, Osteoarthritis, Nerve Stimulator P:   RASS goal: n/a Dilaudid PCA at reduced dose  PRN xanax  Continue home baclofen, prozac, neurontin  Hold home elavil, suboxone, valium, percocet, ultram for today    FAMILY  - Updates: Patient and family updated at bedside on plan of care.   Canary Brim, NP-C [facility] Pgr: [phone] or if no answer [phone] 02/28/2017, 12:48 PM

## 2017-02-28 NOTE — Progress Notes (Signed)
eLink Physician-Brief Progress Note Patient Name: [name] DOB: [birth year] MRN: [id number]   Date of Service  02/28/2017  HPI/Events of Note  Nurse reports patient asking for medication for constipation. Chronic opiate therapy.   eICU Interventions  Senokot S ordered twice a day      Intervention Category Intermediate Interventions: Other:  Lawanda CousinsJennings Sylus Stgermain 02/28/2017, 11:51 PM

## 2017-02-28 NOTE — Progress Notes (Signed)
Pharmacy Antibiotic Note [name] is a 66 y.o. female admitted on 02/28/2017 with concern for pneumonia. [facility] and received Zosyn, Levaquin and vancomycin 1500 mg x 1 at 0900 prior to arriving. Pharmacy asked to continue dosing Zosyn and vancomycin.   Plan: Vancomycin 750 IV every 12 hours.  Goal trough 15-20 mcg/mL. Zosyn 3.375g IV q8h (4 hour infusion).  Height: 5\' 4"  (162.6 cm) Weight: 152 lb 12.5 oz (69.3 kg) IBW/kg (Calculated) : 54.7  Temp (24hrs), Avg:98.8 F (37.1 C), Min:98.8 F (37.1 C), Max:98.8 F (37.1 C)   Recent Labs Lab 02/28/17 1219  WBC 2.2*  CREATININE 0.81  LATICACIDVEN 2.8*    Estimated Creatinine Clearance: 67 mL/min (by C-G formula based on SCr of 0.81 mg/dL).    Allergies  Allergen Reactions  . Fentanyl Diarrhea  . Oxycontin [Oxycodone Hcl]     Hallucinations , can take percocet with no issues     Antimicrobials this admission: 11/2 Zosyn  >>  11/2 vancomycin >>   Microbiology results: 11/2 BCx: px  Thank you for allowing pharmacy to be a part of this patient's care.  Pollyann SamplesAndy Caylin Raby, PharmD, BCPS 02/28/2017, 2:54 PM

## 2017-02-28 NOTE — Progress Notes (Signed)
Type and screen resulted at 1521, blood bank called at 1611. Called again, per blood bank, she has an antibody that they are troubleshooting.  Will administer when prepared.

## 2017-03-01 ENCOUNTER — Inpatient Hospital Stay (HOSPITAL_COMMUNITY): Payer: Medicare Other

## 2017-03-01 DIAGNOSIS — J189 Pneumonia, unspecified organism: Secondary | ICD-10-CM

## 2017-03-01 DIAGNOSIS — J9601 Acute respiratory failure with hypoxia: Secondary | ICD-10-CM

## 2017-03-01 LAB — PROCALCITONIN: Procalcitonin: 7.81 ng/mL

## 2017-03-01 LAB — CBC
HCT: 22.4 % — ABNORMAL LOW (ref 36.0–46.0)
Hemoglobin: 7.8 g/dL — ABNORMAL LOW (ref 12.0–15.0)
MCH: 31.8 pg (ref 26.0–34.0)
MCHC: 34.8 g/dL (ref 30.0–36.0)
MCV: 91.4 fL (ref 78.0–100.0)
Platelets: 182 K/uL (ref 150–400)
RBC: 2.45 MIL/uL — ABNORMAL LOW (ref 3.87–5.11)
RDW: 13.8 % (ref 11.5–15.5)
WBC: 6.8 K/uL (ref 4.0–10.5)

## 2017-03-01 LAB — EXPECTORATED SPUTUM ASSESSMENT W GRAM STAIN, RFLX TO RESP C

## 2017-03-01 LAB — BASIC METABOLIC PANEL
Anion gap: 6 (ref 5–15)
BUN: 6 mg/dL (ref 6–20)
CHLORIDE: 107 mmol/L (ref 101–111)
CO2: 24 mmol/L (ref 22–32)
Calcium: 7.9 mg/dL — ABNORMAL LOW (ref 8.9–10.3)
Creatinine, Ser: 0.68 mg/dL (ref 0.44–1.00)
GFR calc Af Amer: >60 mL/min (ref >60)
GFR calc non Af Amer: >60 mL/min (ref >60)
Glucose, Bld: 102 mg/dL — ABNORMAL HIGH (ref 65–99)
POTASSIUM: 3.3 mmol/L — AB (ref 3.5–5.1)
Sodium: 137 mmol/L (ref 135–145)

## 2017-03-01 LAB — HIV ANTIBODY (ROUTINE TESTING W REFLEX): HIV SCREEN 4TH GENERATION: NONREACTIVE

## 2017-03-01 MED ORDER — POTASSIUM CHLORIDE CRYS ER 20 MEQ PO TBCR
20.0000 meq | EXTENDED_RELEASE_TABLET | ORAL | Status: AC
  Administered 2017-03-01 (×2): 20 meq via ORAL
  Filled 2017-03-01 (×2): qty 1

## 2017-03-01 NOTE — Progress Notes (Signed)
[facility] RE[facility] PROTOCOL FOR AM LAB REPLACEMENT ONLY  The patient does apply for the [facility] Electrolyte Replacment Protocol based on the criteria listed below:   1. Is GFR >/= 40 ml/min? Yes.    Patient's GFR today is >60 2. Is urine output >/= 0.5 ml/kg/hr for the last 6 hours? Yes.   Patient's UOP is 3.7 ml/kg/hr 3. Is BUN < 60 mg/dL? Yes.    Patient's BUN today is 6 4. Abnormal electrolyte(s):K3.3 5. Ordered repletion with: per protocol 6. If a panic level lab has been reported, has the CCM MD in charge been notified? Yes.  .   Physician:  Staci AcostaJ Nestor, MD  Melrose NakayamaChisholm, Deveon Kisiel William 03/01/2017 6:12 AM

## 2017-03-01 NOTE — Progress Notes (Signed)
[facility] P[facility]  S:ls better, has some back pain, on PCA pump for pain, breathing OK, ready to eat  Past Medical History:  Diagnosis Date  . Anxiety   . Chronic pain   . Depression   . Dyspnea    with  URI  . GERD (gastroesophageal reflux disease)   . Headache(784.0)    hx. migraines  . Hypertension   . Hypothyroidism   . Osteoarthritis   . Recurrent pneumonia   . Urinary incontinence     O:  Vitals:   03/01/17 0400 03/01/17 0500 03/01/17 0600 03/01/17 0700  BP: 99/61 98/61 118/72 (!) 90/59  Pulse: 86 84 (!) 126 80  Resp: 20 (!) 27 20 (!) 25  Temp: 97.6 F (36.4 C)     TempSrc: Axillary     SpO2: 96% 97% 96% 97%  Weight:  71.2 kg (156 lb 15.5 oz)    Height:       4L [place]  General:  Resting comfortably in bed HENT: NCAT OP clear PULM: CTA B, normal effort CV: RRR, no mgr GI: BS+, soft, nontender MSK: normal bulk and tone Neuro: awake, alert, no distress, MAEW  CBC    Component Value Date/Time   WBC 6.8 03/01/2017 0435   RBC 2.45 (L) 03/01/2017 0435   HGB 7.8 (L) 03/01/2017 0435   HCT 22.4 (L) 03/01/2017 0435   PLT 182 03/01/2017 0435   MCV 91.4 03/01/2017 0435   MCH 31.8 03/01/2017 0435   MCHC 34.8 03/01/2017 0435   RDW 13.8 03/01/2017 0435   LYMPHSABS 1.3 02/20/2017 1345   MONOABS 0.5 02/20/2017 1345   EOSABS 0.2 02/20/2017 1345   BASOSABS 0.1 02/20/2017 1345   BMET    Component Value Date/Time   NA 137 03/01/2017 0435   K 3.3 (L) 03/01/2017 0435   CL 107 03/01/2017 0435   CO2 24 03/01/2017 0435   GLUCOSE 102 (H) 03/01/2017 0435   BUN 6 03/01/2017 0435   CREATININE 0.68 03/01/2017 0435   CALCIUM 7.9 (L) 03/01/2017 0435   GFRNONAA >60 03/01/2017 0435   GFRAA >60 03/01/2017 0435    CXR images reviewed: right lung infiltrate  Impression/plan:  HCAP: continue vanc, zosyn, f/u resp culture, narrow antibiotics tomorrow if culture negative Acute respiratory failure with hypoxemia: continue to wean off O2 to maintain O2 saturation > 90% Back pain  related to recent lumbar decompression: continue dilaudid PCA Anxiety, depression, headaches: continue to hold all home sedating medicines, continue prn xanax Chronic pain: continue baclofen and gabapentin  Feeding: heart healthy diet Analgesia: dilaudid PCA Sedation: n/a Thromboprophylaxis: sub q hep HOB >30 degrees Ulcer prophylaxis: n/a Glucose control: SSI  Transfer to floor, TRH  Heber CarolinaBrent McQuaid, MD [place] [facility] Pager: [phone] Cell: [phone] After 3pm or if no response, call [phone]

## 2017-03-02 ENCOUNTER — Other Ambulatory Visit: Payer: Self-pay

## 2017-03-02 DIAGNOSIS — D62 Acute posthemorrhagic anemia: Secondary | ICD-10-CM

## 2017-03-02 DIAGNOSIS — E876 Hypokalemia: Secondary | ICD-10-CM

## 2017-03-02 LAB — BASIC METABOLIC PANEL
ANION GAP: 6 (ref 5–15)
BUN: <5 mg/dL — ABNORMAL LOW (ref 6–20)
CHLORIDE: 106 mmol/L (ref 101–111)
CO2: 25 mmol/L (ref 22–32)
Calcium: 8.2 mg/dL — ABNORMAL LOW (ref 8.9–10.3)
Creatinine, Ser: 0.67 mg/dL (ref 0.44–1.00)
GFR calc non Af Amer: >60 mL/min (ref >60)
Glucose, Bld: 103 mg/dL — ABNORMAL HIGH (ref 65–99)
POTASSIUM: 3.3 mmol/L — AB (ref 3.5–5.1)
Sodium: 137 mmol/L (ref 135–145)

## 2017-03-02 LAB — PROCALCITONIN: PROCALCITONIN: 4.1 ng/mL

## 2017-03-02 MED ORDER — OXYCODONE-ACETAMINOPHEN 5-325 MG PO TABS
1.0000 | ORAL_TABLET | ORAL | Status: DC | PRN
  Administered 2017-03-02 – 2017-03-04 (×9): 2 via ORAL
  Filled 2017-03-02 (×9): qty 2

## 2017-03-02 MED ORDER — DARIFENACIN HYDROBROMIDE ER 15 MG PO TB24
15.0000 mg | ORAL_TABLET | Freq: Every day | ORAL | Status: DC
  Administered 2017-03-02 – 2017-03-04 (×3): 15 mg via ORAL
  Filled 2017-03-02 (×3): qty 1

## 2017-03-02 MED ORDER — FLUOROMETHOLONE 0.1 % OP SUSP
1.0000 [drp] | Freq: Two times a day (BID) | OPHTHALMIC | Status: DC
  Administered 2017-03-02 – 2017-03-04 (×5): 1 [drp] via OPHTHALMIC
  Filled 2017-03-02: qty 5

## 2017-03-02 MED ORDER — ASPIRIN 325 MG PO TABS
325.0000 mg | ORAL_TABLET | Freq: Every day | ORAL | Status: DC
  Administered 2017-03-02 – 2017-03-04 (×3): 325 mg via ORAL
  Filled 2017-03-02 (×3): qty 1

## 2017-03-02 MED ORDER — POTASSIUM CHLORIDE CRYS ER 20 MEQ PO TBCR
40.0000 meq | EXTENDED_RELEASE_TABLET | Freq: Once | ORAL | Status: AC
  Administered 2017-03-02: 40 meq via ORAL
  Filled 2017-03-02: qty 2

## 2017-03-02 MED ORDER — ALBUTEROL SULFATE (2.5 MG/3ML) 0.083% IN NEBU: 2.5000 mg | INHALATION_SOLUTION | RESPIRATORY_TRACT | Status: DC | PRN

## 2017-03-02 MED ORDER — KETOROLAC TROMETHAMINE 15 MG/ML IJ SOLN
15.0000 mg | Freq: Four times a day (QID) | INTRAMUSCULAR | Status: AC | PRN
Start: 2017-03-02 — End: 2017-03-04
  Administered 2017-03-02 – 2017-03-04 (×6): 15 mg via INTRAVENOUS
  Filled 2017-03-02 (×5): qty 1

## 2017-03-02 MED ORDER — ROFLUMILAST 500 MCG PO TABS
500.0000 ug | ORAL_TABLET | Freq: Every day | ORAL | Status: DC
  Administered 2017-03-02 – 2017-03-04 (×3): 500 ug via ORAL
  Filled 2017-03-02 (×3): qty 1

## 2017-03-02 MED ORDER — GUAIFENESIN ER 1200 MG PO TB12: 1200.0000 mg | ORAL_TABLET | Freq: Two times a day (BID) | ORAL | Status: DC

## 2017-03-02 MED ORDER — HYDROMORPHONE HCL 1 MG/ML IJ SOLN
1.0000 mg | Freq: Four times a day (QID) | INTRAMUSCULAR | Status: DC | PRN
Start: 2017-03-02 — End: 2017-03-04
  Administered 2017-03-02 – 2017-03-04 (×6): 1 mg via INTRAVENOUS
  Filled 2017-03-02 (×6): qty 1

## 2017-03-02 NOTE — Progress Notes (Signed)
Wound check  Incision: c/d/i, no redness/warmth/signs of infection. Healing well. Reassurance provided to patient that bandage off, is okay.

## 2017-03-02 NOTE — Progress Notes (Signed)
PROGRESS NOTE   [name]  [id number]    DOB: 1951/01/05    DOA: 02/28/2017  PCP: Wilmer Floorampbell, Stephen D., MD   I have briefly reviewed patients previous medical records in [facility].  Brief Narrative:  66 year old female, recent hospitalization 02/24/17-02/26/17 when she underwent herniated lumbar decompression and fusion at L2-3 and L3-4, discharged home presented [facility] later to [facility] with weakness and dyspnea and found to have healthcare associated pneumonia and anemia.  She was transferred to ICU under CCM service.  Stabilized and transferred to medical floor/TRH service on 03/02/17.  She also has PMH of empyema and chronic back pain.   Assessment & Plan:   Active Problems:   Weakness   HCAP (healthcare-associated pneumonia)   Acute respiratory failure with hypoxia (HCC)   Healthcare associated pneumonia Blood cultures x2: Negative.  Sputum culture that was sent unfortunately was not acceptable. Empirically started on vancomycin and Zosyn, day 3. Chest x-ray 11/3 showed worsening diffuse infiltrate on the right and minimal opacity in the left base favored to represent atelectasis. Improving.  No fevers or leukocytosis.  Pro-calcitonin decreasing. Continue current management.  Repeat chest x-ray in a.m.  Consider transitioning to oral antibiotics and 24-48 hours.  Incentive spirometry.  Acute respiratory failure with hypoxia Secondary to pneumonia and atelectasis.  Incentive spirometry.  Wean off of oxygen to maintain oxygen saturations >90%.  Chronic back pain/recent lumbar decompression surgery. Surgical wound looks good without evidence of infection.  Requested neurosurgeon on call to have a look on 11/4 (also patient's request) Currently on Dilaudid PCA, wean off-patient agrees.  Transition to as needed Percocet and IV Toradol as needed.  Continue baclofen and gabapentin  Essential hypertension Initially with soft blood pressures.  Now controlled.   Holding Bystolic.  Hypokalemia Replace and follow.  Check magnesium.  Acute blood loss anemia ?  Related to recent surgery.  Hemoglobin 12.6 on 10/25.  Presented with hemoglobin of 6.6 on 11/2.  Status post blood transfusion.  Hemoglobin stable in the mid 7 g range over the last 2 days.  Follow CBCs.  Transfuse if hemoglobin less than 7 g per DL.  Anxiety and depression Continue as needed Xanax and Prozac.  Stable.  Currently holding home Elavil, Valium and Ultram.  Hypothyroid Continue Synthroid.  Former smoker  Remote history of empyema  History of headaches  Elevated LFTs Follow in a.m.  DVT prophylaxis: Subcutaneous heparin Code Status: Full Family Communication: None at bedside Disposition: DC home when medically improved, possibly in 48 hours   Consultants:  CCM-signed off Discussed with neurosurgery 11/4  Procedures:  None  Antimicrobials:  IV vancomycin and Zosyn   Subjective: Multiple complaints.  Ongoing low back pain, better when she stands.  Upset that nursing yesterday removed a "honeycomb dressing" without letting Dr. Dutch QuintPoole know.  Wants to transition off Dilaudid PCA to Percocet and IV Toradol.  Dyspnea improved.  Some cough.  Patient interviewed and examined with her RN in the room.  ROS: As above.  Objective:  Vitals:   03/02/17 0458 03/02/17 0754 03/02/17 1206 03/02/17 1340  BP: 119/68   (!) 145/75  Pulse: 86   93  Resp: 20 (!) 31 16 18   Temp: 98.6 F (37 C)   98 F (36.7 C)  TempSrc: Oral   Oral  SpO2: 97% 93% 97% 95%  Weight:      Height:        Examination:  General exam: Middle-aged female, moderately built and nourished,  sitting in chair this morning.  Does not appear in any distress. Respiratory system: Harsh breath sounds bilaterally, right> left with scattered occasional basal crackles but no wheezing or rhonchi. Respiratory effort normal. Cardiovascular system: S1 & S2 heard, RRR. No JVD, murmurs, rubs, gallops or clicks. No  pedal edema.  Telemetry: Sinus rhythm Gastrointestinal system: Abdomen is nondistended, soft and nontender. No organomegaly or masses felt. Normal bowel sounds heard. Central nervous system: Alert and oriented. No focal neurological deficits. Extremities: Symmetric 5 x 5 power.  Easily able to stand up next to her chair by herself.  Wearing waist belt. Skin: No rashes, lesions or ulcers.  Lumbar decompression surgical site incision healing well without acute findings. Psychiatry: Judgement and insight appear normal. Mood & affect anxious    Data Reviewed: I have personally reviewed following labs and imaging studies  CBC: Recent Labs  Lab 02/28/17 1219 02/28/17 2146 03/01/17 0435  WBC 2.2*  --  6.8  HGB 6.6* 7.7* 7.8*  HCT 19.3* 22.0* 22.4*  MCV 94.1  --  91.4  PLT 157  --  182   Basic Metabolic Panel: Recent Labs  Lab 02/28/17 1219 03/01/17 0435 03/02/17 0542  NA 136 137 137  K 3.5 3.3* 3.3*  CL 113* 107 106  CO2 17* 24 25  GLUCOSE 107* 102* 103*  BUN 7 6 <5*  CREATININE 0.81 0.68 0.67  CALCIUM 7.5* 7.9* 8.2*   Liver Function Tests: No results for input(s): AST, ALT, ALKPHOS, BILITOT, PROT, ALBUMIN in the last 168 hours. Coagulation Profile: No results for input(s): INR, PROTIME in the last 168 hours. Cardiac Enzymes: No results for input(s): CKTOTAL, CKMB, CKMBINDEX, TROPONINI in the last 168 hours. HbA1C: No results for input(s): HGBA1C in the last 72 hours. CBG: No results for input(s): GLUCAP in the last 168 hours.  Recent Results (from the past 240 hour(s))  Culture, blood (routine x 2)     Status: None (Preliminary result)   Collection Time: 02/28/17 12:10 PM  Result Value Ref Range Status   Specimen Description BLOOD LEFT HAND  Final   Special Requests IN PEDIATRIC BOTTLE Blood Culture adequate volume  Final   Culture NO GROWTH 1 DAY  Final   Report Status PENDING  Incomplete  Culture, blood (routine x 2)     Status: None (Preliminary result)    Collection Time: 02/28/17 12:15 PM  Result Value Ref Range Status   Specimen Description BLOOD LEFT HAND  Final   Special Requests IN PEDIATRIC BOTTLE Blood Culture adequate volume  Final   Culture NO GROWTH 1 DAY  Final   Report Status PENDING  Incomplete  Culture, expectorated sputum-assessment     Status: None   Collection Time: 03/01/17 11:14 AM  Result Value Ref Range Status   Specimen Description SPUTUM  Final   Special Requests NONE  Final   Sputum evaluation   Final    Sputum specimen not acceptable for testing.  Please recollect.   Gram Stain Report Called to,Read Back By and Verified With: J HAMZ,RN AT 1414 03/01/17 BY L BENFIELD    Report Status 03/01/2017 FINAL  Final         Radiology Studies: Dg Chest Port 1 View  Result Date: 03/01/2017 CLINICAL DATA:  Possible pneumonia.  Empyema. EXAM: PORTABLE CHEST 1 VIEW COMPARISON:  February 28, 2017 FINDINGS: The opacity in the right lung is more extensive in the interval with poor aeration remaining on the right. Minimal opacity in left base could represent  atelectasis versus subtle infiltrate. The left lung is otherwise clear. The right side of the cardiomediastinal silhouette is not well assessed due to the right-sided opacity. However, within this limitation, no change in the cardiomediastinal silhouette is identified. No pneumothorax. IMPRESSION: 1. Worsening diffuse infiltrate on the right. Minimal opacity in the left base favored to represent atelectasis rather than early infiltrate. No other changes. Electronically Signed   By: Gerome Sam III M.D   On: 03/01/2017 08:00        Scheduled Meds: . baclofen  20 mg Oral Q8H  . gabapentin  800 mg Oral Q6H  . heparin  5,000 Units Subcutaneous Q8H  . levothyroxine  75 mcg Oral QAC breakfast  . mouth rinse  15 mL Mouth Rinse BID  . mirtazapine  45 mg Oral QHS  . pantoprazole  40 mg Oral Q1200  . senna-docusate  1 tablet Oral BID   Continuous Infusions: . sodium  chloride    . sodium chloride 100 mL/hr at 03/02/17 0548  . piperacillin-tazobactam (ZOSYN)  IV 3.375 g (03/02/17 1411)  . vancomycin Stopped (03/02/17 0904)     LOS: 2 days     Halima Fogal, MD, FACP, FHM. [facility] Pager [phone]  If 7PM-7AM, please contact night-coverage [email or web address] Password [email or web address] 03/02/2017, 2:59 PM

## 2017-03-03 ENCOUNTER — Inpatient Hospital Stay (HOSPITAL_COMMUNITY): Payer: Medicare Other

## 2017-03-03 DIAGNOSIS — J810 Acute pulmonary edema: Secondary | ICD-10-CM

## 2017-03-03 LAB — COMPREHENSIVE METABOLIC PANEL
ALBUMIN: 2.4 g/dL — AB (ref 3.5–5.0)
ALK PHOS: 172 U/L — AB (ref 38–126)
ALT: 62 U/L — AB (ref 14–54)
ANION GAP: 8 (ref 5–15)
AST: 26 U/L (ref 15–41)
BILIRUBIN TOTAL: 0.8 mg/dL (ref 0.3–1.2)
CALCIUM: 8.5 mg/dL — AB (ref 8.9–10.3)
CO2: 26 mmol/L (ref 22–32)
CREATININE: 0.71 mg/dL (ref 0.44–1.00)
Chloride: 107 mmol/L (ref 101–111)
GFR calc Af Amer: >60 mL/min (ref >60)
GFR calc non Af Amer: >60 mL/min (ref >60)
GLUCOSE: 90 mg/dL (ref 65–99)
Potassium: 3.6 mmol/L (ref 3.5–5.1)
Sodium: 141 mmol/L (ref 135–145)
TOTAL PROTEIN: 5.3 g/dL — AB (ref 6.5–8.1)

## 2017-03-03 LAB — CBC
HEMATOCRIT: 25 % — AB (ref 36.0–46.0)
HEMOGLOBIN: 8.4 g/dL — AB (ref 12.0–15.0)
MCH: 31.3 pg (ref 26.0–34.0)
MCHC: 33.6 g/dL (ref 30.0–36.0)
MCV: 93.3 fL (ref 78.0–100.0)
Platelets: 226 10*3/uL (ref 150–400)
RBC: 2.68 MIL/uL — ABNORMAL LOW (ref 3.87–5.11)
RDW: 13.6 % (ref 11.5–15.5)
WBC: 6.7 10*3/uL (ref 4.0–10.5)

## 2017-03-03 LAB — MAGNESIUM: Magnesium: 1.7 mg/dL (ref 1.7–2.4)

## 2017-03-03 MED ORDER — FUROSEMIDE 10 MG/ML IJ SOLN
20.0000 mg | Freq: Once | INTRAMUSCULAR | Status: AC
  Administered 2017-03-03: 20 mg via INTRAVENOUS
  Filled 2017-03-03: qty 2

## 2017-03-03 NOTE — Progress Notes (Signed)
PROGRESS NOTE   [name]  UJW:[id number]    DOB: [birth year]    DOA: 02/28/2017  PCP: Wilmer Floorampbell, Stephen D., MD   I have briefly reviewed patients previous medical records in [facility].  Brief Narrative:  66 year old female, recent hospitalization 02/24/17-02/26/17 when she underwent herniated lumbar decompression and fusion at L2-3 and L3-4, discharged home presented 2 days later to [facility] with weakness and dyspnea and found to have healthcare associated pneumonia and anemia.  She was transferred to ICU under CCM service.  Stabilized and transferred to medical floor/TRH service on 03/02/17.  She also has PMH of empyema and chronic back pain.   Assessment & Plan:   Active Problems:   Weakness   HCAP (healthcare-associated pneumonia)   Acute respiratory failure with hypoxia (HCC)   Healthcare associated pneumonia - Blood cultures x2: Negative.  Sputum culture that was sent unfortunately was not acceptable. - Empirically started on vancomycin and Zosyn, day 4. Vancomycin stopped. - Chest x-ray 11/3 showed worsening diffuse infiltrate on the right and minimal opacity in the left base favored to represent atelectasis. - Improving.  No fevers or leukocytosis.  Pro-calcitonin decreasing. - Repeat chest x-ray 11/5 reviewed: Improving right-sided pneumonia. Concern for worsening pulmonary edema in the bases. Gave her dose of IV Lasix 20 mg 1. Follow chest x-ray in a.m. - Possible discharge home 11/6 and consider transitioning to oral Augmentin.  Acute respiratory failure with hypoxia Secondary to pneumonia and atelectasis.  Incentive spirometry.  Hypoxia resolved.  Chronic back pain/recent lumbar decompression surgery. - Surgical wound looks good without evidence of infection.  As per RN, as requested the neurosurgical team evaluated patient's poor surgical wound on 11/4 and had no further recommendations. Taken off Dilaudid PCA on 11/4.  Transitioned to as needed  Percocet and IV Toradol as needed and when necessary IV Dilaudid.  Continue baclofen and gabapentin. Still with significant back pain, lot of it appears to be chronic. Patient is not on Suboxone any longer.  Essential hypertension Initially with soft blood pressures.  Now controlled.  Holding Bystolic and may resume at discharge..  Hypokalemia Replaced. Magnesium normal. Follow BMP in a.m. after Lasix today.  Acute blood loss anemia ?  Related to recent surgery.  Hemoglobin 12.6 on 10/25.  Presented with hemoglobin of 6.6 on 11/2.  Status post blood transfusion, 1 unit PRBC.  Hemoglobin improved to 8.4. Follow CBC in a.m.  Anxiety and depression Continue as needed Xanax and Prozac.  Stable.  Currently holding home Valium (takes for spasms) and Ultram. Resume Elavil.  Hypothyroid Continue Synthroid.  Former smoker  Remote history of empyema  History of headaches  Elevated LFTs Minimally elevated ALT, outpatient follow-up.  DVT prophylaxis: Subcutaneous heparin Code Status: Full Family Communication: None at bedside Disposition: DC home possibly 11/6   Consultants:  CCM-signed off Discussed with neurosurgery 11/4  Procedures:  None  Antimicrobials:  IV vancomycin and Zosyn   Subjective: Ongoing significant back pain, not controlled but Toradol and using when necessary IV Dilaudid. Dyspnea improved but breathing not yet at baseline. Mild dry cough.  ROS: As above.  Objective:  Vitals:   03/02/17 1631 03/02/17 2144 03/03/17 0351 03/03/17 1442  BP:  (!) 143/73 (!) 152/75 (!) 145/72  Pulse: (!) 101 86 71 74  Resp:  20 20 18   Temp:  97.9 F (36.6 C) 98.1 F (36.7 C) 98.3 F (36.8 C)  TempSrc:  Oral Oral Oral  SpO2: 94% 100% 96% 97%  Weight:  65.5 kg (144 lb 8 oz)   Height:        Examination: Patient was interviewed and examined with a female RN in the room. General exam: Middle-aged female, moderately built and nourished, lying comfortably supine in bed.  She got up to use the bedside commode and is in mild painful distress. Respiratory system: Improved breath sounds except in the bases where slightly harsh with few basal crackles. Rest of lung fields clear to auscultation. No increased work of breathing. Cardiovascular system: S1 & S2 heard, RRR. No JVD, murmurs, rubs, gallops or clicks. No pedal edema.  Telemetry: Personally reviewed and in sinus rhythm. Discontinue telemetry. Gastrointestinal system: Abdomen is nondistended, soft and nontender. No organomegaly or masses felt. Normal bowel sounds heard. Stable. Central nervous system: Alert and oriented. No focal neurological deficits. Stable. Extremities: Symmetric 5 x 5 power. Wearing waist belt. Skin: No rashes, lesions or ulcers.  Lumbar decompression surgical site incision healing well without acute findings (as examined on 11/4). Psychiatry: Judgement and insight appear normal. Mood & affect anxious    Data Reviewed: I have personally reviewed following labs and imaging studies  CBC: Recent Labs  Lab 02/28/17 1219 02/28/17 2146 03/01/17 0435 03/03/17 0626  WBC 2.2*  --  6.8 6.7  HGB 6.6* 7.7* 7.8* 8.4*  HCT 19.3* 22.0* 22.4* 25.0*  MCV 94.1  --  91.4 93.3  PLT 157  --  182 226   Basic Metabolic Panel: Recent Labs  Lab 02/28/17 1219 03/01/17 0435 03/02/17 0542 03/03/17 0626  NA 136 137 137 141  K 3.5 3.3* 3.3* 3.6  CL 113* 107 106 107  CO2 17* 24 25 26   GLUCOSE 107* 102* 103* 90  BUN 7 6 <5* <5*  CREATININE 0.81 0.68 0.67 0.71  CALCIUM 7.5* 7.9* 8.2* 8.5*  MG  --   --   --  1.7   Liver Function Tests: Recent Labs  Lab 03/03/17 0626  AST 26  ALT 62*  ALKPHOS 172*  BILITOT 0.8  PROT 5.3*  ALBUMIN 2.4*   Coagulation Profile: No results for input(s): INR, PROTIME in the last 168 hours. Cardiac Enzymes: No results for input(s): CKTOTAL, CKMB, CKMBINDEX, TROPONINI in the last 168 hours. HbA1C: No results for input(s): HGBA1C in the last 72 hours. CBG: No  results for input(s): GLUCAP in the last 168 hours.  Recent Results (from the past 240 hour(s))  Culture, blood (routine x 2)     Status: None (Preliminary result)   Collection Time: 02/28/17 12:10 PM  Result Value Ref Range Status   Specimen Description BLOOD LEFT HAND  Final   Special Requests IN PEDIATRIC BOTTLE Blood Culture adequate volume  Final   Culture NO GROWTH 3 DAYS  Final   Report Status PENDING  Incomplete  Culture, blood (routine x 2)     Status: None (Preliminary result)   Collection Time: 02/28/17 12:15 PM  Result Value Ref Range Status   Specimen Description BLOOD LEFT HAND  Final   Special Requests IN PEDIATRIC BOTTLE Blood Culture adequate volume  Final   Culture NO GROWTH 3 DAYS  Final   Report Status PENDING  Incomplete  Culture, expectorated sputum-assessment     Status: None   Collection Time: 03/01/17 11:14 AM  Result Value Ref Range Status   Specimen Description SPUTUM  Final   Special Requests NONE  Final   Sputum evaluation   Final    Sputum specimen not acceptable for testing.  Please recollect.  Gram Stain Report Called to,Read Back By and Verified With: J HAMZ,RN AT 1414 03/01/17 BY L BENFIELD    Report Status 03/01/2017 FINAL  Final         Radiology Studies: Dg Chest 2 View  Result Date: 03/03/2017 CLINICAL DATA:  Health care associated pneumonia. EXAM: CHEST  2 VIEW COMPARISON:  Radiograph of March 01, 2017. FINDINGS: Stable cardiomediastinal silhouette. No pneumothorax or pleural effusion is noted. Improved diffuse right lung opacity is noted suggesting improving pneumonia. Increased bibasilar interstitial densities are noted concerning for worsening pulmonary edema. Bony thorax is unremarkable. IMPRESSION: Decreased diffuse right lung opacity is noted suggesting improving pneumonia. Increased bibasilar interstitial densities are noted concerning for worsening pulmonary edema. Electronically Signed   By: Lupita Raider, M.D.   On: 03/03/2017  08:19        Scheduled Meds: . aspirin  325 mg Oral Daily  . baclofen  20 mg Oral Q8H  . darifenacin  15 mg Oral Daily  . fluorometholone  1 drop Both Eyes BID  . gabapentin  800 mg Oral Q6H  . heparin  5,000 Units Subcutaneous Q8H  . levothyroxine  75 mcg Oral QAC breakfast  . mouth rinse  15 mL Mouth Rinse BID  . mirtazapine  45 mg Oral QHS  . pantoprazole  40 mg Oral Q1200  . roflumilast  500 mcg Oral Daily  . senna-docusate  1 tablet Oral BID   Continuous Infusions: . sodium chloride    . piperacillin-tazobactam (ZOSYN)  IV 3.375 g (03/03/17 1426)     LOS: 3 days     Lounette Sloan, MD, FACP, FHM. [facility] Pager [phone]  If 7PM-7AM, please contact night-coverage [email or web address] Password [email or web address] 03/03/2017, 4:09 PM

## 2017-03-03 NOTE — Progress Notes (Signed)
Pharmacy Antibiotic Note [name] is a 66 y.o. female admitted on 02/28/2017 with concern for pneumonia. [facility] was [facility] today and received Zosyn, Levaquin and vancomycin 1500 mg x 1 at 0900 prior to arriving. Pharmacy asked to continue dosing Zosyn and vancomycin.   Plan: Stop vancomycin today Zosyn EI 3.375gm IV Q8H Monitor clinical picture, renal function F/U C&S, abx deescalation / LOT  Can we transition to PO abx soon?  Height: 5\' 4"  (162.6 cm) Weight: 144 lb 8 oz (65.5 kg) IBW/kg (Calculated) : 54.7  Temp (24hrs), Avg:98 F (36.7 C), Min:97.9 F (36.6 C), Max:98.1 F (36.7 C)  Recent Labs  Lab 02/28/17 1219 02/28/17 1507 03/01/17 0435 03/02/17 0542 03/03/17 0626  WBC 2.2*  --  6.8  --  6.7  CREATININE 0.81  --  0.68 0.67 0.71  LATICACIDVEN 2.8* 2.2*  --   --   --     Estimated Creatinine Clearance: 61.3 mL/min (by C-G formula based on SCr of 0.71 mg/dL).    Allergies  Allergen Reactions  . Fentanyl Other (See Comments)    02-28-17 Pt reports that she does not have an allergy to this medication  . Oxycontin [Oxycodone Hcl] Other (See Comments)    Hallucinations , can take percocet with no issues     Antimicrobials this admission: 11/2 Zosyn  >>  11/2 vancomycin >> 11/5  Microbiology results: 11/2 BCx: ngtd  Thank you for allowing pharmacy to be a part of this patient's care.  Enzo BiNathan Lakethia Coppess, PharmD, BCPS Clinical Pharmacist Pager [phone] 03/03/2017 12:55 PM

## 2017-03-03 NOTE — Evaluation (Signed)
Physical Therapy Evaluation Patient Details Name: [name] MRN: [id number] DOB: February 25, 1951 Today's Date: 03/03/2017   History of Present Illness  66 year old female, recent hospitalization 02/24/17-02/26/17 when she underwent herniated lumbar decompression and fusion at L2-3 and L3-4, discharged home presented 2 days later to [facility] with weakness and dyspnea and found to have healthcare associated pneumonia and anemia.  She was transferred to ICU under CCM service.  Stabilized and transferred to medical floor/TRH service on 03/02/17.  She also has PMH of empyema and chronic back pain.     Clinical Impression  PTA, pt was mod I with all mobility, receiving intermittent assistance from friend with some ADLs. Upon eval, pt is presenting with extreme anxiety, high levels of pain, generalized weakness in BLE, decreased dynamic balance, postural abnormalities, and limitations in mobility due to pain and fatigue. Pt is mod I for transfers, benefits from close supervision with ambulation. Pt reports she has been walking around the hospital by herself using the IV pole for balance. Pt refuses to use RW during gait to ensure safety and posture despite defiects. Pt desat on RA to SpO2=84% during session today, educated for rest breaks and safety awareness. Pt to follow, recommending home health PT to maximize independence when medically cleared for d/c.     Follow Up Recommendations Home health PT;Supervision - Intermittent    Equipment Recommendations  None recommended by PT    Recommendations for Other Services       Precautions / Restrictions Precautions Precautions: Back Precaution Booklet Issued: No Required Braces or Orthoses: Spinal Brace Spinal Brace: Lumbar corset Restrictions Weight Bearing Restrictions: No      Mobility  Bed Mobility               General bed mobility comments: pt standing next to bedside commode upon arrival, refused to return to bed due  to pain.   Transfers Overall transfer level: Needs assistance Equipment used: None Transfers: Sit to/from Stand Sit to Stand: Supervision         General transfer comment: Supervision for safety with VC's. Pt moving quickly and impulsively.   Ambulation/Gait Ambulation/Gait assistance: Min guard Ambulation Distance (Feet): 120 Feet Assistive device: 1 person hand held assist(1 UE rail ) Gait Pattern/deviations: Step-to pattern;Step-through pattern;Antalgic;Staggering left;Staggering right;Trunk flexed;Narrow base of support;Drifts right/left Gait velocity: impulsivley fast   General Gait Details: Pt ambulating very quickly with mild path deviation, forward trunk flexion and R lean. Cued patient for upright posture and decreased velocity to ensure safety and improved dynamic balance.   Stairs            Wheelchair Mobility    Modified Rankin (Stroke Patients Only)       Balance Overall balance assessment: Needs assistance Sitting-balance support: Feet supported;No upper extremity supported Sitting balance-Leahy Scale: Good     Standing balance support: No upper extremity supported Standing balance-Leahy Scale: Fair                               Pertinent Vitals/Pain Pain Assessment: 0-10 Pain Score: 8  Pain Location: back Pain Descriptors / Indicators: Aching;Operative site guarding;Sore Pain Intervention(s): Patient requesting pain meds-RN notified;Limited activity within patient's tolerance;Monitored during session    Home Living Family/patient expects to be discharged to:: Private residence Living Arrangements: Other relatives Available Help at Discharge: Family;Available PRN/intermittently Type of Home: House Home Access: Stairs to enter Entrance Stairs-Rails: Right;Left Entrance Stairs-Number of Steps: 2 Home  Layout: One level Home Equipment: Walker - 2 wheels      Prior Function Level of Independence: Independent          Comments: Previously reported using RW with ambulation, however today reports she will/would never use one.      Hand Dominance        Extremity/Trunk Assessment        Lower Extremity Assessment Lower Extremity Assessment: Generalized weakness    Cervical / Trunk Assessment Cervical / Trunk Assessment: Other exceptions(forward lean) Cervical / Trunk Exceptions: recent back surgery  Communication   Communication: No difficulties  Cognition Arousal/Alertness: Awake/alert Behavior During Therapy: Anxious Overall Cognitive Status: No family/caregiver present to determine baseline cognitive functioning                                 General Comments: Pt very anxious and tremoring throughout session.       General Comments General comments (skin integrity, edema, etc.): Pt extremly anxious throughout session. SpO2=97% at rest on RA, de sat to 84% after 75 feet of ambulation, rises >90% with rest breaks on RA. Discussed use of AD with patient, pt refuses use of AD.     Exercises     Assessment/Plan    PT Assessment Patient needs continued PT services  PT Problem List Decreased strength;Decreased balance;Decreased activity tolerance;Decreased mobility;Decreased safety awareness;Pain       PT Treatment Interventions Gait training;DME instruction;Stair training;Functional mobility training;Therapeutic activities;Therapeutic exercise;Balance training    PT Goals (Current goals can be found in the Care Plan section)  Acute Rehab PT Goals Patient Stated Goal: go home, reduce pain PT Goal Formulation: With patient Time For Goal Achievement: 03/10/17 Potential to Achieve Goals: Good    Frequency Min 3X/week   Barriers to discharge   lives alone    Co-evaluation               AM-PAC PT "6 Clicks" Daily Activity  Outcome Measure Difficulty turning over in bed (including adjusting bedclothes, sheets and blankets)?: A Little Difficulty moving from  lying on back to sitting on the side of the bed? : A Little Difficulty sitting down on and standing up from a chair with arms (e.g., wheelchair, bedside commode, etc,.)?: A Little Help needed moving to and from a bed to chair (including a wheelchair)?: A Little Help needed walking in hospital room?: A Little Help needed climbing 3-5 steps with a railing? : A Little 6 Click Score: 18    End of Session Equipment Utilized During Treatment: Gait belt;Back brace Activity Tolerance: Patient limited by pain;Patient limited by fatigue Patient left: in bed;with call bell/phone within reach Nurse Communication: Mobility status PT Visit Diagnosis: Pain;Other abnormalities of gait and mobility (R26.89);Muscle weakness (generalized) (M62.81);Difficulty in walking, not elsewhere classified (R26.2) Pain - Right/Left: Right Pain - part of body: Hip(Back )    Time: 1045-1130 PT Time Calculation (min) (ACUTE ONLY): 45 min   Charges:   PT Evaluation $PT Eval Moderate Complexity: 1 Mod PT Treatments $Gait Training: 8-22 mins   PT G Codes:        Etta GrandchildSean Akiyah Eppolito, PT, DPT Acute Rehab Services Pager: [phone]    Etta GrandchildSean  Shantina Chronister 03/03/2017, 11:51 AM

## 2017-03-03 NOTE — Evaluation (Addendum)
Occupational Therapy Evaluation and Discharge Patient Details Name: [name] MRN: [id number] DOB: [birth year] Today's Date: 03/03/2017    History of Present Illness 66 year old female, recent hospitalization 02/24/17-02/26/17 when she underwent herniated lumbar decompression and fusion at L2-3 and L3-4, discharged home presented 2 days later to [facility] with weakness and dyspnea and found to have healthcare associated pneumonia and anemia.  She was transferred to ICU under CCM service.  Stabilized and transferred to medical floor/TRH service on 03/02/17.  She also has PMH of empyema and chronic back pain.   Clinical Impression   Pt is functioning at a modified independent level in ADL and ADL transfers. She has been routinely walking in the hall unassisted. Pt was able to state 3/3 back precautions. No further acute needs, will defer IADL to HHOT.    Follow Up Recommendations  Home health OT;Supervision/Assistance - 24 hour    Equipment Recommendations  Tub/shower seat    Recommendations for Other Services       Precautions / Restrictions Precautions Precautions: Back Precaution Booklet Issued: No Required Braces or Orthoses: Spinal Brace Spinal Brace: Lumbar corset;Applied in sitting position Restrictions Weight Bearing Restrictions: No      Mobility Bed Mobility               General bed mobility comments: pt on commode upon entry, left in the chair  Transfers Overall transfer level: Modified independent Equipment used: None             General transfer comment: pushed IV pole    Balance Overall balance assessment: Modified Independent                                         ADL either performed or assessed with clinical judgement   ADL Overall ADL's : Modified independent                                             Vision Baseline Vision/History: Wears glasses Wears Glasses: At all times Patient  Visual Report: No change from baseline       Perception     Praxis      Pertinent Vitals/Pain Pain Assessment: 0-10 Pain Score: 7  Pain Location: back Pain Descriptors / Indicators: Aching;Operative site guarding;Sore Pain Intervention(s): Monitored during session;Repositioned     Hand Dominance Right   Extremity/Trunk Assessment Upper Extremity Assessment Upper Extremity Assessment: Overall WFL for tasks assessed(arthritic changes in hands)   Lower Extremity Assessment Lower Extremity Assessment: Defer to PT evaluation   Cervical / Trunk Assessment Cervical / Trunk Assessment: Other exceptions Cervical / Trunk Exceptions: recent back surgery   Communication Communication Communication: No difficulties   Cognition Arousal/Alertness: Awake/alert Behavior During Therapy: Anxious;Impulsive Overall Cognitive Status: No family/caregiver present to determine baseline cognitive functioning                                 General Comments: pt in constant motion and wanting to walk   General Comments       Exercises     Shoulder Instructions      Home Living Family/patient expects to be discharged to:: Private residence Living Arrangements: Alone Available Help at Discharge: Available PRN/intermittently;Friend(s) Type of  Home: House Home Access: Stairs to enter Entergy CorporationEntrance Stairs-Number of Steps: 2 Entrance Stairs-Rails: Right;Left Home Layout: One level     Bathroom Shower/Tub: Chief Strategy OfficerTub/shower unit   Bathroom Toilet: Standard     Home Equipment: Environmental consultantWalker - 2 wheels          Prior Functioning/Environment Level of Independence: Independent        Comments: pt with inconsistent responses to PLOF, home set up        OT Problem List:        OT Treatment/Interventions:      OT Goals(Current goals can be found in the care plan section) Acute Rehab OT Goals Patient Stated Goal: go home, reduce pain  OT Frequency:     Barriers to D/C:             Co-evaluation              AM-PAC PT "6 Clicks" Daily Activity     Outcome Measure                 End of Session Equipment Utilized During Treatment: Back brace Nurse Communication: Mobility status(ok to walk by herself)  Activity Tolerance: Patient tolerated treatment well Patient left: in chair;with call bell/phone within reach  OT Visit Diagnosis: Pain                Time: 15:02-15:41 OT Time Calculation (min): 39 Charges:  OT General Charges $OT Visit: 1 Visit OT Evaluation $OT Eval Moderate Complexity: 1 Mod, 2 self care G-Codes:     03/03/2017 Martie RoundJulie Mirriam Turner, OTR/L Pager: [phone]  Courtney PlanasMayberry, Courtney BailiffJulie Lynn 03/03/2017, 4:14 PM

## 2017-03-04 ENCOUNTER — Inpatient Hospital Stay (HOSPITAL_COMMUNITY): Payer: Medicare Other

## 2017-03-04 LAB — TYPE AND SCREEN
ABO/RH(D): B POS
ANTIBODY SCREEN: POSITIVE
DONOR AG TYPE: NEGATIVE
DONOR AG TYPE: NEGATIVE
Unit division: 0
Unit division: 0

## 2017-03-04 LAB — CBC
HEMATOCRIT: 25 % — AB (ref 36.0–46.0)
Hemoglobin: 8.4 g/dL — ABNORMAL LOW (ref 12.0–15.0)
MCH: 31.2 pg (ref 26.0–34.0)
MCHC: 33.6 g/dL (ref 30.0–36.0)
MCV: 92.9 fL (ref 78.0–100.0)
Platelets: 289 10*3/uL (ref 150–400)
RBC: 2.69 MIL/uL — ABNORMAL LOW (ref 3.87–5.11)
RDW: 13.6 % (ref 11.5–15.5)
WBC: 6.3 10*3/uL (ref 4.0–10.5)

## 2017-03-04 LAB — BPAM RBC
Blood Product Expiration Date: 201811082359
Blood Product Expiration Date: 201811082359
ISSUE DATE / TIME: 201811021755
UNIT TYPE AND RH: 7300
Unit Type and Rh: 7300

## 2017-03-04 LAB — BASIC METABOLIC PANEL
Anion gap: 10 (ref 5–15)
CALCIUM: 8.8 mg/dL — AB (ref 8.9–10.3)
CHLORIDE: 107 mmol/L (ref 101–111)
CO2: 25 mmol/L (ref 22–32)
CREATININE: 0.76 mg/dL (ref 0.44–1.00)
GFR calc Af Amer: >60 mL/min (ref >60)
GFR calc non Af Amer: >60 mL/min (ref >60)
GLUCOSE: 97 mg/dL (ref 65–99)
Potassium: 3.7 mmol/L (ref 3.5–5.1)
Sodium: 142 mmol/L (ref 135–145)

## 2017-03-04 LAB — PROCALCITONIN: Procalcitonin: 2.03 ng/mL

## 2017-03-04 MED ORDER — FERROUS SULFATE 325 (65 FE) MG PO TABS: 325.0000 mg | ORAL_TABLET | Freq: Two times a day (BID) | ORAL | 3 refills | Status: DC

## 2017-03-04 MED ORDER — ACETAMINOPHEN 500 MG PO TABS: 1000.0000 mg | ORAL_TABLET | Freq: Three times a day (TID) | ORAL | 0 refills | Status: AC | PRN

## 2017-03-04 MED ORDER — FERROUS SULFATE 325 (65 FE) MG PO TABS: 325.0000 mg | ORAL_TABLET | Freq: Two times a day (BID) | ORAL | Status: DC

## 2017-03-04 MED ORDER — AMOXICILLIN-POT CLAVULANATE 875-125 MG PO TABS: 1.0000 | ORAL_TABLET | Freq: Two times a day (BID) | ORAL | 0 refills | Status: AC

## 2017-03-04 NOTE — Consult Note (Signed)
   [facility] CM Inpatient Consult   03/04/2017  XZANDRIA CLEVINGER [birth year] [id number]   Patient assessed for re-admission in the [place]. Chart review reveals from MD notes as written, patient is a 66 year old female, recent hospitalization 02/24/17-02/26/17 when she underwent herniated lumbar decompression and fusion at L2-3 and L3-4, discharged home presented 2 days later to [facility] with weakness and dyspnea and found to have healthcare associated pneumonia and anemia.  Met with the patient at the bedside, patient states she thinks she is being discharged.  She endorses Dr. Jenean Lindau, of [facility] as her primary care provider.  This office is listed to provide the transition of care calls and follow up.  Introduced the patient to [place] management services and follow up for Pneumonia EMMI calls.  Patient agreed and information provided with [place] Management contact.  For questions, please contact:  Natividad Brood, RN BSN [facility] Liaison  [phone] business mobile phone Toll free office [phone]

## 2017-03-04 NOTE — Progress Notes (Addendum)
Physical Therapy Treatment and Discharge Patient Details Name: [name] MRN: [id number] DOB: 11-06-1950 Today's Date: 03/04/2017    History of Present Illness 66 year old female, recent hospitalization 02/24/17-02/26/17 when she underwent herniated lumbar decompression and fusion at L2-3 and L3-4, discharged home presented 2 days later to [facility] with weakness and dyspnea and found to have healthcare associated pneumonia and anemia.  She was transferred to ICU under CCM service.  Stabilized and transferred to medical floor/TRH service on 03/02/17.  She also has PMH of empyema and chronic back pain.    PT Comments    Session focused on stair/gait training and progressing activity tolerance. Pt SpO2= >90% throughout session with increased ambulation, an improvement since yesterday. Pt also demonstrating improved dynamic balance without UE support during gait. Posture is slightly improved. Pt ascended/descended 1x8 stairs with intermittent UE support on rail and educated on safe sequencing in light of weaker/more painful RLE. Pt reports no concerns for safety with stairs or ambulation and states strong preference to return home which I feel she is safe to do from a mobility standpoint. All questions have been answered, pt is meeting all functional goals at this time. PT to sign off with no further acute needs, recommending home health PT when medically cleared for d/c.    Follow Up Recommendations  Home health PT;Supervision - Intermittent     Equipment Recommendations  None recommended by PT    Recommendations for Other Services       Precautions / Restrictions Precautions Precautions: Back Precaution Booklet Issued: No Required Braces or Orthoses: Spinal Brace Spinal Brace: Lumbar corset Restrictions Weight Bearing Restrictions: No    Mobility  Bed Mobility               General bed mobility comments: Pt standing in room upon entry  Transfers Overall  transfer level: Modified independent Equipment used: None Transfers: Sit to/from Stand Sit to Stand: Modified independent (Device/Increase time)         General transfer comment: cued to not hold onto IV pole, pt able to sit<>stand without UE support  Ambulation/Gait Ambulation/Gait assistance: Modified independent (Device/Increase time) Ambulation Distance (Feet): 300 Feet Assistive device: None Gait Pattern/deviations: Step-to pattern;Step-through pattern;Antalgic;Staggering left;Staggering right;Trunk flexed;Narrow base of support;Drifts right/left Gait velocity: impulsivley fast   General Gait Details: Cued pt for decreased velocity for safety. Pt ambulating less forward flexion from yseterday.    Stairs Stairs: Yes   Stair Management: One rail Right;One rail Left;Step to pattern Number of Stairs: 8 General stair comments: Cued patient for sequencing, pt able to demonstrate safe techinque with stronger and weaker LE. intermittent UE support on Rail during stair training.   Wheelchair Mobility    Modified Rankin (Stroke Patients Only)       Balance Overall balance assessment: Needs assistance Sitting-balance support: Feet supported;No upper extremity supported Sitting balance-Leahy Scale: Good     Standing balance support: No upper extremity supported Standing balance-Leahy Scale: Good Standing balance comment: pt able to maintan standing balance for >1 minute without UE support. Able to ambulate 300 feet without LOB and no UE suport.                             Cognition Arousal/Alertness: Awake/alert Behavior During Therapy: Anxious;Impulsive Overall Cognitive Status: No family/caregiver present to determine baseline cognitive functioning  General Comments: pt in constant motion and wanting to walk      Exercises      General Comments General comments (skin integrity, edema, etc.): VSS throuought  session. SpO2=92% after 150 feet of ambulation. Pt has been walking the halls by herself throughout hospital stay.        Pertinent Vitals/Pain Pain Assessment: 0-10 Pain Score: 7  Pain Location: back Pain Descriptors / Indicators: Aching;Operative site guarding;Sore Pain Intervention(s): Monitored during session;Limited activity within patient's tolerance    Home Living                      Prior Function            PT Goals (current goals can now be found in the care plan section) Acute Rehab PT Goals Patient Stated Goal: go home, reduce pain PT Goal Formulation: With patient Time For Goal Achievement: 03/10/17 Potential to Achieve Goals: Good Progress towards PT goals: Goals met/education completed, patient discharged from PT    Frequency           PT Plan Current plan remains appropriate    Co-evaluation              AM-PAC PT "6 Clicks" Daily Activity  Outcome Measure  Difficulty turning over in bed (including adjusting bedclothes, sheets and blankets)?: A Little Difficulty moving from lying on back to sitting on the side of the bed? : A Little Difficulty sitting down on and standing up from a chair with arms (e.g., wheelchair, bedside commode, etc,.)?: A Little Help needed moving to and from a bed to chair (including a wheelchair)?: A Little Help needed walking in hospital room?: A Little Help needed climbing 3-5 steps with a railing? : A Little 6 Click Score: 18    End of Session Equipment Utilized During Treatment: Back brace Activity Tolerance: Patient limited by pain;Patient limited by fatigue Patient left: with nursing/sitter in room;with call bell/phone within reach;in bed Nurse Communication: Mobility status PT Visit Diagnosis: Pain;Other abnormalities of gait and mobility (R26.89);Muscle weakness (generalized) (M62.81);Difficulty in walking, not elsewhere classified (R26.2) Pain - Right/Left: Right Pain - part of body: Hip      Time: 0800-0830 PT Time Calculation (min) (ACUTE ONLY): 30 min  Charges:  $Gait Training: 8-22 mins                    G Codes:       Reinaldo Berber, PT, DPT Acute Rehab Services Pager: [phone]     Reinaldo Berber 03/04/2017, 8:44 AM

## 2017-03-04 NOTE — Care Management Important Message (Signed)
Important Message  Patient Details  Name: [name] MRN: [id number] Date of Birth: 08-22-1950   Medicare Important Message Given:  Yes    Casin Federici Stefan ChurchBratton 03/04/2017, 11:43 AM

## 2017-03-04 NOTE — Discharge Summary (Signed)
Physician Discharge Summary  [name] QMV:[id number] DOB: 02/10/1951 DOA: 02/28/2017  PCP: Wilmer Floor., MD  Admit date: 02/28/2017 Discharge date: 03/04/2017  Admitted From: Home  Disposition:  Home  Recommendations for Outpatient Follow-up:  1. Follow up with PCP in 1-2 weeks 2. Please obtain BMP/CBC in one week 3. Follow chest xray for resolution of PNA>   Home Health: yes   Discharge Condition; stable.  CODE STATUS: full code.  Diet recommendation: Heart Healthy  Brief/Interim Summary: 66 year old female, recent hospitalization 02/24/17-02/26/17 when she underwent herniated lumbar decompression and fusion at L2-3 and L3-4, discharged home presented 2 days later to [facility] with weakness and dyspnea and found to have healthcare associated pneumonia and anemia.  She was transferred to ICU under CCM service.  Stabilized and transferred to medical floor/TRH service on 03/02/17.  She also has PMH of empyema and chronic back pain.   Assessment & Plan:   Active Problems:   Weakness   HCAP (healthcare-associated pneumonia)   Acute respiratory failure with hypoxia (HCC)   Healthcare associated pneumonia - Blood cultures x2: Negative.  Sputum culture that was sent unfortunately was not acceptable. - Empirically started on vancomycin and Zosyn, day 4. Vancomycin stopped. - Chest x-ray 11/3 showed worsening diffuse infiltrate on the right and minimal opacity in the left base favored to represent atelectasis. - Improving.  No fevers or leukocytosis.  Pro-calcitonin decreasing. - Repeat chest x-ray 11/5 reviewed: Improving right-sided pneumonia. Concern for worsening pulmonary edema in the bases. received IV lasix. Chest xray stable.  -she is feeling better, wants to go home. WBC normal. MRSA PCR negative.  She will be discharge on Augmentin for 7 days.  Hypoxemia resolved.   Acute respiratory failure with hypoxia Secondary to pneumonia and atelectasis.   Incentive spirometry.  Hypoxia resolved.  Chronic back pain/recent lumbar decompression surgery. - Surgical wound looks good without evidence of infection.  As per RN, as requested the neurosurgical team evaluated patient's poor surgical wound on 11/4 and had no further recommendations. Taken off Dilaudid PCA on 11/4.  Transitioned to as needed Percocet and IV Toradol as needed and when necessary IV Dilaudid.  Continue baclofen and gabapentin. Still with significant back pain, lot of it appears to be chronic. Patient is not on Suboxone any longer.  Essential hypertension Initially with soft blood pressures.  Now controlled.  Holding Bystolic and  resume at discharge..  Hypokalemia Normalized.   Acute blood loss anemia ?  Related to recent surgery.  Hemoglobin 12.6 on 10/25.  Presented with hemoglobin of 6.6 on 11/2.  Status post blood transfusion, 1 unit PRBC.  Hemoglobin improved to 8.4. -hb stable. Discharge on oral iron.   Anxiety and depression Continue as needed Xanax and Prozac.  Stable.  Currently holding home Valium (takes for spasms) and Ultram. Resume Elavil.  Hypothyroid Continue Synthroid.  Former smoker  Remote history of empyema  History of headaches  Elevated LFTs Minimally elevated ALT, outpatient follow-up.    Discharge Diagnoses:  Active Problems:   Weakness   HCAP (healthcare-associated pneumonia)   Acute respiratory failure with hypoxia [facility])    Discharge Instructions  Discharge Instructions    Diet - low sodium heart healthy   Complete by:  As directed    Increase activity slowly   Complete by:  As directed      Allergies as of 03/04/2017      Reactions   Fentanyl Other (See Comments)   02-28-17 Pt reports that she does not  have an allergy to this medication   Oxycontin [oxycodone Hcl] Other (See Comments)   Hallucinations , can take percocet with no issues       Medication List    STOP taking these medications   SUBOXONE 8-2  MG Film Generic drug:  Buprenorphine HCl-Naloxone HCl     TAKE these medications   acetaminophen 500 MG tablet Commonly known as:  TYLENOL Take 2 tablets (1,000 mg total) every 8 (eight) hours as needed by mouth. What changed:    when to take this  reasons to take this   albuterol 108 (90 Base) MCG/ACT inhaler Commonly known as:  PROVENTIL HFA;VENTOLIN HFA Inhale 1-2 puffs into the lungs every 6 (six) hours as needed for wheezing or shortness of breath.   ALPRAZolam 0.5 MG tablet Commonly known as:  XANAX Take 0.25-0.5 mg by mouth 3 (three) times daily. Take 0.25 mg in the morning and the afternoon and 0.5 mg at night   amitriptyline 50 MG tablet Commonly known as:  ELAVIL Take 50 mg by mouth at bedtime.   amoxicillin-clavulanate 875-125 MG tablet Commonly known as:  AUGMENTIN Take 1 tablet 2 (two) times daily for 10 days by mouth.   aspirin 325 MG tablet Take 325 mg by mouth daily.   baclofen 20 MG tablet Commonly known as:  LIORESAL Take 20 mg by mouth every 8 (eight) hours.   DALIRESP 500 MCG Tabs tablet Generic drug:  roflumilast Take 500 mcg by mouth daily.   diazepam 5 MG tablet Commonly known as:  VALIUM Take 1-2 tablets (5-10 mg total) by mouth every 6 (six) hours as needed for muscle spasms.   diclofenac 50 MG EC tablet Commonly known as:  VOLTAREN Take 50 mg by mouth 4 (four) times daily.   ferrous sulfate 325 (65 FE) MG tablet Take 1 tablet (325 mg total) 2 (two) times daily with a meal by mouth.   fluorometholone 0.1 % ophthalmic suspension Commonly known as:  FML Place 1 drop into both eyes 2 (two) times daily.   FLUoxetine 20 MG tablet Commonly known as:  PROZAC Take 20 mg by mouth at bedtime.   gabapentin 800 MG tablet Commonly known as:  NEURONTIN Take 800 mg by mouth every 6 (six) hours.   Guaifenesin 1200 MG Tb12 Take 1,200 mg by mouth 2 (two) times daily.   levothyroxine 75 MCG tablet Commonly known as:  SYNTHROID,  LEVOTHROID Take 75 mcg by mouth daily before breakfast.   loperamide 2 MG tablet Commonly known as:  IMODIUM A-D Take 2 mg by mouth as needed for diarrhea or loose stools.   mirtazapine 45 MG tablet Commonly known as:  REMERON Take 45 mg by mouth at bedtime.   misoprostol 200 MCG tablet Commonly known as:  CYTOTEC Take 200 mcg by mouth 4 (four) times daily.   nebivolol 2.5 MG tablet Commonly known as:  BYSTOLIC Take 2.5 mg by mouth daily.   oxyCODONE-acetaminophen 5-325 MG tablet Commonly known as:  PERCOCET/ROXICET Take 1-2 tablets by mouth every 4 (four) hours as needed for moderate pain.   solifenacin 10 MG tablet Commonly known as:  VESICARE Take 10 mg by mouth daily.   trimethoprim 100 MG tablet Commonly known as:  TRIMPEX Take 100 mg by mouth at bedtime.       Allergies  Allergen Reactions  . Fentanyl Other (See Comments)    02-28-17 Pt reports that she does not have an allergy to this medication  . Oxycontin [Oxycodone Hcl]  Other (See Comments)    Hallucinations , can take percocet with no issues     Consultations:  none   Procedures/Studies: Dg Chest 2 View  Result Date: 03/04/2017 CLINICAL DATA:  Shortness of breath.  Septate.  Post back surgery. EXAM: CHEST  2 VIEW COMPARISON:  03/03/2017 FINDINGS: Patient rotated minimally right on the frontal. Midline trachea. Mild cardiomegaly. No pleural effusion or pneumothorax. Right greater than left interstitial and airspace disease is similar, given slight differences in technique. Mild right hemidiaphragm elevation. IMPRESSION: Similar appearance of right greater than left interstitial and airspace disease. Favor infection. Asymmetric pulmonary edema felt less likely. Electronically Signed   By: Jeronimo Greaves M.D.   On: 03/04/2017 10:43   Dg Chest 2 View  Result Date: 03/03/2017 CLINICAL DATA:  Health care associated pneumonia. EXAM: CHEST  2 VIEW COMPARISON:  Radiograph of March 01, 2017. FINDINGS: Stable  cardiomediastinal silhouette. No pneumothorax or pleural effusion is noted. Improved diffuse right lung opacity is noted suggesting improving pneumonia. Increased bibasilar interstitial densities are noted concerning for worsening pulmonary edema. Bony thorax is unremarkable. IMPRESSION: Decreased diffuse right lung opacity is noted suggesting improving pneumonia. Increased bibasilar interstitial densities are noted concerning for worsening pulmonary edema. Electronically Signed   By: Lupita Raider, M.D.   On: 03/03/2017 08:19   Dg Lumbar Spine 2-3 Views  Result Date: 02/24/2017 CLINICAL DATA:  66 y/o  F; L2-L4 posterior instrumented fusion. EXAM: LUMBAR SPINE - 2-3 VIEW; DG C-ARM 61-120 MIN COMPARISON:  10/29/2016 lumbar spine radiographs FINDINGS: Two images of intraoperative fluoroscopy demonstrating interbody fusion at L4-5 and posterior instrumented and interbody fusion at L2-L4. Fluoro time is 38 seconds. IMPRESSION: Intraoperative fluoroscopy of L2-L4 PLIF. Fluoro time is 38 seconds. Electronically Signed   By: Mitzi Hansen M.D.   On: 02/24/2017 19:37   Dg Chest Port 1 View  Result Date: 03/01/2017 CLINICAL DATA:  Possible pneumonia.  Empyema. EXAM: PORTABLE CHEST 1 VIEW COMPARISON:  February 28, 2017 FINDINGS: The opacity in the right lung is more extensive in the interval with poor aeration remaining on the right. Minimal opacity in left base could represent atelectasis versus subtle infiltrate. The left lung is otherwise clear. The right side of the cardiomediastinal silhouette is not well assessed due to the right-sided opacity. However, within this limitation, no change in the cardiomediastinal silhouette is identified. No pneumothorax. IMPRESSION: 1. Worsening diffuse infiltrate on the right. Minimal opacity in the left base favored to represent atelectasis rather than early infiltrate. No other changes. Electronically Signed   By: Gerome Sam III M.D   On: 03/01/2017 08:00    Dg C-arm 1-60 Min  Result Date: 02/24/2017 CLINICAL DATA:  66 y/o  F; L2-L4 posterior instrumented fusion. EXAM: LUMBAR SPINE - 2-3 VIEW; DG C-ARM 61-120 MIN COMPARISON:  10/29/2016 lumbar spine radiographs FINDINGS: Two images of intraoperative fluoroscopy demonstrating interbody fusion at L4-5 and posterior instrumented and interbody fusion at L2-L4. Fluoro time is 38 seconds. IMPRESSION: Intraoperative fluoroscopy of L2-L4 PLIF. Fluoro time is 38 seconds. Electronically Signed   By: Mitzi Hansen M.D.   On: 02/24/2017 19:37      Subjective: She is feeling better. She was walking in the hall. Cough improving.   Discharge Exam: Vitals:   03/04/17 0522 03/04/17 0700  BP: (!) 149/67   Pulse: 86   Resp: 16   Temp: (!) 100.4 F (38 C) 99.1 F (37.3 C)  SpO2: 96%    Vitals:   03/03/17 1442 03/03/17 2127  03/04/17 0522 03/04/17 0700  BP: (!) 145/72 134/64 (!) 149/67   Pulse: 74 87 86   Resp: 18 18 16    Temp: 98.3 F (36.8 C) 98.9 F (37.2 C) (!) 100.4 F (38 C) 99.1 F (37.3 C)  TempSrc: Oral Oral Oral Oral  SpO2: 97% 98% 96%   Weight:    67.5 kg (148 lb 11.5 oz)  Height:        General: Pt is alert, awake, not in acute distress Cardiovascular: RRR, S1/S2 +, no rubs, no gallops Respiratory: CTA bilaterally, no wheezing, no rhonchi Abdominal: Soft, NT, ND, bowel sounds + Extremities: no edema, no cyanosis    The results of significant diagnostics from this hospitalization (including imaging, microbiology, ancillary and laboratory) are listed below for reference.     Microbiology: Recent Results (from the past 240 hour(s))  Culture, blood (routine x 2)     Status: None (Preliminary result)   Collection Time: 02/28/17 12:10 PM  Result Value Ref Range Status   Specimen Description BLOOD LEFT HAND  Final   Special Requests IN PEDIATRIC BOTTLE Blood Culture adequate volume  Final   Culture NO GROWTH 3 DAYS  Final   Report Status PENDING  Incomplete   Culture, blood (routine x 2)     Status: None (Preliminary result)   Collection Time: 02/28/17 12:15 PM  Result Value Ref Range Status   Specimen Description BLOOD LEFT HAND  Final   Special Requests IN PEDIATRIC BOTTLE Blood Culture adequate volume  Final   Culture NO GROWTH 3 DAYS  Final   Report Status PENDING  Incomplete  Culture, expectorated sputum-assessment     Status: None   Collection Time: 03/01/17 11:14 AM  Result Value Ref Range Status   Specimen Description SPUTUM  Final   Special Requests NONE  Final   Sputum evaluation   Final    Sputum specimen not acceptable for testing.  Please recollect.   Gram Stain Report Called to,Read Back By and Verified With: J HAMZ,RN AT 1414 03/01/17 BY L BENFIELD    Report Status 03/01/2017 FINAL  Final     Labs: BNP (last 3 results) No results for input(s): BNP in the last 8760 hours. Basic Metabolic Panel: Recent Labs  Lab 02/28/17 1219 03/01/17 0435 03/02/17 0542 03/03/17 0626 03/04/17 0531  NA 136 137 137 141 142  K 3.5 3.3* 3.3* 3.6 3.7  CL 113* 107 106 107 107  CO2 17* 24 25 26 25   GLUCOSE 107* 102* 103* 90 97  BUN 7 6 <5* <5* <5*  CREATININE 0.81 0.68 0.67 0.71 0.76  CALCIUM 7.5* 7.9* 8.2* 8.5* 8.8*  MG  --   --   --  1.7  --    Liver Function Tests: Recent Labs  Lab 03/03/17 0626  AST 26  ALT 62*  ALKPHOS 172*  BILITOT 0.8  PROT 5.3*  ALBUMIN 2.4*   No results for input(s): LIPASE, AMYLASE in the last 168 hours. No results for input(s): AMMONIA in the last 168 hours. CBC: Recent Labs  Lab 02/28/17 1219 02/28/17 2146 03/01/17 0435 03/03/17 0626 03/04/17 0531  WBC 2.2*  --  6.8 6.7 6.3  HGB 6.6* 7.7* 7.8* 8.4* 8.4*  HCT 19.3* 22.0* 22.4* 25.0* 25.0*  MCV 94.1  --  91.4 93.3 92.9  PLT 157  --  182 226 289   Cardiac Enzymes: No results for input(s): CKTOTAL, CKMB, CKMBINDEX, TROPONINI in the last 168 hours. BNP: Invalid input(s): POCBNP CBG: No results  for input(s): GLUCAP in the last 168  hours. D-Dimer No results for input(s): DDIMER in the last 72 hours. Hgb A1c No results for input(s): HGBA1C in the last 72 hours. Lipid Profile No results for input(s): CHOL, HDL, LDLCALC, TRIG, CHOLHDL, LDLDIRECT in the last 72 hours. Thyroid function studies No results for input(s): TSH, T4TOTAL, T3FREE, THYROIDAB in the last 72 hours.  Invalid input(s): FREET3 Anemia work up No results for input(s): VITAMINB12, FOLATE, FERRITIN, TIBC, IRON, RETICCTPCT in the last 72 hours. Urinalysis    Component Value Date/Time   COLORURINE YELLOW 02/28/2017 1154   APPEARANCEUR CLEAR 02/28/2017 1154   LABSPEC 1.009 02/28/2017 1154   PHURINE 6.0 02/28/2017 1154   GLUCOSEU NEGATIVE 02/28/2017 1154   GLUCOSEU NEGATIVE 06/30/2008 1100   HGBUR NEGATIVE 02/28/2017 1154   BILIRUBINUR NEGATIVE 02/28/2017 1154   KETONESUR NEGATIVE 02/28/2017 1154   PROTEINUR NEGATIVE 02/28/2017 1154   UROBILINOGEN 0.2 mg/dL 16/10/960403/07/2008 [id number]   NITRITE NEGATIVE 02/28/2017 1154   LEUKOCYTESUR NEGATIVE 02/28/2017 1154   Sepsis Labs Invalid input(s): PROCALCITONIN,  WBC,  LACTICIDVEN Microbiology Recent Results (from the past 240 hour(s))  Culture, blood (routine x 2)     Status: None (Preliminary result)   Collection Time: 02/28/17 12:10 PM  Result Value Ref Range Status   Specimen Description BLOOD LEFT HAND  Final   Special Requests IN PEDIATRIC BOTTLE Blood Culture adequate volume  Final   Culture NO GROWTH 3 DAYS  Final   Report Status PENDING  Incomplete  Culture, blood (routine x 2)     Status: None (Preliminary result)   Collection Time: 02/28/17 12:15 PM  Result Value Ref Range Status   Specimen Description BLOOD LEFT HAND  Final   Special Requests IN PEDIATRIC BOTTLE Blood Culture adequate volume  Final   Culture NO GROWTH 3 DAYS  Final   Report Status PENDING  Incomplete  Culture, expectorated sputum-assessment     Status: None   Collection Time: 03/01/17 11:14 AM  Result Value Ref Range Status    Specimen Description SPUTUM  Final   Special Requests NONE  Final   Sputum evaluation   Final    Sputum specimen not acceptable for testing.  Please recollect.   Gram Stain Report Called to,Read Back By and Verified With: J HAMZ,RN AT 1414 03/01/17 BY L BENFIELD    Report Status 03/01/2017 FINAL  Final     Time coordinating discharge: Over 30 minutes  SIGNED:   Alba Coryegalado, Konica Stankowski A, MD  [facility] 03/04/2017, 12:09 PM Pager   If 7PM-7AM, please contact night-coverage [email or web address] Password [email or web address]

## 2017-03-04 NOTE — Care Management Note (Signed)
Case Management Note  Patient Details  Name: Rolly PancakeRickie A Bartolome MRN: [id number] Date of Birth: [birth year]  Subjective/Objective:                    Action/Plan:   Expected Discharge Date:  03/04/17               Expected Discharge Plan:  Home w Home Health Services  In-House Referral:     Discharge planning Services  CM Consult  Post Acute Care Choice:  Durable Medical Equipment, Home Health Choice offered to:  Patient  DME Arranged:  [facility] DME Agency:  [facility]  HH [facility]  [facility]  Status of Service:  Completed, signed off  If discussed at Long Length of Stay Meetings, dates discussed:    Additional Comments:  Kingsley PlanWile, Azarah Dacy Marie, RN 03/04/2017, 1:24 PM

## 2017-03-04 NOTE — Progress Notes (Signed)
[name] discharged per MD order. Discussed with the patient and all questions fully answered.  VSS, Skin clean, dry and intact without evidence of skin break down, no evidence of skin tears noted.  IV catheter discontinued intact. Site without signs and symptoms of complications. Dressing and pressure applied.  An After Visit Summary was printed and given to the patient. Patient received prescription.  Discharge education completed with patient including follow up instructions, medication list, d/c activities limitations if indicated, with other d/c instructions as indicated by MD - patient able to verbalize understanding, all questions fully answered.   Patient instructed to return to ED, call 911, or call MD for any changes in condition.   Patient to be escorted via WC, and D/C home via private auto.

## 2017-03-05 LAB — CULTURE, BLOOD (ROUTINE X 2)
Culture: NO GROWTH
Culture: NO GROWTH
SPECIAL REQUESTS: ADEQUATE
SPECIAL REQUESTS: ADEQUATE

## 2017-03-07 DIAGNOSIS — Z7951 Long term (current) use of inhaled steroids: Secondary | ICD-10-CM | POA: Diagnosis not present

## 2017-03-07 DIAGNOSIS — M1990 Unspecified osteoarthritis, unspecified site: Secondary | ICD-10-CM | POA: Diagnosis not present

## 2017-03-07 DIAGNOSIS — Z981 Arthrodesis status: Secondary | ICD-10-CM | POA: Diagnosis not present

## 2017-03-07 DIAGNOSIS — Z7982 Long term (current) use of aspirin: Secondary | ICD-10-CM | POA: Diagnosis not present

## 2017-03-07 DIAGNOSIS — M545 Low back pain: Secondary | ICD-10-CM | POA: Diagnosis not present

## 2017-03-07 DIAGNOSIS — J440 Chronic obstructive pulmonary disease with acute lower respiratory infection: Secondary | ICD-10-CM | POA: Diagnosis not present

## 2017-03-07 DIAGNOSIS — Z969 Presence of functional implant, unspecified: Secondary | ICD-10-CM | POA: Diagnosis not present

## 2017-03-07 DIAGNOSIS — G8929 Other chronic pain: Secondary | ICD-10-CM | POA: Diagnosis not present

## 2017-03-07 DIAGNOSIS — Z87891 Personal history of nicotine dependence: Secondary | ICD-10-CM | POA: Diagnosis not present

## 2017-03-07 DIAGNOSIS — D649 Anemia, unspecified: Secondary | ICD-10-CM | POA: Diagnosis not present

## 2017-03-07 DIAGNOSIS — J189 Pneumonia, unspecified organism: Secondary | ICD-10-CM | POA: Diagnosis not present

## 2017-03-07 DIAGNOSIS — I10 Essential (primary) hypertension: Secondary | ICD-10-CM | POA: Diagnosis not present

## 2017-03-07 DIAGNOSIS — K219 Gastro-esophageal reflux disease without esophagitis: Secondary | ICD-10-CM | POA: Diagnosis not present

## 2017-03-10 ENCOUNTER — Other Ambulatory Visit: Payer: Self-pay | Admitting: *Deleted

## 2017-03-10 ENCOUNTER — Encounter: Payer: Self-pay | Admitting: *Deleted

## 2017-03-10 NOTE — Patient Outreach (Signed)
[facility]) Care Management  03/10/2017  Courtney PancakeRickie A Kotecki [birth year] [id number]  EMMI- Pneumonia RED ON EMMI ALERT DAY#: 3 DATE: 03/09/17 RED ALERT: Feeling better overall? No   Outreach attempt #1 to patient. HIPAA verified with patient. Patient stated, "The EMMI automated questionnaire calls are annoying". Patient confirmed, "She is not feeling better related to the treatment received by the hospital staff". Patient stated, she needs to speak with someone in Administration to voice her concerns. She believes her healing process will begin after she informs Administration how she was treated during her hospital stay. Patient stated, "Her nurse was very rude". "The nurse pulled off a dressing covering a 9 inch surgical incision, without any sympathy". Patient stated, "There was not enough staff on the unit to assist in patient care". Patient believes her care was good during her first hospital stay from 02/24/17 to 02/26/17. Patient had to return to the hospital by ambulance a second time for HCAP and was admitted again from 02/28/17 to 03/04/17. Patient stated, "The staff treated her well in ICU until she transferred to the unit". Patient explained she preferred to go home instead of staying in the hospital another day. Patient voiced, she was not contacted by [facility] after being discharged from the hospital. She verbalized having to contact her primary MD's office to get home health arranged. RN CM informed patient about notifying [facility] regarding her concerns and patient agreed. Patient stated, she had to hang up the phone and she thanked RN CM for listening to her concerns.  Patient had hospital discharge follow-up appointment with Surgeon on 03/07/17.     Plan: RN CM will contact Patient Experience regarding patient's concerns. RN CM [facility] regarding case closure.  RM CM will send successful outreach  letter to patient.  Update @ 1228: RN CM contacted Patient Experience and left a voicemail for a return phone call.  Update @ 1522: Incoming call from Jasimine with [facility]. RN CM provided Jasmine with patient's name. Jasmine plans to follow-up with patient's concerns.   Wynelle ClevelandJuanita Harlis Champoux, RN, BSN, MHA/MSL, [facility] Telephonic Care Manager Coordinator [facility] Direct Phone: [phone] Toll Free: [phone] Fax: [phone]

## 2017-03-11 ENCOUNTER — Other Ambulatory Visit: Payer: Self-pay | Admitting: *Deleted

## 2017-03-11 NOTE — Patient Outreach (Signed)
[facility]) Care Management  03/11/2017  Courtney PancakeRickie A Turner 07/06/1950 [id number]   EMMI- Pneumonia RED ON EMMI ALERT DAY#: 4 DATE: 03/10/17 RED ALERT: Feeling better overall? No Sleeping better than when in hospital? No  Outreach telephone to patient. HIPAA verified with patient. RN CM informed patient about relaying her concerns to [facility], successfully. Patient stated, she appreciated the follow-up call. Patient reported, she is struggling today. She reported having to cancel a Pulmonology appointment today (03/11/17) due to pain. Patient stated, the doctor readjusted her Percocet to one tab every 4 hours. Patient stated, she is applying ice to assist with decreasing her pain. Patient stated, she had someone coming to assist her and she had to hang up the phone.   Plan: RN CM will notify [facility] administrative assistant regarding case closure.   Wynelle ClevelandJuanita Menna Abeln, RN, BSN, MHA/MSL, [facility] Telephonic Care Manager Coordinator [facility] Direct Phone: [phone] Toll Free: [phone] Fax: [phone]

## 2017-03-13 DIAGNOSIS — D649 Anemia, unspecified: Secondary | ICD-10-CM | POA: Diagnosis not present

## 2017-03-13 DIAGNOSIS — Z7951 Long term (current) use of inhaled steroids: Secondary | ICD-10-CM | POA: Diagnosis not present

## 2017-03-13 DIAGNOSIS — K219 Gastro-esophageal reflux disease without esophagitis: Secondary | ICD-10-CM | POA: Diagnosis not present

## 2017-03-13 DIAGNOSIS — J440 Chronic obstructive pulmonary disease with acute lower respiratory infection: Secondary | ICD-10-CM | POA: Diagnosis not present

## 2017-03-13 DIAGNOSIS — Z7982 Long term (current) use of aspirin: Secondary | ICD-10-CM | POA: Diagnosis not present

## 2017-03-13 DIAGNOSIS — M545 Low back pain: Secondary | ICD-10-CM | POA: Diagnosis not present

## 2017-03-13 DIAGNOSIS — Z969 Presence of functional implant, unspecified: Secondary | ICD-10-CM | POA: Diagnosis not present

## 2017-03-13 DIAGNOSIS — Z87891 Personal history of nicotine dependence: Secondary | ICD-10-CM | POA: Diagnosis not present

## 2017-03-13 DIAGNOSIS — J189 Pneumonia, unspecified organism: Secondary | ICD-10-CM | POA: Diagnosis not present

## 2017-03-13 DIAGNOSIS — G8929 Other chronic pain: Secondary | ICD-10-CM | POA: Diagnosis not present

## 2017-03-13 DIAGNOSIS — M1990 Unspecified osteoarthritis, unspecified site: Secondary | ICD-10-CM | POA: Diagnosis not present

## 2017-03-13 DIAGNOSIS — I10 Essential (primary) hypertension: Secondary | ICD-10-CM | POA: Diagnosis not present

## 2017-03-13 DIAGNOSIS — Z981 Arthrodesis status: Secondary | ICD-10-CM | POA: Diagnosis not present

## 2017-03-14 DIAGNOSIS — Z6821 Body mass index (BMI) 21.0-21.9, adult: Secondary | ICD-10-CM | POA: Diagnosis not present

## 2017-03-14 DIAGNOSIS — G629 Polyneuropathy, unspecified: Secondary | ICD-10-CM | POA: Diagnosis not present

## 2017-03-14 DIAGNOSIS — J181 Lobar pneumonia, unspecified organism: Secondary | ICD-10-CM | POA: Diagnosis not present

## 2017-03-14 DIAGNOSIS — M4326 Fusion of spine, lumbar region: Secondary | ICD-10-CM | POA: Diagnosis not present

## 2017-03-14 DIAGNOSIS — M47816 Spondylosis without myelopathy or radiculopathy, lumbar region: Secondary | ICD-10-CM | POA: Diagnosis not present

## 2017-03-17 ENCOUNTER — Other Ambulatory Visit: Payer: Self-pay | Admitting: *Deleted

## 2017-03-17 NOTE — Patient Outreach (Signed)
[facility]) Care Management  03/17/2017  Courtney PancakeRickie A Turner 07-27-1950 [id number]  EMMI- Pneumonia RED ON EMMI ALERT DAY#: 9 DATE: 03/09/17 RED ALERT: Feeling better overall? No More short of breath than yesterday? Yes  Outreach telephone call to patient. HIPAA verified with patient. Patient stated, "She wants the EMMI automated phone calls stopped". She only [facility] to [facility] to [name]. "Patient doesn't know why we are wasting money on EMMI automated phone calls". She believes "the money could be used for hiring more staff members to work on the unit". Patient ended telephone call before addressing EMMI questions/concerns.  Plan: RN CM will notify [facility] administrative assistant regarding case closure and to end EMMI automated telephone calls.   Wynelle ClevelandJuanita Kalynn Declercq, RN, BSN, MHA/MSL, [facility] Telephonic Care Manager Coordinator [facility] Direct Phone: [phone] Toll Free: [phone] Fax: [phone]

## 2017-03-28 DIAGNOSIS — M4310 Spondylolisthesis, site unspecified: Secondary | ICD-10-CM | POA: Diagnosis not present

## 2017-05-07 DIAGNOSIS — M4310 Spondylolisthesis, site unspecified: Secondary | ICD-10-CM | POA: Diagnosis not present

## 2017-05-14 DIAGNOSIS — J440 Chronic obstructive pulmonary disease with acute lower respiratory infection: Secondary | ICD-10-CM | POA: Diagnosis not present

## 2017-05-14 DIAGNOSIS — J181 Lobar pneumonia, unspecified organism: Secondary | ICD-10-CM | POA: Diagnosis not present

## 2017-05-14 DIAGNOSIS — J159 Unspecified bacterial pneumonia: Secondary | ICD-10-CM | POA: Diagnosis not present

## 2017-05-14 DIAGNOSIS — Z87891 Personal history of nicotine dependence: Secondary | ICD-10-CM | POA: Diagnosis not present

## 2017-05-15 DIAGNOSIS — J181 Lobar pneumonia, unspecified organism: Secondary | ICD-10-CM | POA: Diagnosis not present

## 2017-05-22 DIAGNOSIS — J180 Bronchopneumonia, unspecified organism: Secondary | ICD-10-CM | POA: Diagnosis not present

## 2017-05-22 DIAGNOSIS — Z6822 Body mass index (BMI) 22.0-22.9, adult: Secondary | ICD-10-CM | POA: Diagnosis not present

## 2017-05-22 DIAGNOSIS — Z09 Encounter for follow-up examination after completed treatment for conditions other than malignant neoplasm: Secondary | ICD-10-CM | POA: Diagnosis not present

## 2017-05-25 DIAGNOSIS — J189 Pneumonia, unspecified organism: Secondary | ICD-10-CM | POA: Diagnosis not present

## 2017-05-25 DIAGNOSIS — I959 Hypotension, unspecified: Secondary | ICD-10-CM | POA: Diagnosis not present

## 2017-05-25 DIAGNOSIS — Z87891 Personal history of nicotine dependence: Secondary | ICD-10-CM | POA: Diagnosis not present

## 2017-05-25 DIAGNOSIS — R0902 Hypoxemia: Secondary | ICD-10-CM | POA: Diagnosis not present

## 2017-05-25 DIAGNOSIS — R0602 Shortness of breath: Secondary | ICD-10-CM | POA: Diagnosis not present

## 2017-05-25 DIAGNOSIS — J440 Chronic obstructive pulmonary disease with acute lower respiratory infection: Secondary | ICD-10-CM | POA: Diagnosis not present

## 2017-05-28 DIAGNOSIS — Z6823 Body mass index (BMI) 23.0-23.9, adult: Secondary | ICD-10-CM | POA: Diagnosis not present

## 2017-05-28 DIAGNOSIS — J181 Lobar pneumonia, unspecified organism: Secondary | ICD-10-CM | POA: Diagnosis not present

## 2017-06-19 DIAGNOSIS — M4310 Spondylolisthesis, site unspecified: Secondary | ICD-10-CM | POA: Diagnosis not present

## 2017-06-24 DIAGNOSIS — F339 Major depressive disorder, recurrent, unspecified: Secondary | ICD-10-CM | POA: Diagnosis not present

## 2017-07-03 DIAGNOSIS — E039 Hypothyroidism, unspecified: Secondary | ICD-10-CM | POA: Diagnosis not present

## 2017-07-03 DIAGNOSIS — Z79891 Long term (current) use of opiate analgesic: Secondary | ICD-10-CM | POA: Diagnosis not present

## 2017-07-03 DIAGNOSIS — E559 Vitamin D deficiency, unspecified: Secondary | ICD-10-CM | POA: Diagnosis not present

## 2017-07-03 DIAGNOSIS — M47816 Spondylosis without myelopathy or radiculopathy, lumbar region: Secondary | ICD-10-CM | POA: Diagnosis not present

## 2017-07-03 DIAGNOSIS — M159 Polyosteoarthritis, unspecified: Secondary | ICD-10-CM | POA: Diagnosis not present

## 2017-07-03 DIAGNOSIS — G629 Polyneuropathy, unspecified: Secondary | ICD-10-CM | POA: Diagnosis not present

## 2017-07-03 DIAGNOSIS — Z6823 Body mass index (BMI) 23.0-23.9, adult: Secondary | ICD-10-CM | POA: Diagnosis not present

## 2017-07-03 DIAGNOSIS — J449 Chronic obstructive pulmonary disease, unspecified: Secondary | ICD-10-CM | POA: Diagnosis not present

## 2017-08-09 DIAGNOSIS — J189 Pneumonia, unspecified organism: Secondary | ICD-10-CM | POA: Diagnosis not present

## 2017-08-09 DIAGNOSIS — Z9119 Patient's noncompliance with other medical treatment and regimen: Secondary | ICD-10-CM | POA: Diagnosis not present

## 2017-08-09 DIAGNOSIS — A419 Sepsis, unspecified organism: Secondary | ICD-10-CM | POA: Diagnosis not present

## 2017-08-09 DIAGNOSIS — I10 Essential (primary) hypertension: Secondary | ICD-10-CM | POA: Diagnosis present

## 2017-08-09 DIAGNOSIS — Z87891 Personal history of nicotine dependence: Secondary | ICD-10-CM | POA: Diagnosis not present

## 2017-08-09 DIAGNOSIS — J440 Chronic obstructive pulmonary disease with acute lower respiratory infection: Secondary | ICD-10-CM | POA: Diagnosis not present

## 2017-08-09 DIAGNOSIS — J441 Chronic obstructive pulmonary disease with (acute) exacerbation: Secondary | ICD-10-CM | POA: Diagnosis present

## 2017-08-09 DIAGNOSIS — E039 Hypothyroidism, unspecified: Secondary | ICD-10-CM | POA: Diagnosis present

## 2017-08-09 DIAGNOSIS — Z79891 Long term (current) use of opiate analgesic: Secondary | ICD-10-CM | POA: Diagnosis not present

## 2017-08-09 DIAGNOSIS — Z5321 Procedure and treatment not carried out due to patient leaving prior to being seen by health care provider: Secondary | ICD-10-CM | POA: Diagnosis present

## 2017-08-09 DIAGNOSIS — R05 Cough: Secondary | ICD-10-CM | POA: Diagnosis not present

## 2017-08-09 DIAGNOSIS — G894 Chronic pain syndrome: Secondary | ICD-10-CM | POA: Diagnosis present

## 2017-08-09 DIAGNOSIS — F418 Other specified anxiety disorders: Secondary | ICD-10-CM | POA: Diagnosis present

## 2017-08-09 DIAGNOSIS — R0602 Shortness of breath: Secondary | ICD-10-CM | POA: Diagnosis not present

## 2017-08-09 DIAGNOSIS — Z79899 Other long term (current) drug therapy: Secondary | ICD-10-CM | POA: Diagnosis not present

## 2017-08-09 DIAGNOSIS — E872 Acidosis: Secondary | ICD-10-CM | POA: Diagnosis not present

## 2017-08-09 DIAGNOSIS — E871 Hypo-osmolality and hyponatremia: Secondary | ICD-10-CM | POA: Diagnosis not present

## 2017-08-09 DIAGNOSIS — J9621 Acute and chronic respiratory failure with hypoxia: Secondary | ICD-10-CM | POA: Diagnosis present

## 2017-08-12 DIAGNOSIS — Z09 Encounter for follow-up examination after completed treatment for conditions other than malignant neoplasm: Secondary | ICD-10-CM | POA: Diagnosis not present

## 2017-08-12 DIAGNOSIS — M159 Polyosteoarthritis, unspecified: Secondary | ICD-10-CM | POA: Diagnosis not present

## 2017-08-12 DIAGNOSIS — Z6823 Body mass index (BMI) 23.0-23.9, adult: Secondary | ICD-10-CM | POA: Diagnosis not present

## 2017-08-12 DIAGNOSIS — J180 Bronchopneumonia, unspecified organism: Secondary | ICD-10-CM | POA: Diagnosis not present

## 2017-08-19 DIAGNOSIS — R918 Other nonspecific abnormal finding of lung field: Secondary | ICD-10-CM | POA: Diagnosis not present

## 2017-08-19 DIAGNOSIS — J180 Bronchopneumonia, unspecified organism: Secondary | ICD-10-CM | POA: Diagnosis not present

## 2017-08-19 DIAGNOSIS — R0602 Shortness of breath: Secondary | ICD-10-CM | POA: Diagnosis not present

## 2017-08-19 DIAGNOSIS — R05 Cough: Secondary | ICD-10-CM | POA: Diagnosis not present

## 2017-08-19 DIAGNOSIS — Z6824 Body mass index (BMI) 24.0-24.9, adult: Secondary | ICD-10-CM | POA: Diagnosis not present

## 2017-08-20 DIAGNOSIS — M4310 Spondylolisthesis, site unspecified: Secondary | ICD-10-CM | POA: Diagnosis not present

## 2017-09-23 DIAGNOSIS — F064 Anxiety disorder due to known physiological condition: Secondary | ICD-10-CM | POA: Diagnosis not present

## 2017-10-28 DIAGNOSIS — M47816 Spondylosis without myelopathy or radiculopathy, lumbar region: Secondary | ICD-10-CM | POA: Diagnosis not present

## 2017-10-28 DIAGNOSIS — J449 Chronic obstructive pulmonary disease, unspecified: Secondary | ICD-10-CM | POA: Diagnosis not present

## 2017-10-28 DIAGNOSIS — G629 Polyneuropathy, unspecified: Secondary | ICD-10-CM | POA: Diagnosis not present

## 2017-10-28 DIAGNOSIS — M159 Polyosteoarthritis, unspecified: Secondary | ICD-10-CM | POA: Diagnosis not present

## 2017-11-13 ENCOUNTER — Other Ambulatory Visit: Payer: Self-pay | Admitting: Neurosurgery

## 2017-11-13 DIAGNOSIS — M4310 Spondylolisthesis, site unspecified: Secondary | ICD-10-CM

## 2017-11-24 ENCOUNTER — Ambulatory Visit
Admission: RE | Admit: 2017-11-24 | Discharge: 2017-11-24 | Disposition: A | Discharge status: Home / Self Care | Payer: Medicare Other | Source: Ambulatory Visit | Attending: Neurosurgery | Admitting: Neurosurgery

## 2017-11-24 DIAGNOSIS — M4310 Spondylolisthesis, site unspecified: Secondary | ICD-10-CM

## 2017-11-24 DIAGNOSIS — M47816 Spondylosis without myelopathy or radiculopathy, lumbar region: Secondary | ICD-10-CM | POA: Diagnosis not present

## 2017-11-24 MED ORDER — GADOBENATE DIMEGLUMINE 529 MG/ML IV SOLN
12.0000 mL | Freq: Once | INTRAVENOUS | Status: AC | PRN
  Administered 2017-11-24: 12 mL via INTRAVENOUS

## 2017-11-27 DIAGNOSIS — M4310 Spondylolisthesis, site unspecified: Secondary | ICD-10-CM | POA: Diagnosis not present

## 2017-12-16 DIAGNOSIS — F064 Anxiety disorder due to known physiological condition: Secondary | ICD-10-CM | POA: Diagnosis not present

## 2018-01-19 DIAGNOSIS — F339 Major depressive disorder, recurrent, unspecified: Secondary | ICD-10-CM | POA: Diagnosis not present

## 2018-01-28 DIAGNOSIS — M47816 Spondylosis without myelopathy or radiculopathy, lumbar region: Secondary | ICD-10-CM | POA: Diagnosis not present

## 2018-01-28 DIAGNOSIS — E039 Hypothyroidism, unspecified: Secondary | ICD-10-CM | POA: Diagnosis not present

## 2018-01-28 DIAGNOSIS — J449 Chronic obstructive pulmonary disease, unspecified: Secondary | ICD-10-CM | POA: Diagnosis not present

## 2018-01-28 DIAGNOSIS — G629 Polyneuropathy, unspecified: Secondary | ICD-10-CM | POA: Diagnosis not present

## 2018-01-28 DIAGNOSIS — Z23 Encounter for immunization: Secondary | ICD-10-CM | POA: Diagnosis not present

## 2018-01-28 DIAGNOSIS — Z1331 Encounter for screening for depression: Secondary | ICD-10-CM | POA: Diagnosis not present

## 2018-02-02 DIAGNOSIS — F064 Anxiety disorder due to known physiological condition: Secondary | ICD-10-CM | POA: Diagnosis not present

## 2018-02-02 DIAGNOSIS — Z6824 Body mass index (BMI) 24.0-24.9, adult: Secondary | ICD-10-CM | POA: Diagnosis not present

## 2018-02-02 DIAGNOSIS — J441 Chronic obstructive pulmonary disease with (acute) exacerbation: Secondary | ICD-10-CM | POA: Diagnosis not present

## 2018-02-18 DIAGNOSIS — H2513 Age-related nuclear cataract, bilateral: Secondary | ICD-10-CM | POA: Diagnosis not present

## 2018-02-23 DIAGNOSIS — F339 Major depressive disorder, recurrent, unspecified: Secondary | ICD-10-CM | POA: Diagnosis not present

## 2018-04-06 DIAGNOSIS — F339 Major depressive disorder, recurrent, unspecified: Secondary | ICD-10-CM | POA: Diagnosis not present

## 2018-05-13 DIAGNOSIS — I10 Essential (primary) hypertension: Secondary | ICD-10-CM | POA: Diagnosis not present

## 2018-05-13 DIAGNOSIS — M4310 Spondylolisthesis, site unspecified: Secondary | ICD-10-CM | POA: Diagnosis not present

## 2018-06-04 DIAGNOSIS — F064 Anxiety disorder due to known physiological condition: Secondary | ICD-10-CM | POA: Diagnosis not present

## 2018-06-04 DIAGNOSIS — G629 Polyneuropathy, unspecified: Secondary | ICD-10-CM | POA: Diagnosis not present

## 2018-06-04 DIAGNOSIS — M47816 Spondylosis without myelopathy or radiculopathy, lumbar region: Secondary | ICD-10-CM | POA: Diagnosis not present

## 2018-06-04 DIAGNOSIS — J449 Chronic obstructive pulmonary disease, unspecified: Secondary | ICD-10-CM | POA: Diagnosis not present

## 2018-06-04 DIAGNOSIS — Z9181 History of falling: Secondary | ICD-10-CM | POA: Diagnosis not present

## 2018-06-19 DIAGNOSIS — Z6824 Body mass index (BMI) 24.0-24.9, adult: Secondary | ICD-10-CM | POA: Diagnosis not present

## 2018-06-19 DIAGNOSIS — J441 Chronic obstructive pulmonary disease with (acute) exacerbation: Secondary | ICD-10-CM | POA: Diagnosis not present

## 2018-07-02 DIAGNOSIS — J441 Chronic obstructive pulmonary disease with (acute) exacerbation: Secondary | ICD-10-CM | POA: Diagnosis not present

## 2018-07-02 DIAGNOSIS — Z6824 Body mass index (BMI) 24.0-24.9, adult: Secondary | ICD-10-CM | POA: Diagnosis not present

## 2018-07-03 DIAGNOSIS — J449 Chronic obstructive pulmonary disease, unspecified: Secondary | ICD-10-CM | POA: Diagnosis not present

## 2018-07-03 DIAGNOSIS — I10 Essential (primary) hypertension: Secondary | ICD-10-CM | POA: Diagnosis not present

## 2018-07-03 DIAGNOSIS — Z79899 Other long term (current) drug therapy: Secondary | ICD-10-CM | POA: Diagnosis not present

## 2018-07-03 DIAGNOSIS — J189 Pneumonia, unspecified organism: Secondary | ICD-10-CM | POA: Diagnosis not present

## 2018-07-03 DIAGNOSIS — Z79891 Long term (current) use of opiate analgesic: Secondary | ICD-10-CM | POA: Diagnosis not present

## 2018-07-03 DIAGNOSIS — R05 Cough: Secondary | ICD-10-CM | POA: Diagnosis not present

## 2018-07-03 DIAGNOSIS — K219 Gastro-esophageal reflux disease without esophagitis: Secondary | ICD-10-CM | POA: Diagnosis not present

## 2018-07-03 DIAGNOSIS — G894 Chronic pain syndrome: Secondary | ICD-10-CM | POA: Diagnosis not present

## 2018-07-03 DIAGNOSIS — J440 Chronic obstructive pulmonary disease with acute lower respiratory infection: Secondary | ICD-10-CM | POA: Diagnosis not present

## 2018-07-03 DIAGNOSIS — J181 Lobar pneumonia, unspecified organism: Secondary | ICD-10-CM | POA: Diagnosis not present

## 2018-07-03 DIAGNOSIS — E039 Hypothyroidism, unspecified: Secondary | ICD-10-CM | POA: Diagnosis not present

## 2018-07-13 DIAGNOSIS — F339 Major depressive disorder, recurrent, unspecified: Secondary | ICD-10-CM | POA: Diagnosis not present

## 2018-09-02 DIAGNOSIS — S0990XA Unspecified injury of head, initial encounter: Secondary | ICD-10-CM | POA: Diagnosis not present

## 2018-09-02 DIAGNOSIS — S199XXA Unspecified injury of neck, initial encounter: Secondary | ICD-10-CM | POA: Diagnosis not present

## 2018-09-02 DIAGNOSIS — Z1331 Encounter for screening for depression: Secondary | ICD-10-CM | POA: Diagnosis not present

## 2018-09-02 DIAGNOSIS — G629 Polyneuropathy, unspecified: Secondary | ICD-10-CM | POA: Diagnosis not present

## 2018-09-02 DIAGNOSIS — Z9181 History of falling: Secondary | ICD-10-CM | POA: Diagnosis not present

## 2018-09-02 DIAGNOSIS — M47816 Spondylosis without myelopathy or radiculopathy, lumbar region: Secondary | ICD-10-CM | POA: Diagnosis not present

## 2018-09-02 DIAGNOSIS — Z0000 Encounter for general adult medical examination without abnormal findings: Secondary | ICD-10-CM | POA: Diagnosis not present

## 2018-09-02 DIAGNOSIS — S0003XA Contusion of scalp, initial encounter: Secondary | ICD-10-CM | POA: Diagnosis not present

## 2018-09-02 DIAGNOSIS — J449 Chronic obstructive pulmonary disease, unspecified: Secondary | ICD-10-CM | POA: Diagnosis not present

## 2018-09-02 DIAGNOSIS — F064 Anxiety disorder due to known physiological condition: Secondary | ICD-10-CM | POA: Diagnosis not present

## 2018-09-02 DIAGNOSIS — S0101XA Laceration without foreign body of scalp, initial encounter: Secondary | ICD-10-CM | POA: Diagnosis not present

## 2018-09-07 DIAGNOSIS — F339 Major depressive disorder, recurrent, unspecified: Secondary | ICD-10-CM | POA: Diagnosis not present

## 2018-10-23 DIAGNOSIS — F064 Anxiety disorder due to known physiological condition: Secondary | ICD-10-CM | POA: Diagnosis not present

## 2018-10-23 DIAGNOSIS — J449 Chronic obstructive pulmonary disease, unspecified: Secondary | ICD-10-CM | POA: Diagnosis not present

## 2018-10-23 DIAGNOSIS — M47816 Spondylosis without myelopathy or radiculopathy, lumbar region: Secondary | ICD-10-CM | POA: Diagnosis not present

## 2018-10-23 DIAGNOSIS — Z13220 Encounter for screening for lipoid disorders: Secondary | ICD-10-CM | POA: Diagnosis not present

## 2018-10-23 DIAGNOSIS — Z79899 Other long term (current) drug therapy: Secondary | ICD-10-CM | POA: Diagnosis not present

## 2018-10-23 DIAGNOSIS — G629 Polyneuropathy, unspecified: Secondary | ICD-10-CM | POA: Diagnosis not present

## 2018-10-23 DIAGNOSIS — E039 Hypothyroidism, unspecified: Secondary | ICD-10-CM | POA: Diagnosis not present

## 2018-11-26 DIAGNOSIS — E875 Hyperkalemia: Secondary | ICD-10-CM | POA: Diagnosis not present

## 2019-01-08 DIAGNOSIS — F064 Anxiety disorder due to known physiological condition: Secondary | ICD-10-CM | POA: Diagnosis not present

## 2019-01-08 DIAGNOSIS — Z23 Encounter for immunization: Secondary | ICD-10-CM | POA: Diagnosis not present

## 2019-01-08 DIAGNOSIS — M47816 Spondylosis without myelopathy or radiculopathy, lumbar region: Secondary | ICD-10-CM | POA: Diagnosis not present

## 2019-01-08 DIAGNOSIS — G629 Polyneuropathy, unspecified: Secondary | ICD-10-CM | POA: Diagnosis not present

## 2019-01-08 DIAGNOSIS — J449 Chronic obstructive pulmonary disease, unspecified: Secondary | ICD-10-CM | POA: Diagnosis not present

## 2019-01-11 IMAGING — MR MR LUMBAR SPINE W/O CM
4 of 5 series · 24 of 48 positions shown · non-contrast
Comparison: Lumbar radiographs 10/29/2016, lumbar MRI 10/12/2012,
CT Abdomen and Pelvis 01/18/2008

CLINICAL DATA: 65-year-old female with lumbar back pain radiating
to the left hip and buttock for 2 years. Prior surgery in [DATE]
steroid injection without relief.

EXAM:
MRI LUMBAR SPINE WITHOUT CONTRAST
TECHNIQUE: Multiplanar, multisequence MR imaging of the lumbar spine was
performed. No intravenous contrast was administered.

[Series 5: T2 · sagittal · 4.0mm · 0.51mm/px · 6 of 13 slices shown (1 of 2)]
[im 1/13]
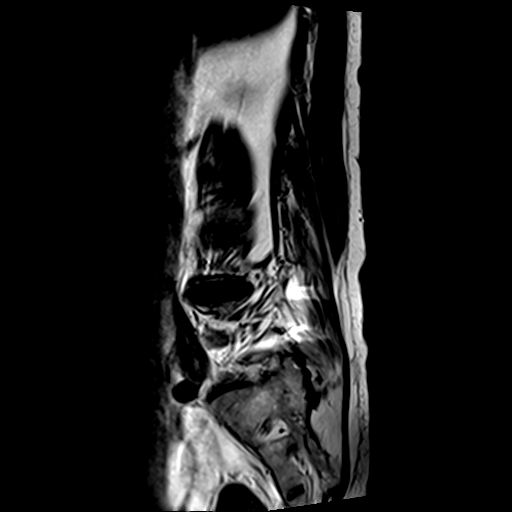
[im 3/13]
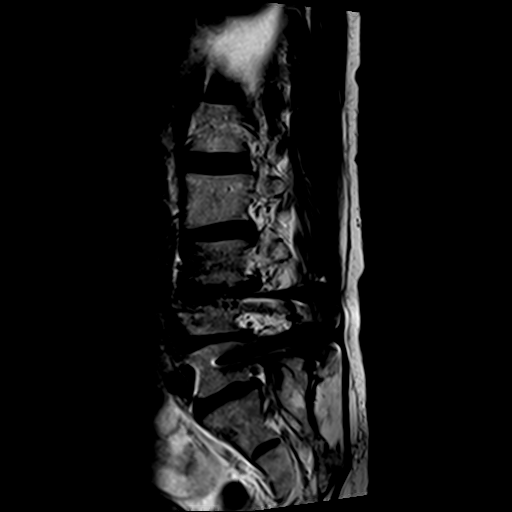
[im 5/13]
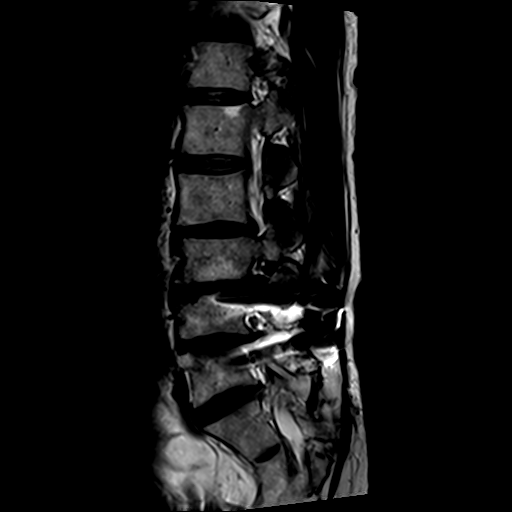
[im 8/13]
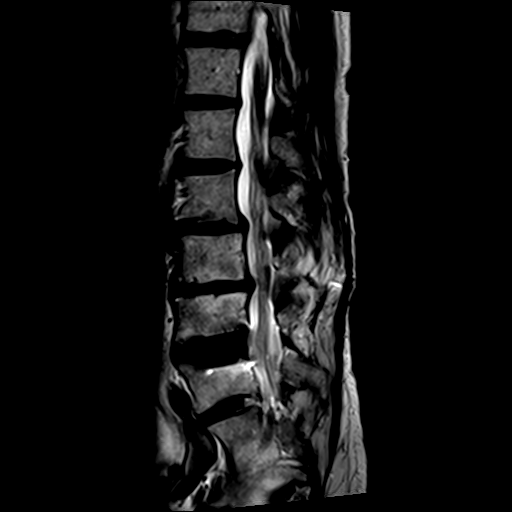
[im 10/13]
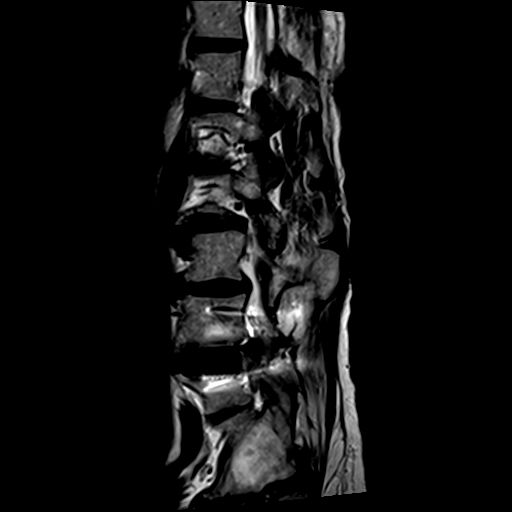
[im 13/13]
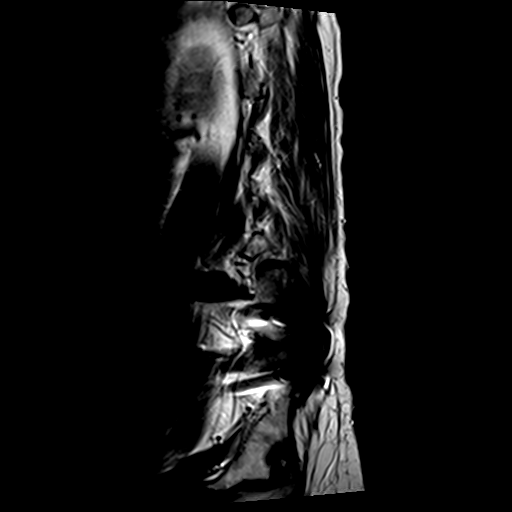

[Series 6: T1 · sagittal · 4.0mm · 0.51mm/px · 6 of 13 slices shown (1 of 2)]
[im 1/13]
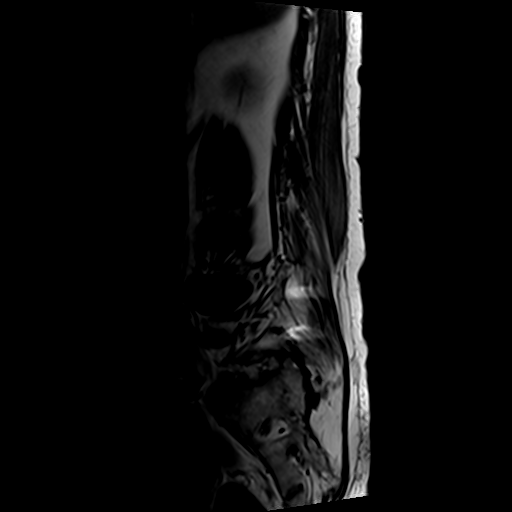
[im 3/13]
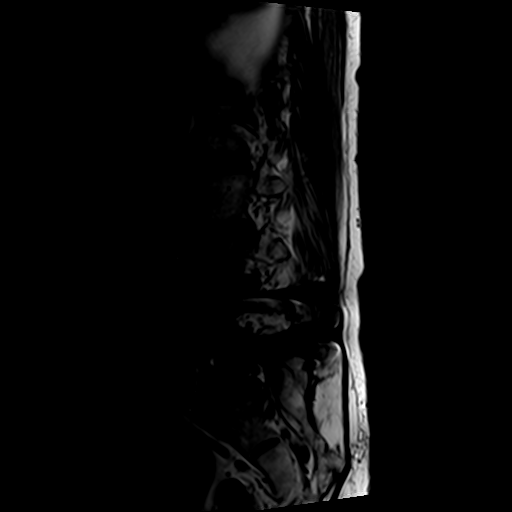
[im 5/13]
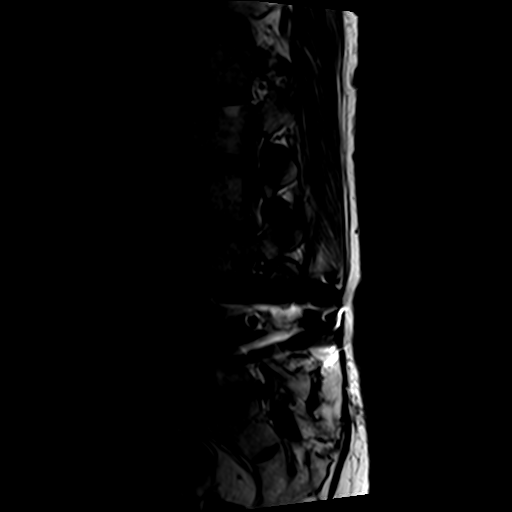
[im 8/13]
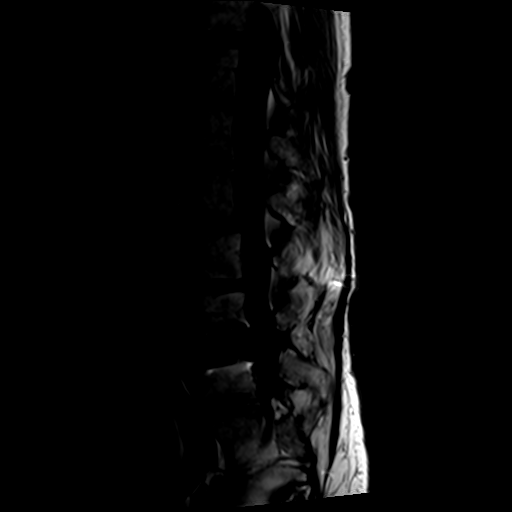
[im 10/13]
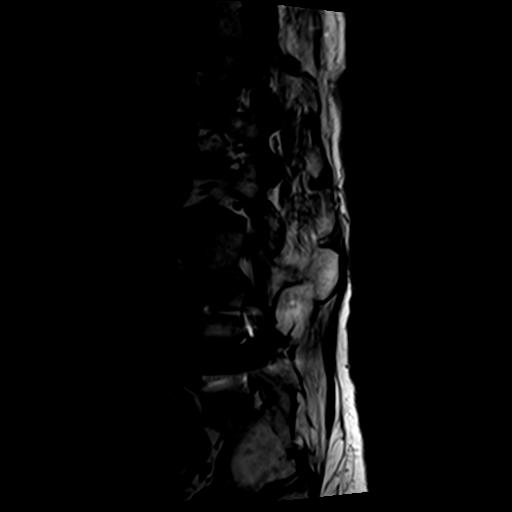
[im 13/13]
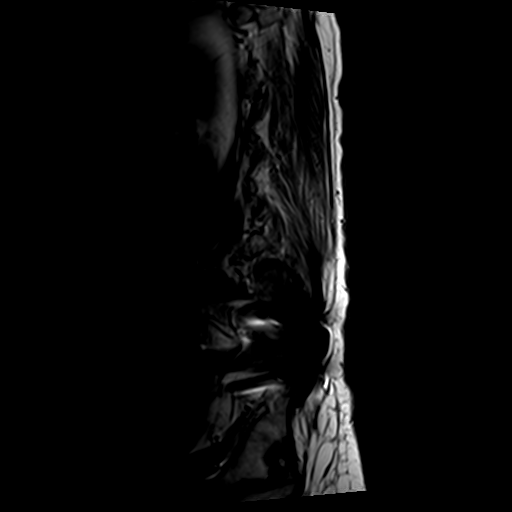

[Series 7: T1 · axial · 4.0mm · 0.37mm/px · z∈[-111,-8]mm · 3 of 30 slices shown (2 of 2)]
[im 5/30]
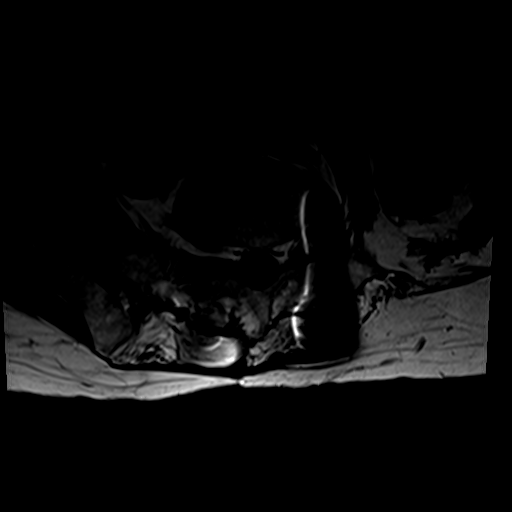
[im 15/30]
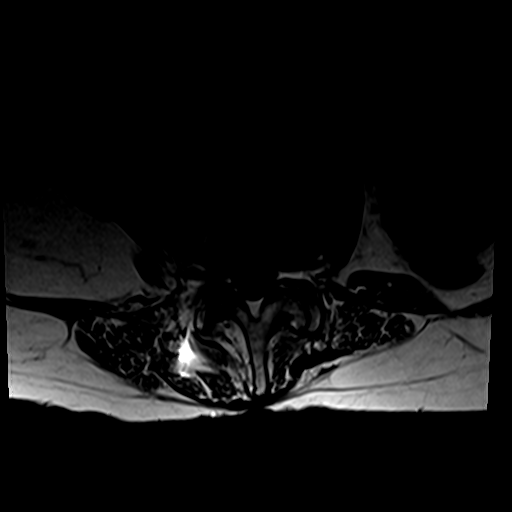
[im 25/30]
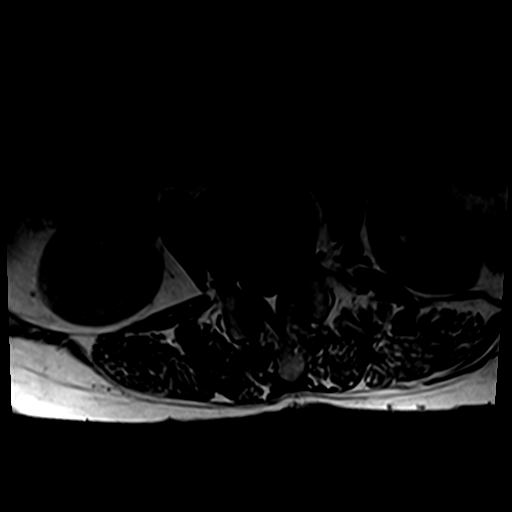

[Series 9: T2 · axial · 4.0mm · 0.74mm/px · z∈[-130,+84]mm · 9 of 30 slices shown (2 of 2)]
[im 1/30]
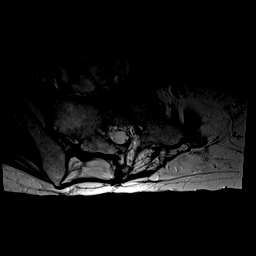
[im 5/30]
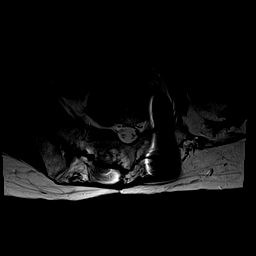
[im 9/30]
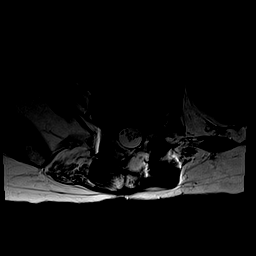
[im 13/30]
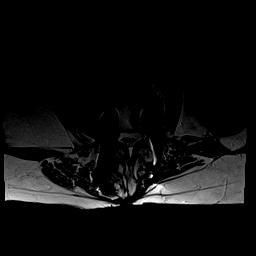
[im 15/30]
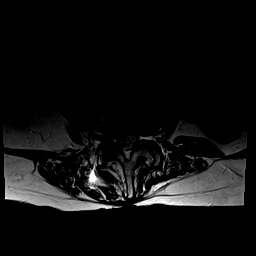
[im 17/30]
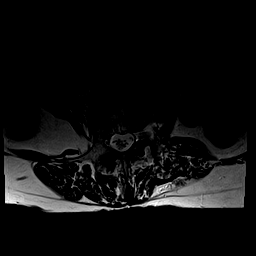
[im 21/30]
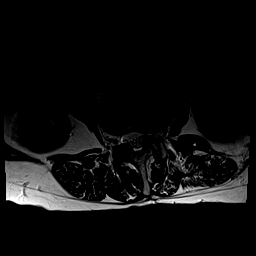
[im 25/30]
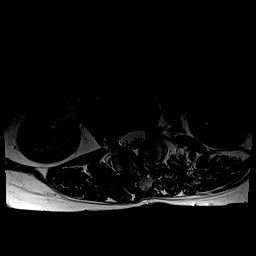
[im 30/30]
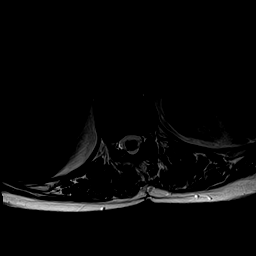

[24 of 48 positions shown; findings below may reference images not displayed]

FINDINGS: Segmentation: Comparison radiographs in CTs suggest full size ribs
at T12 with prior surgery at L4-L5, which is the same numbering
system on the 3784 MRI.

Alignment: Significant levoconvex lower lumbar and dextroconvex
upper lumbar spine curvature, progressed since 4997. Stable
straightening of lumbar lordosis. Superimposed mild grade 1
anterolisthesis of L2 on L3 (2-3 mm), not significantly changed
since 3784.

Vertebrae: Mild hardware susceptibility artifact at L4 and L5. Faint
degenerative appearing anterior inferior endplate marrow edema at
L1. No other No marrow edema or evidence of acute osseous
abnormality. Intact visible sacrum.

Conus medullaris: Extends to the T12-L1 level and appears normal.

Paraspinal and other soft tissues: Negative visualized abdominal
viscera. Stable paraspinal soft tissues.

Disc levels:

T11-T12: Minimal disc bulge. Mild facet hypertrophy. Borderline to
mild T11 foraminal stenosis.

T12-L1:  Mild disc bulge and facet hypertrophy.  No stenosis.

L1-L2:  Mild disc bulge and facet hypertrophy.  No stenosis.

L2-L3: Mild anterolisthesis. Disc space loss with chronic vacuum
disc. Circumferential disc bulge with broad-based posterior
component. Mild to moderate facet and ligament flavum hypertrophy
greater on the left. Mild to moderate left lateral recess stenosis
(descending left L3 nerve root level, series 9, image 11) borderline
to mild spinal stenosis. No foraminal stenosis. This level has not
significantly changed since the 3784 MRI.

L3-L4: Chronic circumferential but broad-based right subarticular
and far lateral disc bulging and endplate spurring. Moderate facet
hypertrophy. Mild ligament flavum hypertrophy. Moderate to severe
right lateral recess stenosis (descending right L4 nerve root
level). Borderline to mild spinal stenosis. Mild to moderate right
L3 neural foraminal stenosis. This level has not significantly
changed.

L4-L5: Prior decompression and fusion. No stenosis. This level is
stable.

L5-S1: Chronic bilateral posterior element ankylosis. Postoperative
changes to the lamina and lateral recesses. No spinal stenosis. No
lateral recess stenosis. Borderline to mild right L5 foraminal
stenosis. This level is stable.
IMPRESSION: 1. Largely stable MRI appearance of the lumbar spine since 3784.
Chronic decompression and fusion at L4-L5, and chronic posterior
element ankylosis at L5-S1.
2. Although there is adjacent segment disease at L3-L4 with bulky
disc and endplate degeneration, the changes are toward the right,
with no convincing left side neural impingement.
3. The symptomatic level therefore [DATE]-L3 where grade 1
anterolisthesis is associated with disc and posterior element
degeneration resulting in up to mild spinal and moderate left
lateral recess stenosis. Query left L3 radiculitis.
4. Lumbar scoliosis has progressed since 4997. No acute osseous
abnormality.

## 2019-02-01 DIAGNOSIS — F339 Major depressive disorder, recurrent, unspecified: Secondary | ICD-10-CM | POA: Diagnosis not present

## 2019-02-09 IMAGING — RF DG C-ARM 61-120 MIN
1 series · 2 of 2 positions shown · non-contrast
Comparison: 10/29/2016 lumbar spine radiographs

CLINICAL DATA: 65 y/o  F; L2-L4 posterior instrumented fusion.

EXAM:
LUMBAR SPINE - 2-3 VIEW; DG C-ARM 61-120 MIN

[Series 1: run · 2 of 2 slices shown]
[im 1/2]
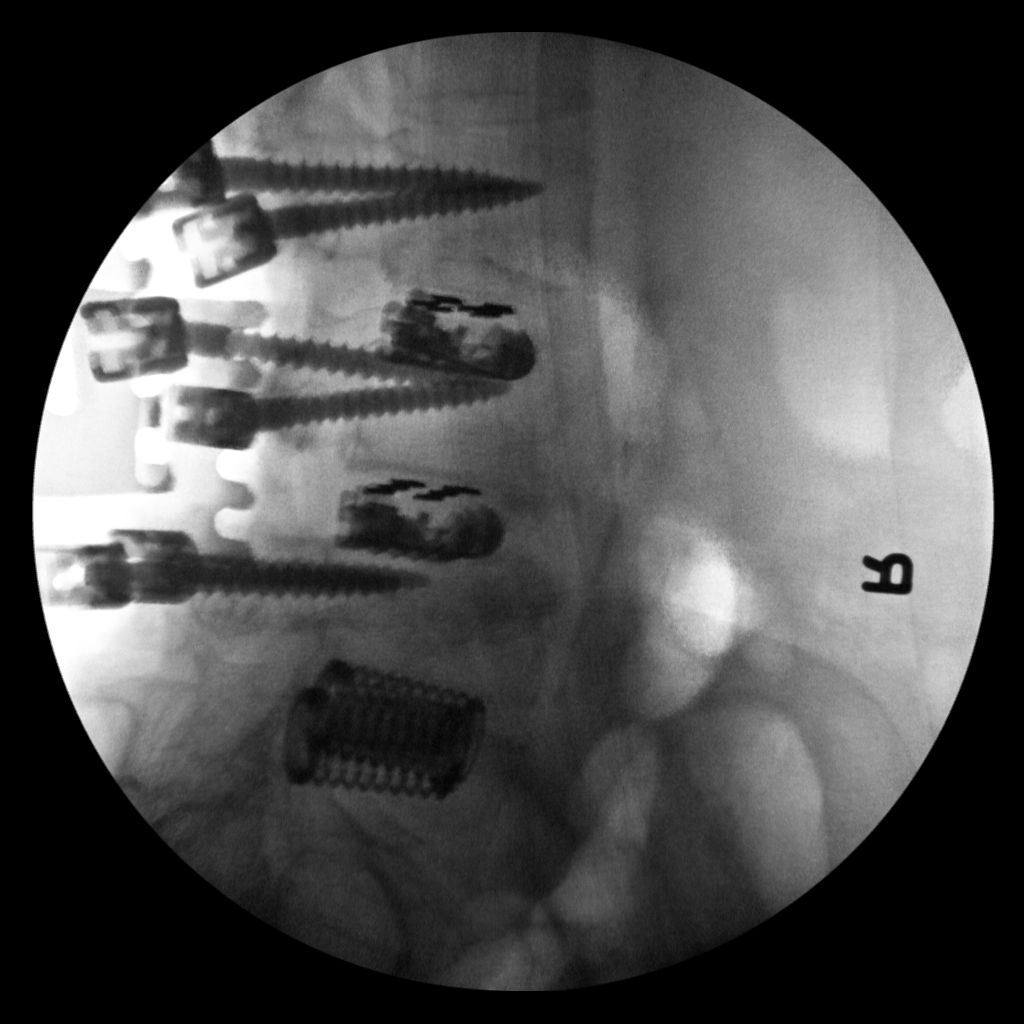
[im 2/2]
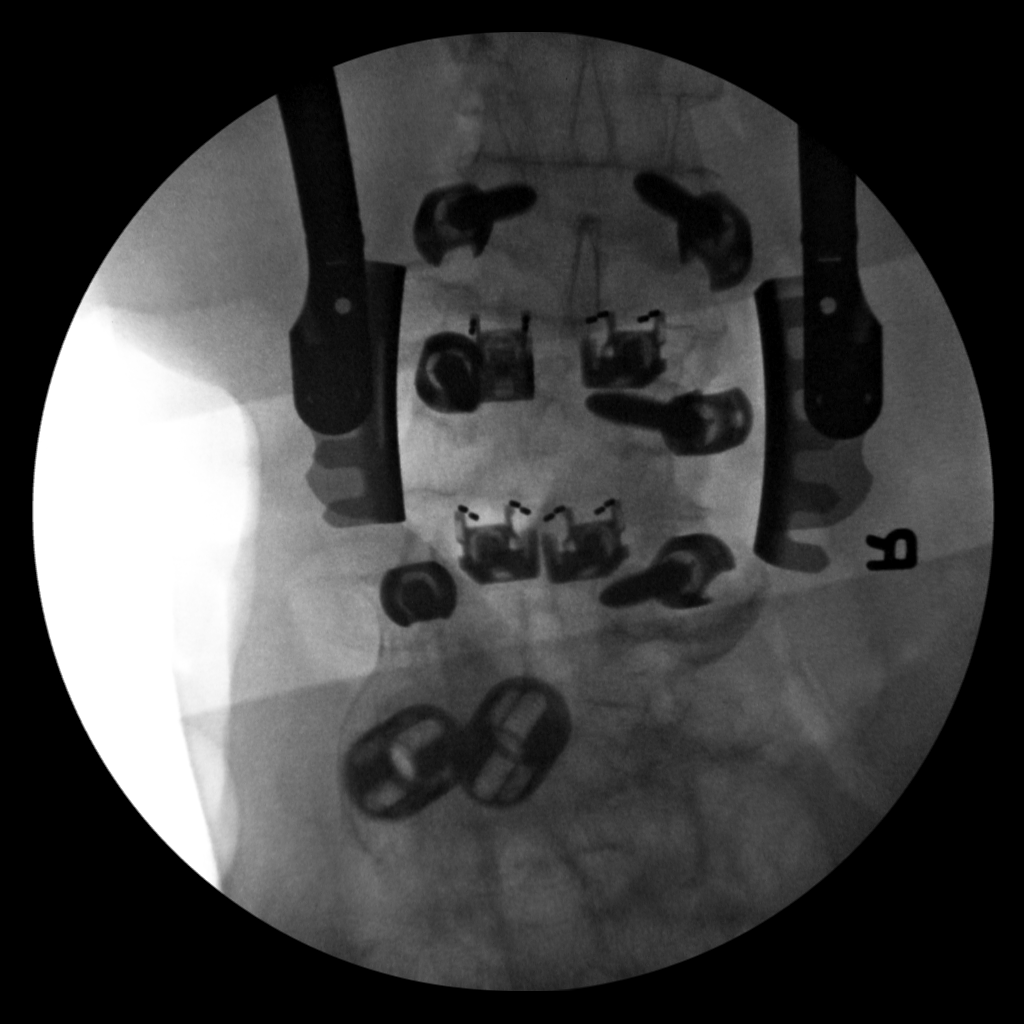

[2 of 2 positions shown; findings below may reference images not displayed]

FINDINGS: Two images of intraoperative fluoroscopy demonstrating interbody
fusion at L4-5 and posterior instrumented and interbody fusion at
L2-L4. Fluoro time is 38 seconds.
IMPRESSION: Intraoperative fluoroscopy of L2-L4 PLIF. Fluoro time is 38 seconds.

By: Ahmad Sadam Raeeskhail M.D.

## 2019-02-22 DIAGNOSIS — H2513 Age-related nuclear cataract, bilateral: Secondary | ICD-10-CM | POA: Diagnosis not present

## 2019-02-26 DIAGNOSIS — H2511 Age-related nuclear cataract, right eye: Secondary | ICD-10-CM | POA: Diagnosis not present

## 2019-02-26 DIAGNOSIS — H401134 Primary open-angle glaucoma, bilateral, indeterminate stage: Secondary | ICD-10-CM | POA: Diagnosis not present

## 2019-03-02 DIAGNOSIS — H2512 Age-related nuclear cataract, left eye: Secondary | ICD-10-CM | POA: Diagnosis not present

## 2019-03-02 DIAGNOSIS — H401131 Primary open-angle glaucoma, bilateral, mild stage: Secondary | ICD-10-CM | POA: Diagnosis not present

## 2019-03-02 DIAGNOSIS — Z01818 Encounter for other preprocedural examination: Secondary | ICD-10-CM | POA: Diagnosis not present

## 2019-03-02 DIAGNOSIS — H401121 Primary open-angle glaucoma, left eye, mild stage: Secondary | ICD-10-CM | POA: Diagnosis not present

## 2019-03-09 DIAGNOSIS — J449 Chronic obstructive pulmonary disease, unspecified: Secondary | ICD-10-CM | POA: Diagnosis not present

## 2019-03-09 DIAGNOSIS — M1990 Unspecified osteoarthritis, unspecified site: Secondary | ICD-10-CM | POA: Diagnosis not present

## 2019-03-09 DIAGNOSIS — Z87891 Personal history of nicotine dependence: Secondary | ICD-10-CM | POA: Diagnosis not present

## 2019-03-09 DIAGNOSIS — J9610 Chronic respiratory failure, unspecified whether with hypoxia or hypercapnia: Secondary | ICD-10-CM | POA: Diagnosis not present

## 2019-03-09 DIAGNOSIS — F418 Other specified anxiety disorders: Secondary | ICD-10-CM | POA: Diagnosis not present

## 2019-03-09 DIAGNOSIS — E039 Hypothyroidism, unspecified: Secondary | ICD-10-CM | POA: Diagnosis not present

## 2019-03-09 DIAGNOSIS — Z9981 Dependence on supplemental oxygen: Secondary | ICD-10-CM | POA: Diagnosis not present

## 2019-03-09 DIAGNOSIS — H401121 Primary open-angle glaucoma, left eye, mild stage: Secondary | ICD-10-CM | POA: Diagnosis not present

## 2019-03-09 DIAGNOSIS — K219 Gastro-esophageal reflux disease without esophagitis: Secondary | ICD-10-CM | POA: Diagnosis not present

## 2019-03-09 DIAGNOSIS — I10 Essential (primary) hypertension: Secondary | ICD-10-CM | POA: Diagnosis not present

## 2019-03-09 DIAGNOSIS — H2512 Age-related nuclear cataract, left eye: Secondary | ICD-10-CM | POA: Diagnosis not present

## 2019-03-09 DIAGNOSIS — H401120 Primary open-angle glaucoma, left eye, stage unspecified: Secondary | ICD-10-CM | POA: Diagnosis not present

## 2019-03-23 DIAGNOSIS — Z79899 Other long term (current) drug therapy: Secondary | ICD-10-CM | POA: Diagnosis not present

## 2019-03-23 DIAGNOSIS — M1990 Unspecified osteoarthritis, unspecified site: Secondary | ICD-10-CM | POA: Diagnosis not present

## 2019-03-23 DIAGNOSIS — Z87891 Personal history of nicotine dependence: Secondary | ICD-10-CM | POA: Diagnosis not present

## 2019-03-23 DIAGNOSIS — K219 Gastro-esophageal reflux disease without esophagitis: Secondary | ICD-10-CM | POA: Diagnosis not present

## 2019-03-23 DIAGNOSIS — J449 Chronic obstructive pulmonary disease, unspecified: Secondary | ICD-10-CM | POA: Diagnosis not present

## 2019-03-23 DIAGNOSIS — H2511 Age-related nuclear cataract, right eye: Secondary | ICD-10-CM | POA: Diagnosis not present

## 2019-03-23 DIAGNOSIS — E039 Hypothyroidism, unspecified: Secondary | ICD-10-CM | POA: Diagnosis not present

## 2019-03-23 DIAGNOSIS — H401111 Primary open-angle glaucoma, right eye, mild stage: Secondary | ICD-10-CM | POA: Diagnosis not present

## 2019-03-23 DIAGNOSIS — Z9981 Dependence on supplemental oxygen: Secondary | ICD-10-CM | POA: Diagnosis not present

## 2019-03-23 DIAGNOSIS — H401110 Primary open-angle glaucoma, right eye, stage unspecified: Secondary | ICD-10-CM | POA: Diagnosis not present

## 2019-05-12 DIAGNOSIS — E039 Hypothyroidism, unspecified: Secondary | ICD-10-CM | POA: Diagnosis not present

## 2019-05-12 DIAGNOSIS — E785 Hyperlipidemia, unspecified: Secondary | ICD-10-CM | POA: Diagnosis not present

## 2019-05-31 DIAGNOSIS — F339 Major depressive disorder, recurrent, unspecified: Secondary | ICD-10-CM | POA: Diagnosis not present

## 2019-08-12 DIAGNOSIS — G629 Polyneuropathy, unspecified: Secondary | ICD-10-CM | POA: Diagnosis not present

## 2019-08-12 DIAGNOSIS — M47816 Spondylosis without myelopathy or radiculopathy, lumbar region: Secondary | ICD-10-CM | POA: Diagnosis not present

## 2019-08-12 DIAGNOSIS — J449 Chronic obstructive pulmonary disease, unspecified: Secondary | ICD-10-CM | POA: Diagnosis not present

## 2019-08-12 DIAGNOSIS — F064 Anxiety disorder due to known physiological condition: Secondary | ICD-10-CM | POA: Diagnosis not present

## 2019-08-30 DIAGNOSIS — F339 Major depressive disorder, recurrent, unspecified: Secondary | ICD-10-CM | POA: Diagnosis not present

## 2019-09-07 DIAGNOSIS — Z0000 Encounter for general adult medical examination without abnormal findings: Secondary | ICD-10-CM | POA: Diagnosis not present

## 2019-09-07 DIAGNOSIS — Z1331 Encounter for screening for depression: Secondary | ICD-10-CM | POA: Diagnosis not present

## 2019-09-07 DIAGNOSIS — Z9181 History of falling: Secondary | ICD-10-CM | POA: Diagnosis not present

## 2019-11-11 DIAGNOSIS — J449 Chronic obstructive pulmonary disease, unspecified: Secondary | ICD-10-CM | POA: Diagnosis not present

## 2019-11-11 DIAGNOSIS — F064 Anxiety disorder due to known physiological condition: Secondary | ICD-10-CM | POA: Diagnosis not present

## 2019-11-11 DIAGNOSIS — E039 Hypothyroidism, unspecified: Secondary | ICD-10-CM | POA: Diagnosis not present

## 2019-11-11 DIAGNOSIS — M47816 Spondylosis without myelopathy or radiculopathy, lumbar region: Secondary | ICD-10-CM | POA: Diagnosis not present

## 2019-11-11 DIAGNOSIS — Z6822 Body mass index (BMI) 22.0-22.9, adult: Secondary | ICD-10-CM | POA: Diagnosis not present

## 2019-11-11 DIAGNOSIS — Z79899 Other long term (current) drug therapy: Secondary | ICD-10-CM | POA: Diagnosis not present

## 2019-11-11 DIAGNOSIS — N3289 Other specified disorders of bladder: Secondary | ICD-10-CM | POA: Diagnosis not present

## 2019-11-11 DIAGNOSIS — G629 Polyneuropathy, unspecified: Secondary | ICD-10-CM | POA: Diagnosis not present

## 2019-11-11 DIAGNOSIS — E785 Hyperlipidemia, unspecified: Secondary | ICD-10-CM | POA: Diagnosis not present

## 2019-12-21 DIAGNOSIS — F339 Major depressive disorder, recurrent, unspecified: Secondary | ICD-10-CM | POA: Diagnosis not present

## 2020-02-11 DIAGNOSIS — G629 Polyneuropathy, unspecified: Secondary | ICD-10-CM | POA: Diagnosis not present

## 2020-02-11 DIAGNOSIS — M47816 Spondylosis without myelopathy or radiculopathy, lumbar region: Secondary | ICD-10-CM | POA: Diagnosis not present

## 2020-02-11 DIAGNOSIS — J449 Chronic obstructive pulmonary disease, unspecified: Secondary | ICD-10-CM | POA: Diagnosis not present

## 2020-02-11 DIAGNOSIS — N3289 Other specified disorders of bladder: Secondary | ICD-10-CM | POA: Diagnosis not present

## 2020-02-11 DIAGNOSIS — F064 Anxiety disorder due to known physiological condition: Secondary | ICD-10-CM | POA: Diagnosis not present

## 2020-02-11 DIAGNOSIS — E039 Hypothyroidism, unspecified: Secondary | ICD-10-CM | POA: Diagnosis not present

## 2020-02-11 DIAGNOSIS — Z23 Encounter for immunization: Secondary | ICD-10-CM | POA: Diagnosis not present

## 2020-02-11 DIAGNOSIS — Z6823 Body mass index (BMI) 23.0-23.9, adult: Secondary | ICD-10-CM | POA: Diagnosis not present

## 2020-02-11 DIAGNOSIS — E785 Hyperlipidemia, unspecified: Secondary | ICD-10-CM | POA: Diagnosis not present

## 2020-03-14 DIAGNOSIS — F339 Major depressive disorder, recurrent, unspecified: Secondary | ICD-10-CM | POA: Diagnosis not present

## 2020-03-20 DIAGNOSIS — Z23 Encounter for immunization: Secondary | ICD-10-CM | POA: Diagnosis not present

## 2020-05-19 DIAGNOSIS — E785 Hyperlipidemia, unspecified: Secondary | ICD-10-CM | POA: Diagnosis not present

## 2020-05-19 DIAGNOSIS — N3289 Other specified disorders of bladder: Secondary | ICD-10-CM | POA: Diagnosis not present

## 2020-05-19 DIAGNOSIS — E039 Hypothyroidism, unspecified: Secondary | ICD-10-CM | POA: Diagnosis not present

## 2020-05-19 DIAGNOSIS — F064 Anxiety disorder due to known physiological condition: Secondary | ICD-10-CM | POA: Diagnosis not present

## 2020-05-19 DIAGNOSIS — J449 Chronic obstructive pulmonary disease, unspecified: Secondary | ICD-10-CM | POA: Diagnosis not present

## 2020-05-19 DIAGNOSIS — M47816 Spondylosis without myelopathy or radiculopathy, lumbar region: Secondary | ICD-10-CM | POA: Diagnosis not present

## 2020-05-19 DIAGNOSIS — G629 Polyneuropathy, unspecified: Secondary | ICD-10-CM | POA: Diagnosis not present

## 2020-06-20 DIAGNOSIS — F339 Major depressive disorder, recurrent, unspecified: Secondary | ICD-10-CM | POA: Diagnosis not present

## 2020-07-05 DIAGNOSIS — H2703 Aphakia, bilateral: Secondary | ICD-10-CM | POA: Diagnosis not present

## 2020-08-21 DIAGNOSIS — E039 Hypothyroidism, unspecified: Secondary | ICD-10-CM | POA: Diagnosis not present

## 2020-08-21 DIAGNOSIS — E785 Hyperlipidemia, unspecified: Secondary | ICD-10-CM | POA: Diagnosis not present

## 2020-08-21 DIAGNOSIS — Z139 Encounter for screening, unspecified: Secondary | ICD-10-CM | POA: Diagnosis not present

## 2020-08-21 DIAGNOSIS — M47816 Spondylosis without myelopathy or radiculopathy, lumbar region: Secondary | ICD-10-CM | POA: Diagnosis not present

## 2020-08-21 DIAGNOSIS — G629 Polyneuropathy, unspecified: Secondary | ICD-10-CM | POA: Diagnosis not present

## 2020-08-21 DIAGNOSIS — N3289 Other specified disorders of bladder: Secondary | ICD-10-CM | POA: Diagnosis not present

## 2020-08-21 DIAGNOSIS — M159 Polyosteoarthritis, unspecified: Secondary | ICD-10-CM | POA: Diagnosis not present

## 2020-08-21 DIAGNOSIS — F064 Anxiety disorder due to known physiological condition: Secondary | ICD-10-CM | POA: Diagnosis not present

## 2020-08-21 DIAGNOSIS — J449 Chronic obstructive pulmonary disease, unspecified: Secondary | ICD-10-CM | POA: Diagnosis not present

## 2020-09-05 DIAGNOSIS — F339 Major depressive disorder, recurrent, unspecified: Secondary | ICD-10-CM | POA: Diagnosis not present

## 2020-11-21 DIAGNOSIS — N3289 Other specified disorders of bladder: Secondary | ICD-10-CM | POA: Diagnosis not present

## 2020-11-21 DIAGNOSIS — M159 Polyosteoarthritis, unspecified: Secondary | ICD-10-CM | POA: Diagnosis not present

## 2020-11-21 DIAGNOSIS — E785 Hyperlipidemia, unspecified: Secondary | ICD-10-CM | POA: Diagnosis not present

## 2020-11-21 DIAGNOSIS — M47816 Spondylosis without myelopathy or radiculopathy, lumbar region: Secondary | ICD-10-CM | POA: Diagnosis not present

## 2020-11-21 DIAGNOSIS — G629 Polyneuropathy, unspecified: Secondary | ICD-10-CM | POA: Diagnosis not present

## 2020-11-21 DIAGNOSIS — Z5320 Procedure and treatment not carried out because of patient's decision for unspecified reasons: Secondary | ICD-10-CM | POA: Diagnosis not present

## 2020-11-21 DIAGNOSIS — E039 Hypothyroidism, unspecified: Secondary | ICD-10-CM | POA: Diagnosis not present

## 2020-11-21 DIAGNOSIS — F064 Anxiety disorder due to known physiological condition: Secondary | ICD-10-CM | POA: Diagnosis not present

## 2020-11-21 DIAGNOSIS — Z6823 Body mass index (BMI) 23.0-23.9, adult: Secondary | ICD-10-CM | POA: Diagnosis not present

## 2020-11-21 DIAGNOSIS — J449 Chronic obstructive pulmonary disease, unspecified: Secondary | ICD-10-CM | POA: Diagnosis not present

## 2020-12-05 DIAGNOSIS — F339 Major depressive disorder, recurrent, unspecified: Secondary | ICD-10-CM | POA: Diagnosis not present

## 2021-01-02 DIAGNOSIS — E785 Hyperlipidemia, unspecified: Secondary | ICD-10-CM | POA: Diagnosis not present

## 2021-01-02 DIAGNOSIS — Z0000 Encounter for general adult medical examination without abnormal findings: Secondary | ICD-10-CM | POA: Diagnosis not present

## 2021-01-02 DIAGNOSIS — Z1331 Encounter for screening for depression: Secondary | ICD-10-CM | POA: Diagnosis not present

## 2021-01-02 DIAGNOSIS — Z9181 History of falling: Secondary | ICD-10-CM | POA: Diagnosis not present

## 2021-02-14 DIAGNOSIS — Z23 Encounter for immunization: Secondary | ICD-10-CM | POA: Diagnosis not present

## 2021-03-05 DIAGNOSIS — F339 Major depressive disorder, recurrent, unspecified: Secondary | ICD-10-CM | POA: Diagnosis not present

## 2021-03-15 DIAGNOSIS — J449 Chronic obstructive pulmonary disease, unspecified: Secondary | ICD-10-CM | POA: Diagnosis not present

## 2021-03-15 DIAGNOSIS — Z6824 Body mass index (BMI) 24.0-24.9, adult: Secondary | ICD-10-CM | POA: Diagnosis not present

## 2021-03-15 DIAGNOSIS — I10 Essential (primary) hypertension: Secondary | ICD-10-CM | POA: Diagnosis not present

## 2021-03-15 DIAGNOSIS — N3281 Overactive bladder: Secondary | ICD-10-CM | POA: Diagnosis not present

## 2021-03-15 DIAGNOSIS — Z23 Encounter for immunization: Secondary | ICD-10-CM | POA: Diagnosis not present

## 2021-03-15 DIAGNOSIS — E785 Hyperlipidemia, unspecified: Secondary | ICD-10-CM | POA: Diagnosis not present

## 2021-03-15 DIAGNOSIS — F064 Anxiety disorder due to known physiological condition: Secondary | ICD-10-CM | POA: Diagnosis not present

## 2021-03-15 DIAGNOSIS — F331 Major depressive disorder, recurrent, moderate: Secondary | ICD-10-CM | POA: Diagnosis not present

## 2021-03-15 DIAGNOSIS — G629 Polyneuropathy, unspecified: Secondary | ICD-10-CM | POA: Diagnosis not present

## 2021-03-15 DIAGNOSIS — E039 Hypothyroidism, unspecified: Secondary | ICD-10-CM | POA: Diagnosis not present

## 2021-03-15 DIAGNOSIS — M159 Polyosteoarthritis, unspecified: Secondary | ICD-10-CM | POA: Diagnosis not present

## 2021-04-13 DIAGNOSIS — E039 Hypothyroidism, unspecified: Secondary | ICD-10-CM | POA: Diagnosis not present

## 2021-04-13 DIAGNOSIS — Z6824 Body mass index (BMI) 24.0-24.9, adult: Secondary | ICD-10-CM | POA: Diagnosis not present

## 2021-04-13 DIAGNOSIS — J449 Chronic obstructive pulmonary disease, unspecified: Secondary | ICD-10-CM | POA: Diagnosis not present

## 2021-04-13 DIAGNOSIS — F331 Major depressive disorder, recurrent, moderate: Secondary | ICD-10-CM | POA: Diagnosis not present

## 2021-04-13 DIAGNOSIS — I10 Essential (primary) hypertension: Secondary | ICD-10-CM | POA: Diagnosis not present

## 2021-04-13 DIAGNOSIS — F064 Anxiety disorder due to known physiological condition: Secondary | ICD-10-CM | POA: Diagnosis not present

## 2021-04-13 DIAGNOSIS — M159 Polyosteoarthritis, unspecified: Secondary | ICD-10-CM | POA: Diagnosis not present

## 2021-04-13 DIAGNOSIS — E785 Hyperlipidemia, unspecified: Secondary | ICD-10-CM | POA: Diagnosis not present

## 2021-04-13 DIAGNOSIS — N3281 Overactive bladder: Secondary | ICD-10-CM | POA: Diagnosis not present

## 2021-04-13 DIAGNOSIS — G629 Polyneuropathy, unspecified: Secondary | ICD-10-CM | POA: Diagnosis not present

## 2021-04-26 DIAGNOSIS — R1110 Vomiting, unspecified: Secondary | ICD-10-CM | POA: Diagnosis not present

## 2021-04-26 DIAGNOSIS — R051 Acute cough: Secondary | ICD-10-CM | POA: Diagnosis not present

## 2021-04-26 DIAGNOSIS — M791 Myalgia, unspecified site: Secondary | ICD-10-CM | POA: Diagnosis not present

## 2021-04-26 DIAGNOSIS — R0981 Nasal congestion: Secondary | ICD-10-CM | POA: Diagnosis not present

## 2021-04-26 DIAGNOSIS — Z20828 Contact with and (suspected) exposure to other viral communicable diseases: Secondary | ICD-10-CM | POA: Diagnosis not present

## 2021-05-11 DIAGNOSIS — N3281 Overactive bladder: Secondary | ICD-10-CM | POA: Diagnosis not present

## 2021-05-11 DIAGNOSIS — M159 Polyosteoarthritis, unspecified: Secondary | ICD-10-CM | POA: Diagnosis not present

## 2021-05-11 DIAGNOSIS — G629 Polyneuropathy, unspecified: Secondary | ICD-10-CM | POA: Diagnosis not present

## 2021-05-11 DIAGNOSIS — F064 Anxiety disorder due to known physiological condition: Secondary | ICD-10-CM | POA: Diagnosis not present

## 2021-05-11 DIAGNOSIS — I10 Essential (primary) hypertension: Secondary | ICD-10-CM | POA: Diagnosis not present

## 2021-05-11 DIAGNOSIS — E785 Hyperlipidemia, unspecified: Secondary | ICD-10-CM | POA: Diagnosis not present

## 2021-05-11 DIAGNOSIS — E039 Hypothyroidism, unspecified: Secondary | ICD-10-CM | POA: Diagnosis not present

## 2021-05-11 DIAGNOSIS — Z6824 Body mass index (BMI) 24.0-24.9, adult: Secondary | ICD-10-CM | POA: Diagnosis not present

## 2021-05-11 DIAGNOSIS — F331 Major depressive disorder, recurrent, moderate: Secondary | ICD-10-CM | POA: Diagnosis not present

## 2021-05-11 DIAGNOSIS — J449 Chronic obstructive pulmonary disease, unspecified: Secondary | ICD-10-CM | POA: Diagnosis not present

## 2021-06-04 DIAGNOSIS — F339 Major depressive disorder, recurrent, unspecified: Secondary | ICD-10-CM | POA: Diagnosis not present

## 2021-06-08 DIAGNOSIS — F331 Major depressive disorder, recurrent, moderate: Secondary | ICD-10-CM | POA: Diagnosis not present

## 2021-06-08 DIAGNOSIS — N3281 Overactive bladder: Secondary | ICD-10-CM | POA: Diagnosis not present

## 2021-06-08 DIAGNOSIS — E039 Hypothyroidism, unspecified: Secondary | ICD-10-CM | POA: Diagnosis not present

## 2021-06-08 DIAGNOSIS — E785 Hyperlipidemia, unspecified: Secondary | ICD-10-CM | POA: Diagnosis not present

## 2021-06-08 DIAGNOSIS — Z6824 Body mass index (BMI) 24.0-24.9, adult: Secondary | ICD-10-CM | POA: Diagnosis not present

## 2021-06-08 DIAGNOSIS — F064 Anxiety disorder due to known physiological condition: Secondary | ICD-10-CM | POA: Diagnosis not present

## 2021-06-08 DIAGNOSIS — G629 Polyneuropathy, unspecified: Secondary | ICD-10-CM | POA: Diagnosis not present

## 2021-06-08 DIAGNOSIS — I10 Essential (primary) hypertension: Secondary | ICD-10-CM | POA: Diagnosis not present

## 2021-06-08 DIAGNOSIS — J449 Chronic obstructive pulmonary disease, unspecified: Secondary | ICD-10-CM | POA: Diagnosis not present

## 2021-06-08 DIAGNOSIS — M159 Polyosteoarthritis, unspecified: Secondary | ICD-10-CM | POA: Diagnosis not present

## 2021-07-06 DIAGNOSIS — E039 Hypothyroidism, unspecified: Secondary | ICD-10-CM | POA: Diagnosis not present

## 2021-07-06 DIAGNOSIS — M159 Polyosteoarthritis, unspecified: Secondary | ICD-10-CM | POA: Diagnosis not present

## 2021-07-06 DIAGNOSIS — F331 Major depressive disorder, recurrent, moderate: Secondary | ICD-10-CM | POA: Diagnosis not present

## 2021-07-06 DIAGNOSIS — N3281 Overactive bladder: Secondary | ICD-10-CM | POA: Diagnosis not present

## 2021-07-06 DIAGNOSIS — F064 Anxiety disorder due to known physiological condition: Secondary | ICD-10-CM | POA: Diagnosis not present

## 2021-07-06 DIAGNOSIS — Z6824 Body mass index (BMI) 24.0-24.9, adult: Secondary | ICD-10-CM | POA: Diagnosis not present

## 2021-07-06 DIAGNOSIS — G629 Polyneuropathy, unspecified: Secondary | ICD-10-CM | POA: Diagnosis not present

## 2021-07-06 DIAGNOSIS — I10 Essential (primary) hypertension: Secondary | ICD-10-CM | POA: Diagnosis not present

## 2021-07-06 DIAGNOSIS — E785 Hyperlipidemia, unspecified: Secondary | ICD-10-CM | POA: Diagnosis not present

## 2021-07-06 DIAGNOSIS — J449 Chronic obstructive pulmonary disease, unspecified: Secondary | ICD-10-CM | POA: Diagnosis not present

## 2021-08-06 DIAGNOSIS — F064 Anxiety disorder due to known physiological condition: Secondary | ICD-10-CM | POA: Diagnosis not present

## 2021-08-06 DIAGNOSIS — G629 Polyneuropathy, unspecified: Secondary | ICD-10-CM | POA: Diagnosis not present

## 2021-08-06 DIAGNOSIS — Z6823 Body mass index (BMI) 23.0-23.9, adult: Secondary | ICD-10-CM | POA: Diagnosis not present

## 2021-08-06 DIAGNOSIS — N3281 Overactive bladder: Secondary | ICD-10-CM | POA: Diagnosis not present

## 2021-08-06 DIAGNOSIS — M159 Polyosteoarthritis, unspecified: Secondary | ICD-10-CM | POA: Diagnosis not present

## 2021-08-06 DIAGNOSIS — F331 Major depressive disorder, recurrent, moderate: Secondary | ICD-10-CM | POA: Diagnosis not present

## 2021-08-06 DIAGNOSIS — E039 Hypothyroidism, unspecified: Secondary | ICD-10-CM | POA: Diagnosis not present

## 2021-08-06 DIAGNOSIS — J449 Chronic obstructive pulmonary disease, unspecified: Secondary | ICD-10-CM | POA: Diagnosis not present

## 2021-08-06 DIAGNOSIS — I10 Essential (primary) hypertension: Secondary | ICD-10-CM | POA: Diagnosis not present

## 2021-08-06 DIAGNOSIS — R5382 Chronic fatigue, unspecified: Secondary | ICD-10-CM | POA: Diagnosis not present

## 2021-08-06 DIAGNOSIS — E785 Hyperlipidemia, unspecified: Secondary | ICD-10-CM | POA: Diagnosis not present

## 2021-09-03 DIAGNOSIS — F339 Major depressive disorder, recurrent, unspecified: Secondary | ICD-10-CM | POA: Diagnosis not present

## 2021-09-06 DIAGNOSIS — E039 Hypothyroidism, unspecified: Secondary | ICD-10-CM | POA: Diagnosis not present

## 2021-09-06 DIAGNOSIS — G629 Polyneuropathy, unspecified: Secondary | ICD-10-CM | POA: Diagnosis not present

## 2021-09-06 DIAGNOSIS — Z23 Encounter for immunization: Secondary | ICD-10-CM | POA: Diagnosis not present

## 2021-09-06 DIAGNOSIS — E871 Hypo-osmolality and hyponatremia: Secondary | ICD-10-CM | POA: Diagnosis not present

## 2021-09-06 DIAGNOSIS — I10 Essential (primary) hypertension: Secondary | ICD-10-CM | POA: Diagnosis not present

## 2021-09-06 DIAGNOSIS — F331 Major depressive disorder, recurrent, moderate: Secondary | ICD-10-CM | POA: Diagnosis not present

## 2021-09-06 DIAGNOSIS — N390 Urinary tract infection, site not specified: Secondary | ICD-10-CM | POA: Diagnosis not present

## 2021-09-06 DIAGNOSIS — M159 Polyosteoarthritis, unspecified: Secondary | ICD-10-CM | POA: Diagnosis not present

## 2021-09-06 DIAGNOSIS — R7989 Other specified abnormal findings of blood chemistry: Secondary | ICD-10-CM | POA: Diagnosis not present

## 2021-09-06 DIAGNOSIS — J449 Chronic obstructive pulmonary disease, unspecified: Secondary | ICD-10-CM | POA: Diagnosis not present

## 2021-09-06 DIAGNOSIS — F064 Anxiety disorder due to known physiological condition: Secondary | ICD-10-CM | POA: Diagnosis not present

## 2021-09-06 DIAGNOSIS — E785 Hyperlipidemia, unspecified: Secondary | ICD-10-CM | POA: Diagnosis not present

## 2021-09-06 DIAGNOSIS — N3281 Overactive bladder: Secondary | ICD-10-CM | POA: Diagnosis not present

## 2021-10-02 DIAGNOSIS — H2703 Aphakia, bilateral: Secondary | ICD-10-CM | POA: Diagnosis not present

## 2021-10-04 DIAGNOSIS — I10 Essential (primary) hypertension: Secondary | ICD-10-CM | POA: Diagnosis not present

## 2021-10-04 DIAGNOSIS — R7989 Other specified abnormal findings of blood chemistry: Secondary | ICD-10-CM | POA: Diagnosis not present

## 2021-10-04 DIAGNOSIS — E871 Hypo-osmolality and hyponatremia: Secondary | ICD-10-CM | POA: Diagnosis not present

## 2021-10-04 DIAGNOSIS — E039 Hypothyroidism, unspecified: Secondary | ICD-10-CM | POA: Diagnosis not present

## 2021-10-04 DIAGNOSIS — F331 Major depressive disorder, recurrent, moderate: Secondary | ICD-10-CM | POA: Diagnosis not present

## 2021-10-04 DIAGNOSIS — J449 Chronic obstructive pulmonary disease, unspecified: Secondary | ICD-10-CM | POA: Diagnosis not present

## 2021-10-04 DIAGNOSIS — N390 Urinary tract infection, site not specified: Secondary | ICD-10-CM | POA: Diagnosis not present

## 2021-10-04 DIAGNOSIS — F064 Anxiety disorder due to known physiological condition: Secondary | ICD-10-CM | POA: Diagnosis not present

## 2021-10-04 DIAGNOSIS — G629 Polyneuropathy, unspecified: Secondary | ICD-10-CM | POA: Diagnosis not present

## 2021-10-04 DIAGNOSIS — M159 Polyosteoarthritis, unspecified: Secondary | ICD-10-CM | POA: Diagnosis not present

## 2021-10-04 DIAGNOSIS — E785 Hyperlipidemia, unspecified: Secondary | ICD-10-CM | POA: Diagnosis not present

## 2021-10-04 DIAGNOSIS — N3281 Overactive bladder: Secondary | ICD-10-CM | POA: Diagnosis not present

## 2021-11-05 DIAGNOSIS — E785 Hyperlipidemia, unspecified: Secondary | ICD-10-CM | POA: Diagnosis not present

## 2021-11-05 DIAGNOSIS — G629 Polyneuropathy, unspecified: Secondary | ICD-10-CM | POA: Diagnosis not present

## 2021-11-05 DIAGNOSIS — K219 Gastro-esophageal reflux disease without esophagitis: Secondary | ICD-10-CM | POA: Diagnosis not present

## 2021-11-05 DIAGNOSIS — R7989 Other specified abnormal findings of blood chemistry: Secondary | ICD-10-CM | POA: Diagnosis not present

## 2021-11-05 DIAGNOSIS — N390 Urinary tract infection, site not specified: Secondary | ICD-10-CM | POA: Diagnosis not present

## 2021-11-05 DIAGNOSIS — J449 Chronic obstructive pulmonary disease, unspecified: Secondary | ICD-10-CM | POA: Diagnosis not present

## 2021-11-05 DIAGNOSIS — F064 Anxiety disorder due to known physiological condition: Secondary | ICD-10-CM | POA: Diagnosis not present

## 2021-11-05 DIAGNOSIS — M159 Polyosteoarthritis, unspecified: Secondary | ICD-10-CM | POA: Diagnosis not present

## 2021-11-05 DIAGNOSIS — E871 Hypo-osmolality and hyponatremia: Secondary | ICD-10-CM | POA: Diagnosis not present

## 2021-11-05 DIAGNOSIS — I10 Essential (primary) hypertension: Secondary | ICD-10-CM | POA: Diagnosis not present

## 2021-11-05 DIAGNOSIS — F331 Major depressive disorder, recurrent, moderate: Secondary | ICD-10-CM | POA: Diagnosis not present

## 2021-11-05 DIAGNOSIS — N3281 Overactive bladder: Secondary | ICD-10-CM | POA: Diagnosis not present

## 2021-12-03 DIAGNOSIS — F339 Major depressive disorder, recurrent, unspecified: Secondary | ICD-10-CM | POA: Diagnosis not present

## 2021-12-04 DIAGNOSIS — K219 Gastro-esophageal reflux disease without esophagitis: Secondary | ICD-10-CM | POA: Diagnosis not present

## 2021-12-04 DIAGNOSIS — N390 Urinary tract infection, site not specified: Secondary | ICD-10-CM | POA: Diagnosis not present

## 2021-12-04 DIAGNOSIS — G629 Polyneuropathy, unspecified: Secondary | ICD-10-CM | POA: Diagnosis not present

## 2021-12-04 DIAGNOSIS — I10 Essential (primary) hypertension: Secondary | ICD-10-CM | POA: Diagnosis not present

## 2021-12-04 DIAGNOSIS — F064 Anxiety disorder due to known physiological condition: Secondary | ICD-10-CM | POA: Diagnosis not present

## 2021-12-04 DIAGNOSIS — M159 Polyosteoarthritis, unspecified: Secondary | ICD-10-CM | POA: Diagnosis not present

## 2021-12-04 DIAGNOSIS — J449 Chronic obstructive pulmonary disease, unspecified: Secondary | ICD-10-CM | POA: Diagnosis not present

## 2021-12-04 DIAGNOSIS — N3281 Overactive bladder: Secondary | ICD-10-CM | POA: Diagnosis not present

## 2021-12-04 DIAGNOSIS — F331 Major depressive disorder, recurrent, moderate: Secondary | ICD-10-CM | POA: Diagnosis not present

## 2021-12-04 DIAGNOSIS — E871 Hypo-osmolality and hyponatremia: Secondary | ICD-10-CM | POA: Diagnosis not present

## 2021-12-04 DIAGNOSIS — E785 Hyperlipidemia, unspecified: Secondary | ICD-10-CM | POA: Diagnosis not present

## 2021-12-04 DIAGNOSIS — R7989 Other specified abnormal findings of blood chemistry: Secondary | ICD-10-CM | POA: Diagnosis not present

## 2021-12-06 ENCOUNTER — Other Ambulatory Visit: Payer: Self-pay | Admitting: *Deleted

## 2021-12-06 NOTE — Patient Outreach (Signed)
  Care Coordination   12/06/2021 Name: [name] MRN: [id number] DOB: [birth year]   Care Coordination Outreach Attempts:  An unsuccessful telephone outreach was attempted today to offer the patient information about available care coordination services as a benefit of their health plan.   Follow Up Plan:  Additional outreach attempts will be made to offer the patient care coordination information and services.   Encounter Outcome:  No Answer  Care Coordination Interventions Activated:  No   Care Coordination Interventions:  No, not indicated    Dontrelle Mazon C. Burgess Estelle, MSN, [facility] Gerontological Nurse Practitioner [facility] Care Management [phone]

## 2021-12-12 ENCOUNTER — Other Ambulatory Visit: Payer: Self-pay | Admitting: *Deleted

## 2021-12-12 NOTE — Patient Outreach (Signed)
  Care Coordination   12/12/2021 Name: [name] MRN: [id number] DOB: 1950/08/29   Care Coordination Outreach Attempts:  A second unsuccessful outreach was attempted today to offer the patient with information about available care coordination services as a benefit of their health plan.     Pt cell mail box is full and her home # is not working. Listed contact called and they are not able to contact her either.  Follow Up Plan:  No further outreach attempts will be made at this time. We have been unable to contact the patient to offer or enroll patient in care coordination services  Encounter Outcome:  No Answer  Care Coordination Interventions Activated:  No   Care Coordination Interventions:  No, not indicated    Stockton Nunley C. Burgess Estelle, MSN, [facility] Gerontological Nurse Practitioner [facility] Care Management [phone]

## 2022-01-03 DIAGNOSIS — I10 Essential (primary) hypertension: Secondary | ICD-10-CM | POA: Diagnosis not present

## 2022-01-03 DIAGNOSIS — E039 Hypothyroidism, unspecified: Secondary | ICD-10-CM | POA: Diagnosis not present

## 2022-01-03 DIAGNOSIS — R5382 Chronic fatigue, unspecified: Secondary | ICD-10-CM | POA: Diagnosis not present

## 2022-01-31 DIAGNOSIS — I10 Essential (primary) hypertension: Secondary | ICD-10-CM | POA: Diagnosis not present

## 2022-02-28 DIAGNOSIS — E785 Hyperlipidemia, unspecified: Secondary | ICD-10-CM | POA: Diagnosis not present

## 2022-03-12 DIAGNOSIS — Z23 Encounter for immunization: Secondary | ICD-10-CM | POA: Diagnosis not present

## 2022-03-25 DIAGNOSIS — F339 Major depressive disorder, recurrent, unspecified: Secondary | ICD-10-CM | POA: Diagnosis not present

## 2022-04-26 DIAGNOSIS — E039 Hypothyroidism, unspecified: Secondary | ICD-10-CM | POA: Diagnosis not present

## 2023-06-11 ENCOUNTER — Encounter (HOSPITAL_COMMUNITY): Payer: Self-pay

## 2023-06-11 ENCOUNTER — Observation Stay (HOSPITAL_COMMUNITY): Payer: Medicare HMO

## 2023-06-11 ENCOUNTER — Emergency Department (HOSPITAL_COMMUNITY): Payer: Medicare HMO

## 2023-06-11 ENCOUNTER — Other Ambulatory Visit: Payer: Self-pay

## 2023-06-11 ENCOUNTER — Observation Stay (HOSPITAL_COMMUNITY)
Admission: EM | Admit: 2023-06-11 | Discharge: 2023-06-13 | Disposition: A | Discharge status: Home / Self Care | Payer: Medicare HMO | Attending: Internal Medicine | Admitting: Internal Medicine

## 2023-06-11 DIAGNOSIS — E039 Hypothyroidism, unspecified: Secondary | ICD-10-CM | POA: Diagnosis not present

## 2023-06-11 DIAGNOSIS — R2689 Other abnormalities of gait and mobility: Secondary | ICD-10-CM | POA: Diagnosis not present

## 2023-06-11 DIAGNOSIS — M549 Dorsalgia, unspecified: Secondary | ICD-10-CM | POA: Diagnosis not present

## 2023-06-11 DIAGNOSIS — J189 Pneumonia, unspecified organism: Secondary | ICD-10-CM | POA: Diagnosis not present

## 2023-06-11 DIAGNOSIS — I959 Hypotension, unspecified: Secondary | ICD-10-CM | POA: Diagnosis not present

## 2023-06-11 DIAGNOSIS — I10 Essential (primary) hypertension: Secondary | ICD-10-CM | POA: Diagnosis not present

## 2023-06-11 DIAGNOSIS — N179 Acute kidney failure, unspecified: Secondary | ICD-10-CM | POA: Insufficient documentation

## 2023-06-11 DIAGNOSIS — R531 Weakness: Secondary | ICD-10-CM | POA: Diagnosis present

## 2023-06-11 DIAGNOSIS — J168 Pneumonia due to other specified infectious organisms: Secondary | ICD-10-CM | POA: Insufficient documentation

## 2023-06-11 DIAGNOSIS — R4182 Altered mental status, unspecified: Secondary | ICD-10-CM | POA: Insufficient documentation

## 2023-06-11 DIAGNOSIS — Z7982 Long term (current) use of aspirin: Secondary | ICD-10-CM | POA: Diagnosis not present

## 2023-06-11 DIAGNOSIS — G8929 Other chronic pain: Secondary | ICD-10-CM | POA: Diagnosis not present

## 2023-06-11 DIAGNOSIS — Z87891 Personal history of nicotine dependence: Secondary | ICD-10-CM | POA: Insufficient documentation

## 2023-06-11 DIAGNOSIS — Z79899 Other long term (current) drug therapy: Secondary | ICD-10-CM | POA: Diagnosis not present

## 2023-06-11 DIAGNOSIS — E876 Hypokalemia: Secondary | ICD-10-CM | POA: Diagnosis not present

## 2023-06-11 DIAGNOSIS — Z1152 Encounter for screening for COVID-19: Secondary | ICD-10-CM | POA: Insufficient documentation

## 2023-06-11 LAB — LACTIC ACID, PLASMA: Lactic Acid, Venous: 2 mmol/L (ref 0.5–1.9)

## 2023-06-11 LAB — CBC WITH DIFFERENTIAL/PLATELET
Abs Immature Granulocytes: 0.02 10*3/uL (ref 0.00–0.07)
Basophils Absolute: 0 10*3/uL (ref 0.0–0.1)
Basophils Relative: 0 %
Eosinophils Absolute: 0 10*3/uL (ref 0.0–0.5)
Eosinophils Relative: 0 %
HCT: 32.8 % — ABNORMAL LOW (ref 36.0–46.0)
Hemoglobin: 11.1 g/dL — ABNORMAL LOW (ref 12.0–15.0)
Immature Granulocytes: 0 %
Lymphocytes Relative: 4 %
Lymphs Abs: 0.3 10*3/uL — ABNORMAL LOW (ref 0.7–4.0)
MCH: 32.3 pg (ref 26.0–34.0)
MCHC: 33.8 g/dL (ref 30.0–36.0)
MCV: 95.3 fL (ref 80.0–100.0)
Monocytes Absolute: 0.6 10*3/uL (ref 0.1–1.0)
Monocytes Relative: 7 %
Neutro Abs: 7.6 10*3/uL (ref 1.7–7.7)
Neutrophils Relative %: 89 %
Platelets: 280 10*3/uL (ref 150–400)
RBC: 3.44 MIL/uL — ABNORMAL LOW (ref 3.87–5.11)
RDW: 12.9 % (ref 11.5–15.5)
WBC: 8.5 10*3/uL (ref 4.0–10.5)
nRBC: 0 % (ref 0.0–0.2)

## 2023-06-11 LAB — TROPONIN I (HIGH SENSITIVITY)
Troponin I (High Sensitivity): 5 ng/L (ref <18)
Troponin I (High Sensitivity): 6 ng/L (ref <18)

## 2023-06-11 LAB — COMPREHENSIVE METABOLIC PANEL
ALT: 30 U/L (ref 0–44)
AST: 39 U/L (ref 15–41)
Albumin: 3.3 g/dL — ABNORMAL LOW (ref 3.5–5.0)
Alkaline Phosphatase: 68 U/L (ref 38–126)
Anion gap: 10 (ref 5–15)
BUN: 21 mg/dL (ref 8–23)
CO2: 21 mmol/L — ABNORMAL LOW (ref 22–32)
Calcium: 8.8 mg/dL — ABNORMAL LOW (ref 8.9–10.3)
Chloride: 104 mmol/L (ref 98–111)
Creatinine, Ser: 1.34 mg/dL — ABNORMAL HIGH (ref 0.44–1.00)
GFR, Estimated: 42 mL/min — ABNORMAL LOW (ref >60)
Glucose, Bld: 135 mg/dL — ABNORMAL HIGH (ref 70–99)
Potassium: 3.5 mmol/L (ref 3.5–5.1)
Sodium: 135 mmol/L (ref 135–145)
Total Bilirubin: 0.5 mg/dL (ref 0.0–1.2)
Total Protein: 6 g/dL — ABNORMAL LOW (ref 6.5–8.1)

## 2023-06-11 LAB — MAGNESIUM: Magnesium: 1.4 mg/dL — ABNORMAL LOW (ref 1.7–2.4)

## 2023-06-11 LAB — SALICYLATE LEVEL: Salicylate Lvl: <7 mg/dL — ABNORMAL LOW (ref 7.0–30.0)

## 2023-06-11 LAB — HEMOGLOBIN A1C
Hgb A1c MFr Bld: 4.6 % — ABNORMAL LOW (ref 4.8–5.6)
Mean Plasma Glucose: 85.32 mg/dL

## 2023-06-11 LAB — CORTISOL: Cortisol, Plasma: 15.9 ug/dL

## 2023-06-11 LAB — RESP PANEL BY RT-PCR (RSV, FLU A&B, COVID)  RVPGX2
Influenza A by PCR: NEGATIVE
Influenza B by PCR: NEGATIVE
Resp Syncytial Virus by PCR: NEGATIVE
SARS Coronavirus 2 by RT PCR: NEGATIVE

## 2023-06-11 LAB — I-STAT CG4 LACTIC ACID, ED: Lactic Acid, Venous: 1.5 mmol/L (ref 0.5–1.9)

## 2023-06-11 LAB — AMMONIA: Ammonia: 14 umol/L (ref 9–35)

## 2023-06-11 LAB — CBG MONITORING, ED: Glucose-Capillary: 128 mg/dL — ABNORMAL HIGH (ref 70–99)

## 2023-06-11 MED ORDER — LEVOTHYROXINE SODIUM 75 MCG PO TABS
75.0000 ug | ORAL_TABLET | Freq: Every day | ORAL | Status: DC
  Administered 2023-06-12 – 2023-06-13 (×2): 75 ug via ORAL
  Filled 2023-06-11 (×2): qty 1

## 2023-06-11 MED ORDER — PNEUMOCOCCAL 20-VAL CONJ VACC 0.5 ML IM SUSY
0.5000 mL | PREFILLED_SYRINGE | INTRAMUSCULAR | Status: DC
  Filled 2023-06-11: qty 0.5

## 2023-06-11 MED ORDER — SODIUM CHLORIDE 0.9 % IV SOLN
500.0000 mg | Freq: Once | INTRAVENOUS | Status: AC
  Administered 2023-06-11: 500 mg via INTRAVENOUS
  Filled 2023-06-11: qty 5

## 2023-06-11 MED ORDER — LORAZEPAM 0.5 MG PO TABS
0.5000 mg | ORAL_TABLET | Freq: Two times a day (BID) | ORAL | Status: DC | PRN
  Administered 2023-06-11 – 2023-06-12 (×2): 0.5 mg via ORAL
  Filled 2023-06-11 (×2): qty 1

## 2023-06-11 MED ORDER — SODIUM CHLORIDE 0.9 % IV BOLUS
1000.0000 mL | Freq: Once | INTRAVENOUS | Status: AC
  Administered 2023-06-11: 1000 mL via INTRAVENOUS

## 2023-06-11 MED ORDER — ACETAMINOPHEN 650 MG RE SUPP: 650.0000 mg | Freq: Four times a day (QID) | RECTAL | Status: DC | PRN

## 2023-06-11 MED ORDER — SODIUM CHLORIDE 0.9 % IV SOLN
1.0000 g | Freq: Once | INTRAVENOUS | Status: AC
  Administered 2023-06-11: 1 g via INTRAVENOUS
  Filled 2023-06-11: qty 10

## 2023-06-11 MED ORDER — LACTATED RINGERS IV BOLUS: 1000.0000 mL | Freq: Once | INTRAVENOUS | Status: DC

## 2023-06-11 MED ORDER — MIRTAZAPINE 15 MG PO TABS
45.0000 mg | ORAL_TABLET | Freq: Every day | ORAL | Status: DC
  Administered 2023-06-11 – 2023-06-12 (×2): 45 mg via ORAL
  Filled 2023-06-11 (×2): qty 3

## 2023-06-11 MED ORDER — ACETAMINOPHEN 325 MG PO TABS: 650.0000 mg | ORAL_TABLET | Freq: Four times a day (QID) | ORAL | Status: DC | PRN

## 2023-06-11 MED ORDER — LACTATED RINGERS IV BOLUS
500.0000 mL | Freq: Once | INTRAVENOUS | Status: AC
Start: 2023-06-11 — End: 2023-06-11
  Administered 2023-06-11: 500 mL via INTRAVENOUS

## 2023-06-11 MED ORDER — ASPIRIN 81 MG PO TBEC
81.0000 mg | DELAYED_RELEASE_TABLET | Freq: Every day | ORAL | Status: DC
  Administered 2023-06-12 – 2023-06-13 (×2): 81 mg via ORAL
  Filled 2023-06-11 (×2): qty 1

## 2023-06-11 MED ORDER — BACLOFEN 10 MG PO TABS
10.0000 mg | ORAL_TABLET | Freq: Three times a day (TID) | ORAL | Status: DC
  Administered 2023-06-11 – 2023-06-13 (×6): 10 mg via ORAL
  Filled 2023-06-11 (×6): qty 1

## 2023-06-11 MED ORDER — FLUOXETINE HCL 20 MG PO CAPS
20.0000 mg | ORAL_CAPSULE | Freq: Every day | ORAL | Status: DC
  Administered 2023-06-11 – 2023-06-12 (×2): 20 mg via ORAL
  Filled 2023-06-11 (×2): qty 1

## 2023-06-11 MED ORDER — ENSURE ENLIVE PO LIQD
237.0000 mL | Freq: Two times a day (BID) | ORAL | Status: DC
  Administered 2023-06-12 – 2023-06-13 (×3): 237 mL via ORAL

## 2023-06-11 MED ORDER — GUAIFENESIN-DM 100-10 MG/5ML PO SYRP
5.0000 mL | ORAL_SOLUTION | ORAL | Status: DC | PRN
  Administered 2023-06-11: 5 mL via ORAL
  Filled 2023-06-11: qty 5

## 2023-06-11 MED ORDER — GABAPENTIN 300 MG PO CAPS
600.0000 mg | ORAL_CAPSULE | Freq: Three times a day (TID) | ORAL | Status: DC
  Administered 2023-06-11: 600 mg via ORAL
  Filled 2023-06-11: qty 2

## 2023-06-11 MED ORDER — GABAPENTIN 300 MG PO CAPS
600.0000 mg | ORAL_CAPSULE | Freq: Two times a day (BID) | ORAL | Status: DC
  Administered 2023-06-12: 600 mg via ORAL
  Filled 2023-06-11: qty 2

## 2023-06-11 MED ORDER — AZITHROMYCIN 250 MG PO TABS
500.0000 mg | ORAL_TABLET | Freq: Every day | ORAL | Status: DC
Start: 2023-06-12 — End: 2023-06-13
  Administered 2023-06-12 – 2023-06-13 (×2): 500 mg via ORAL
  Filled 2023-06-11 (×2): qty 2

## 2023-06-11 MED ORDER — ENOXAPARIN SODIUM 40 MG/0.4ML IJ SOSY
40.0000 mg | PREFILLED_SYRINGE | INTRAMUSCULAR | Status: DC
  Administered 2023-06-11 – 2023-06-12 (×2): 40 mg via SUBCUTANEOUS
  Filled 2023-06-11 (×2): qty 0.4

## 2023-06-11 MED ORDER — MAGNESIUM SULFATE 2 GM/50ML IV SOLN
2.0000 g | Freq: Once | INTRAVENOUS | Status: AC
  Administered 2023-06-11: 2 g via INTRAVENOUS
  Filled 2023-06-11: qty 50

## 2023-06-11 MED ORDER — POLYETHYLENE GLYCOL 3350 17 G PO PACK: 17.0000 g | PACK | Freq: Every day | ORAL | Status: DC | PRN

## 2023-06-11 MED ORDER — BUPRENORPHINE HCL-NALOXONE HCL 8-2 MG SL SUBL
1.0000 | SUBLINGUAL_TABLET | Freq: Three times a day (TID) | SUBLINGUAL | Status: DC
  Administered 2023-06-11 – 2023-06-12 (×2): 1 via SUBLINGUAL
  Filled 2023-06-11 (×3): qty 1

## 2023-06-11 MED ORDER — SODIUM CHLORIDE 0.9 % IV SOLN
1.0000 g | INTRAVENOUS | Status: DC
  Administered 2023-06-12: 1 g via INTRAVENOUS
  Filled 2023-06-11: qty 10

## 2023-06-11 NOTE — Progress Notes (Addendum)
Transfer from ED  Pt on 2 L nasal cannula , pt o2 sats was 84% off oxygen.  She went up to 92% on 2L  1 L IV bolus restarted, BP 88/56, Briscoe Burns, MD notfied.

## 2023-06-11 NOTE — ED Provider Triage Note (Signed)
Emergency Medicine Provider Triage Evaluation Note  [name] , a 73 y.o. female  was evaluated in triage.  Pt complains of development of generalized weakness over the last week with some cough and congestion.  She reports in the last 24 hours the weakness has become so severe she was having a difficult time even walking in her house and reports that she just went down today and felt like she could not move her arms or her legs.  They have just felt so heavy bilaterally.  She has had a mild headache and feels like her ears are ringing.  She does report taking aspirin but no other anticoagulants.  She has had no recent medication changes.  She has been eating and drinking and denies any vomiting or diarrhea.  She denies any urinary symptoms.  Shortness of breath but no chest pain.  She does report a history of chronic back pain and had a stimulator at one time which has since been removed.  Review of Systems  Positive: Shortness of breath, cough, congestion, generalized weakness Negative: Fever, chest pain, nausea vomiting or diarrhea  Physical Exam  BP (!) 155/112 (BP Location: Left Arm)   Pulse 74   Temp 98.1 F (36.7 C) (Oral)   Resp 18   SpO2 91%  Gen:   Awake, no distress   Resp:  Normal effort  MSK:   Moves extremities without difficulty in the upper and lower extremities but slightly clumsy movement with bilateral hands Other:  TMs are normal, pale in appearance with some mild pale conjunctiva  Medical Decision Making  Medically screening exam initiated at 9:35 AM.  Appropriate orders placed.  [name] was informed that the remainder of the evaluation will be completed by another provider, this initial triage assessment does not replace that evaluation, and the importance of remaining in the ED until their evaluation is complete.     Gwyneth Sprout, MD 06/11/23 [phone]

## 2023-06-11 NOTE — ED Provider Notes (Signed)
[place] EMERGENCY DEPARTMENT AT [facility] Provider Note   CSN: [id number] Arrival date & time: 06/11/23  0920     History  Chief Complaint  Patient presents with   Weakness   HPI [name] is a 73 y.o. female with history of recurrent pneumonia, chronic pain, hypertension, hypothyroidism presenting for weakness.  States she is having weakness in heaviness in both arms and her fingers.  Also states she is more lethargic than usual.  She mentioned that she has fallen 5 times in the last week.  States both legs are also very weak.  Reporting some lower back pain in the left lower back.  Her daughter also mentioned that around 8 AM this morning shortly after she arrived here she started to "hallucinate.  She was seeing visions of her late husband.  She also remembering things that she felt happened last year but actually 20 years ago.   Weakness      Home Medications Prior to Admission medications   Medication Sig Start Date End Date Taking? Authorizing Provider  acetaminophen (TYLENOL) 500 MG tablet Take 2 tablets (1,000 mg total) every 8 (eight) hours as needed by mouth. 03/04/17   Regalado, Belkys A, MD  albuterol (PROVENTIL HFA;VENTOLIN HFA) 108 (90 Base) MCG/ACT inhaler Inhale 1-2 puffs into the lungs every 6 (six) hours as needed for wheezing or shortness of breath.    [provider]  aspirin 325 MG tablet Take 325 mg by mouth daily.      [provider]  baclofen (LIORESAL) 20 MG tablet Take 20 mg by mouth every 8 (eight) hours.     [provider]  ferrous sulfate 325 (65 FE) MG tablet Take 1 tablet (325 mg total) 2 (two) times daily with a meal by mouth. 03/04/17   Regalado, Belkys A, MD  fluorometholone (FML) 0.1 % ophthalmic suspension Place 1 drop into both eyes 2 (two) times daily. 02/03/17   [provider]  FLUoxetine (PROZAC) 20 MG tablet Take 20 mg by mouth at bedtime.    [provider]  gabapentin  (NEURONTIN) 800 MG tablet Take 800 mg by mouth every 6 (six) hours.    [provider]  levothyroxine (SYNTHROID, LEVOTHROID) 75 MCG tablet Take 75 mcg by mouth daily before breakfast.    [provider]  LORazepam (ATIVAN) 0.5 MG tablet Take 0.5 mg by mouth 2 (two) times daily. 06/03/23   [provider]  mirtazapine (REMERON) 45 MG tablet Take 45 mg by mouth at bedtime.      [provider]  nebivolol (BYSTOLIC) 2.5 MG tablet Take 2.5 mg by mouth daily.      [provider]  roflumilast (DALIRESP) 500 MCG TABS tablet Take 500 mcg by mouth daily.    [provider]  solifenacin (VESICARE) 10 MG tablet Take 10 mg by mouth daily.     [provider]      Allergies    Fentanyl and Oxycontin [oxycodone hcl]    Review of Systems   Review of Systems  Neurological:  Positive for weakness.     Physical Exam   Vitals:   06/11/23 1307 06/11/23 1318  BP: 94/64   Pulse: 77   Resp: 20   Temp:  98.1 F (36.7 C)  SpO2: 97%     CONSTITUTIONAL:  well-appearing, NAD NEURO:  Alert and oriented x 3, CN 3-12 grossly intact, confused, told me multiple times that she was seeing visions of her  husband, was remembering things that she thought were happening a year ago and actually were happening 20 years ago per her daughter, moving all her extremities, strength and sensation in the upper and lower extremities grossly symmetric and intact EYES:  eyes equal and reactive ENT/NECK:  Supple, no stridor  CARDIO: Regular rate and rhythm, appears well-perfused  PULM:  No respiratory distress, rales in the right mid and lower lobe with diminished breath sounds, all other fields CTAB GI/GU:  non-distended, soft MSK/SPINE:  No gross deformities, no edema, moves all extremities  SKIN:  no rash, atraumatic  *Additional and/or pertinent findings included in MDM below   ED Results / Procedures / Treatments   Labs (all labs ordered are listed, but only  abnormal results are displayed) Labs Reviewed  CBC WITH DIFFERENTIAL/PLATELET - Abnormal; Notable for the following components:      Result Value   RBC 3.44 (*)    Hemoglobin 11.1 (*)    HCT 32.8 (*)    Lymphs Abs 0.3 (*)    All other components within normal limits  COMPREHENSIVE METABOLIC PANEL - Abnormal; Notable for the following components:   CO2 21 (*)    Glucose, Bld 135 (*)    Creatinine, Ser 1.34 (*)    Calcium 8.8 (*)    Total Protein 6.0 (*)    Albumin 3.3 (*)    GFR, Estimated 42 (*)    All other components within normal limits  MAGNESIUM - Abnormal; Notable for the following components:   Magnesium 1.4 (*)    All other components within normal limits  SALICYLATE LEVEL - Abnormal; Notable for the following components:   Salicylate Lvl <7.0 (*)    All other components within normal limits  CBG MONITORING, ED - Abnormal; Notable for the following components:   Glucose-Capillary 128 (*)    All other components within normal limits  RESP PANEL BY RT-PCR (RSV, FLU A&B, COVID)  RVPGX2  CULTURE, BLOOD (ROUTINE X 2)  CULTURE, BLOOD (ROUTINE X 2)  AMMONIA  URINALYSIS, W/ REFLEX TO CULTURE (INFECTION SUSPECTED)  STREP PNEUMONIAE URINARY ANTIGEN  URINALYSIS, ROUTINE W REFLEX MICROSCOPIC  HEMOGLOBIN A1C  I-STAT CG4 LACTIC ACID, ED  TROPONIN I (HIGH SENSITIVITY)  TROPONIN I (HIGH SENSITIVITY)    EKG EKG Interpretation Date/Time:  Wednesday June 11 2023 09:45:29 EST Ventricular Rate:  74 PR Interval:  156 QRS Duration:  86 QT Interval:  416 QTC Calculation: 461 R Axis:   54  Text Interpretation: Normal sinus rhythm new Nonspecific ST and T wave abnormality When compared with ECG of 20-Feb-2017 13:52, PREVIOUS ECG IS PRESENT Confirmed by Gwyneth Sprout ([id number]) on 06/11/2023 10:46:56 AM  Radiology DG Chest 1 View Result Date: 06/11/2023 CLINICAL DATA:  Weakness EXAM: CHEST  1 VIEW COMPARISON:  07/03/2018 FINDINGS: Heart size is mildly enlarged. Dense airspace  consolidation within the right lower lobe. Additional sites of hazy opacity within the right upper lobe and left lower lobe. No pleural effusion or pneumothorax. IMPRESSION: Dense airspace consolidation within the right lower lobe with additional sites of hazy opacity within the right upper lobe and left lower lobe. Appearance most compatible with multifocal pneumonia. Radiographic follow-up to resolution is recommended. Electronically Signed   By: Duanne Guess D.O.   On: 06/11/2023 11:08    Procedures .Critical Care  Performed by: Gareth Eagle, PA-C Authorized by: Gareth Eagle, PA-C   Critical care provider statement:    Critical care time (minutes):  30   Critical care  was necessary to treat or prevent imminent or life-threatening deterioration of the following conditions:  Sepsis and renal failure   Critical care was time spent personally by me on the following activities:  Development of treatment plan with patient or surrogate, discussions with consultants, evaluation of patient's response to treatment, examination of patient, ordering and review of laboratory studies, ordering and review of radiographic studies, ordering and performing treatments and interventions, pulse oximetry, re-evaluation of patient's condition and review of old charts     Medications Ordered in ED Medications  azithromycin (ZITHROMAX) 500 mg in sodium chloride 0.9 % 250 mL IVPB (has no administration in time range)  sodium chloride 0.9 % bolus 1,000 mL (has no administration in time range)  enoxaparin (LOVENOX) injection 40 mg (has no administration in time range)  acetaminophen (TYLENOL) tablet 650 mg (has no administration in time range)    Or  acetaminophen (TYLENOL) suppository 650 mg (has no administration in time range)  polyethylene glycol (MIRALAX / GLYCOLAX) packet 17 g (has no administration in time range)  buprenorphine-naloxone (SUBOXONE) 8-2 mg per SL tablet 1 tablet (has no  administration in time range)  LORazepam (ATIVAN) tablet 0.5 mg (has no administration in time range)  baclofen (LIORESAL) tablet 10 mg (has no administration in time range)  FLUoxetine (PROZAC) capsule 20 mg (has no administration in time range)  levothyroxine (SYNTHROID) tablet 75 mcg (has no administration in time range)  aspirin EC tablet 81 mg (has no administration in time range)  gabapentin (NEURONTIN) capsule 600 mg (has no administration in time range)  cefTRIAXone (ROCEPHIN) 1 g in sodium chloride 0.9 % 100 mL IVPB (0 g Intravenous Stopped 06/11/23 1422)    ED Course/ Medical Decision Making/ A&P                                 Medical Decision Making Amount and/or Complexity of Data Reviewed Labs: ordered. Radiology: ordered.  Risk Decision regarding hospitalization.   Initial Impression and Ddx 73 year old well-appearing female presenting for weakness and altered mental status.  Exam was largely reassuring but notable for confusion.  DDx includes sepsis, pneumonia, UTI, metabolic encephalopathy, stroke, pelvic fracture or hip close location other. Patient PMH that increases complexity of ED encounter:  history of recurrent pneumonia, chronic pain, hypertension, hypothyroidism   Interpretation of Diagnostics - I independent reviewed and interpreted the labs as followed: Mildly reduced GFR  - I independently visualized the following imaging with scope of interpretation limited to determining acute life threatening conditions related to emergency care: CXR, which revealed concerns for multifocal pneumonia  -I personally reviewed interpret EKG which revealed NSR  Patient Reassessment and Ultimate Disposition/Management Given her change in mental status per her daughter, findings suggestive of pneumonia on chest x-ray along with hypotension this did prompt concern for sepsis.  I started her on IV Rocephin and ceftriaxone.  Also renal function concerning for AKI.  Considered  stroke but no appreciable focal deficits on neuroexam, for now will defer brain imaging.  SPECT her confusion is more related to pneumonia.  Admitted to hospital service with Dr. Austin Miles.   Patient management required discussion with the following services or consulting groups:  Hospitalist Service  Complexity of Problems Addressed Acute complicated illness or Injury  Additional Data Reviewed and Analyzed Further history obtained from: Further history from spouse/family member, Past medical history and medications listed in the EMR, and Prior ED visit notes  Patient  Encounter Risk Assessment Consideration of hospitalization         Final Clinical Impression(s) / ED Diagnoses Final diagnoses:  Pneumonia due to infectious organism, unspecified laterality, unspecified part of lung  Altered mental status, unspecified altered mental status type    Rx / DC Orders ED Discharge Orders     None         Gareth Eagle, PA-C 06/11/23 1429    Melene Plan, DO 06/11/23 1436

## 2023-06-11 NOTE — Progress Notes (Signed)
Chaplain went to 6N10 to see pt. The room was still cleaning. The RN told me that the pt has not moved yet from ED to 6N. Chaplain will follow up later.   M.Kubra Delano Metz Resident [phone]

## 2023-06-11 NOTE — Progress Notes (Signed)
BP rechecked 86/58 MAP 68  MD Jinwala notified via epic chat

## 2023-06-11 NOTE — H&P (Signed)
History and Physical    PatientMarland Kitchen [name]:[id number] DOB: Dec 07, 1950 DOA: 06/11/2023 DOS: the patient was seen and examined on 06/11/2023 PCP: Wilmer Floor., MD  Patient coming from: Home  Chief Complaint:  Chief Complaint  Patient presents with   Weakness   HPI: [name] is a 73 y.o. female with medical history significant of hypertension, hypothyroidism, depression, anxiety, chronic back pain, osteoarthritis, history of recurrent pneumonia presenting to the ED with weakness.  Patient reports longstanding history of debilitating back pain.  She has had several back surgeries in the past but despite this she continues to have chronic back pain that impedes with her ADLs.  She states that over the past week or so, she has noted worsening weakness in both arms and legs.  She uses a cane at home for ambulation but has been feeling very unsteady despite this.  She does have rolling walker as well but is unable to use this at home due to layout.  Patient reports having fell about 4-5 times in the past week primarily due to her arm and leg weakness.  She denies any fevers, chills, nausea, vomiting, chest pain, palpitations, shortness of breath, abdominal pain.  She denies any significant numbness, bowel/bladder dysfunction.  Patient lives alone.  Patient states that she is typically very anxious and usually avoids hospital settings but given that her symptoms were not improving, she called her daughter to bring her in.  Patient and daughter report that patient experienced brief hallucination after arrival to the ED where she was seeing visions of her late husband.  Patient reports remote history of empyema and notes that she has experienced recurrent pneumonia multiple times thereafter.  ED course: Vital signs notable for mild hypotension and new oxygen requirement of 2 L [place].  CBC without leukocytosis and with hemoglobin 11.1 (baseline appears to be around 8.4 as of 6  years ago).  CMP with mild hyperglycemia to 135, creatinine 1.34 (previous baseline appears to be around 0.75 about 6 years ago).  Magnesium level 1.4.  Troponin normal at 5.  Respiratory panel negative for COVID, flu, RSV.  Lactic acid normal at 1.5.  Chest x-ray showing multifocal pneumonia.  [facility] hospitalist asked to evaluate patient for admission.  Review of Systems: As mentioned in the history of present illness. All other systems reviewed and are negative. Past Medical History:  Diagnosis Date   Anxiety    Chronic pain    Depression    Dyspnea    with  URI   GERD (gastroesophageal reflux disease)    Headache(784.0)    hx. migraines   Hypertension    Hypothyroidism    Osteoarthritis    Recurrent pneumonia    Urinary incontinence    Past Surgical History:  Procedure Laterality Date   ACHILLES TENDON REPAIR     HERNIA REPAIR     LUMBAR FUSION     LUMBAR LAMINECTOMY     nerve stimulator insertion and subsequent removal     TUBAL LIGATION     Social History:  reports that she quit smoking about 19 years ago. Her smoking use included cigarettes. She started smoking about 34 years ago. She has a 15 pack-year smoking history. She has never used smokeless tobacco. She reports that she does not drink alcohol and does not use drugs.  Allergies  Allergen Reactions   Fentanyl Other (See Comments)    02-28-17 Pt reports that she does not have an allergy to this medication  Oxycontin [Oxycodone Hcl] Other (See Comments)    Hallucinations , can take percocet with no issues     Family History  Problem Relation Age of Onset   Breast cancer Mother    Heart disease Father     Prior to Admission medications   Medication Sig Start Date End Date Taking? Authorizing Provider  acetaminophen (TYLENOL) 500 MG tablet Take 2 tablets (1,000 mg total) every 8 (eight) hours as needed by mouth. 03/04/17   Regalado, Belkys A, MD  albuterol (PROVENTIL HFA;VENTOLIN HFA) 108 (90 Base) MCG/ACT inhaler  Inhale 1-2 puffs into the lungs every 6 (six) hours as needed for wheezing or shortness of breath.    [provider]  ALPRAZolam Prudy Feeler) 0.5 MG tablet Take 0.25-0.5 mg by mouth 3 (three) times daily. Take 0.25 mg in the morning and the afternoon and 0.5 mg at night    [provider]  amitriptyline (ELAVIL) 50 MG tablet Take 50 mg by mouth at bedtime.    [provider]  aspirin 325 MG tablet Take 325 mg by mouth daily.      [provider]  baclofen (LIORESAL) 20 MG tablet Take 20 mg by mouth every 8 (eight) hours.     [provider]  diazepam (VALIUM) 5 MG tablet Take 1-2 tablets (5-10 mg total) by mouth every 6 (six) hours as needed for muscle spasms. 02/26/17   Julio Sicks, MD  diclofenac (VOLTAREN) 50 MG EC tablet Take 50 mg by mouth 4 (four) times daily. 12/31/16   [provider]  ferrous sulfate 325 (65 FE) MG tablet Take 1 tablet (325 mg total) 2 (two) times daily with a meal by mouth. 03/04/17   Regalado, Belkys A, MD  fluorometholone (FML) 0.1 % ophthalmic suspension Place 1 drop into both eyes 2 (two) times daily. 02/03/17   [provider]  FLUoxetine (PROZAC) 20 MG tablet Take 20 mg by mouth at bedtime.    [provider]  gabapentin (NEURONTIN) 800 MG tablet Take 800 mg by mouth every 6 (six) hours.    [provider]  Guaifenesin 1200 MG TB12 Take 1,200 mg by mouth 2 (two) times daily.    [provider]  levothyroxine (SYNTHROID, LEVOTHROID) 75 MCG tablet Take 75 mcg by mouth daily before breakfast.    [provider]  loperamide (IMODIUM A-D) 2 MG tablet Take 2 mg by mouth as needed for diarrhea or loose stools.    [provider]  mirtazapine (REMERON) 45 MG tablet Take 45 mg by mouth at bedtime.      [provider]  misoprostol (CYTOTEC) 200 MCG tablet Take 200 mcg by mouth 4 (four) times daily.    [provider]  nebivolol (BYSTOLIC) 2.5 MG tablet Take 2.5  mg by mouth daily.      [provider]  oxyCODONE-acetaminophen (PERCOCET/ROXICET) 5-325 MG tablet Take 1-2 tablets by mouth every 4 (four) hours as needed for moderate pain. 02/26/17   Julio Sicks, MD  roflumilast (DALIRESP) 500 MCG TABS tablet Take 500 mcg by mouth daily.    [provider]  solifenacin (VESICARE) 10 MG tablet Take 10 mg by mouth daily.     [provider]  trimethoprim (TRIMPEX) 100 MG tablet Take 100 mg by mouth at bedtime.    [provider]    Physical Exam: Vitals:   06/11/23 0935 06/11/23 1300 06/11/23 1307 06/11/23 1318  BP:  (!) 87/69 94/64   Pulse:  81 77  Resp:  18 20   Temp:    98.1 F (36.7 C)  TempSrc:    Oral  SpO2:  (!) 87% 97%   Weight: 58.5 kg     Height: 5\' 5"  (1.651 m)      Physical Exam Constitutional:      Comments: Thin, chronically ill-appearing elderly female, laying in bed, NAD.  HENT:     Head: Normocephalic and atraumatic.     Mouth/Throat:     Mouth: Mucous membranes are dry.     Pharynx: Oropharynx is clear. No oropharyngeal exudate.  Eyes:     General: No scleral icterus.    Extraocular Movements: Extraocular movements intact.     Conjunctiva/sclera: Conjunctivae normal.     Pupils: Pupils are equal, round, and reactive to light.  Cardiovascular:     Rate and Rhythm: Normal rate and regular rhythm.     Pulses: Normal pulses.     Heart sounds: Normal heart sounds. No murmur heard.    No friction rub. No gallop.  Pulmonary:     Effort: Pulmonary effort is normal. No respiratory distress.     Breath sounds: Normal breath sounds. No wheezing, rhonchi or rales.  Abdominal:     General: Bowel sounds are normal. There is no distension.     Palpations: Abdomen is soft.     Tenderness: There is no abdominal tenderness. There is no guarding or rebound.  Musculoskeletal:        General: No swelling.     Comments: Chronic low back, patient uncomfortable and moving from side to side due to back  pain.  Skin:    General: Skin is warm and dry.  Neurological:     General: No focal deficit present.     Mental Status: She is alert and oriented to person, place, and time.     Comments: Sensation intact bilaterally. Strength 4+/5 in all extremities.   Psychiatric:     Comments: Anxious mood.     Data Reviewed:     Latest Ref Rng & Units 06/11/2023    9:37 AM 03/04/2017    5:31 AM 03/03/2017    6:26 AM  CBC  WBC 4.0 - 10.5 K/uL 8.5  6.3  6.7   Hemoglobin 12.0 - 15.0 g/dL 16.1  8.4  8.4   Hematocrit 36.0 - 46.0 % 32.8  25.0  25.0   Platelets 150 - 400 K/uL 280  289  226       Latest Ref Rng & Units 06/11/2023    9:37 AM 03/04/2017    5:31 AM 03/03/2017    6:26 AM  CMP  Glucose 70 - 99 mg/dL 096  97  90   BUN 8 - 23 mg/dL 21  <5  <5   Creatinine 0.44 - 1.00 mg/dL 0.45  4.09  8.11   Sodium 135 - 145 mmol/L 135  142  141   Potassium 3.5 - 5.1 mmol/L 3.5  3.7  3.6   Chloride 98 - 111 mmol/L 104  107  107   CO2 22 - 32 mmol/L 21  25  26    Calcium 8.9 - 10.3 mg/dL 8.8  8.8  8.5   Total Protein 6.5 - 8.1 g/dL 6.0   5.3   Total Bilirubin 0.0 - 1.2 mg/dL 0.5   0.8   Alkaline Phos 38 - 126 U/L 68   172   AST 15 - 41 U/L 39   26   ALT 0 - 44 U/L  30   62    Lactic Acid, Venous    Component Value Date/Time   LATICACIDVEN 1.5 06/11/2023 1315   DG Chest 1 View Result Date: 06/11/2023 CLINICAL DATA:  Weakness EXAM: CHEST  1 VIEW COMPARISON:  07/03/2018 FINDINGS: Heart size is mildly enlarged. Dense airspace consolidation within the right lower lobe. Additional sites of hazy opacity within the right upper lobe and left lower lobe. No pleural effusion or pneumothorax. IMPRESSION: Dense airspace consolidation within the right lower lobe with additional sites of hazy opacity within the right upper lobe and left lower lobe. Appearance most compatible with multifocal pneumonia. Radiographic follow-up to resolution is recommended. Electronically Signed   By: Duanne Guess D.O.   On:  06/11/2023 11:08    Assessment and Plan: No notes have been filed under this hospital service. Service: Hospitalist  Multifocal pneumonia History of recurrent pneumonia Patient with history of recurrent pneumonia, now presenting with multifocal pneumonia noted on chest x-ray.  She is not having any significant symptoms and is saturating well on 2 L [place].  She has remained afebrile since arrival and is without leukocytosis on CBC.  Lactic acid normal. -Rocephin and azithromycin for 5 days -Follow-up blood cultures -Follow-up strep pneumo urinary antigen -Supplemental oxygen as needed to maintain O2 sats greater than 92% -PT/OT eval -Heart healthy diet -Lovenox for DVT prophylaxis -Place in observation  Hypotension History of hypertension Patient with history of hypertension on nebivolol 2.5 mg daily.  Since arrival, her blood pressures have ranged in the 80s-90s/60s.  Her maps have remained above 65.  She does not appear septic and does not clearly meet sepsis criteria.  She received 1 L IV fluids in the ED, will provide another liter now. -Trend blood pressure curve -Goal MAP greater than 65 -Providing another liter IV fluids  Generalized weakness Chronic back pain likely secondary to degenerative spondylolisthesis Patient's primary reason for coming to the ED is generalized weakness.  She reports a longstanding history of chronic back pain and has had multiple surgeries for this in the past.  MRI lumbar spine in 2019 revealed pedicle screw and interbody fusion L2-L5 along with a postop discectomy on left L5-S1.  She is on Suboxone 8-2 mg sublingual films 3 times daily at home along with baclofen 3 times daily and gabapentin.  Patient does live alone and has sustained several falls over the past week due to weakness.  She does not have any focal deficits on exam but will obtain CT head without contrast for further evaluation.  She may need a higher level of care at discharge. -Resume home  Suboxone (PDMP reviewed and appropriate) -Resume home baclofen at a reduced dose of 10 mg 3 times daily -Resume home gabapentin at reduced dose of 600 mg twice daily -Follow-up CT head without contrast -PT/OT eval  AKI Patient with baseline creatinine around 0.75-0.9.  On arrival, creatinine 1.34.  This is likely prerenal in the setting of decreased oral intake.  Receiving IV fluid rehydration. -IV fluids as noted above -Trend kidney function -Strict I/O's, monitor urine output -Avoid nephrotoxic medications as able  Depression and anxiety -Resume home fluoxetine 20 mg nightly -Resume home mirtazapine 45 mg nightly -Resume home lorazepam 0.5 mg twice daily as needed (PDMP reviewed and appropriate)  Hypothyroidism -Resume home Synthroid 75 mcg daily   Advance Care Planning:   Code Status: Full Code   Consults: none  Family Communication: updated family at bedside  Severity of Illness: The appropriate patient status for this  patient is OBSERVATION. Observation status is judged to be reasonable and necessary in order to provide the required intensity of service to ensure the patient's safety. The patient's presenting symptoms, physical exam findings, and initial radiographic and laboratory data in the context of their medical condition is felt to place them at decreased risk for further clinical deterioration. Furthermore, it is anticipated that the patient will be medically stable for discharge from the hospital within 2 midnights of admission.   Portions of this note were generated with Scientist, clinical (histocompatibility and immunogenetics). Dictation errors may occur despite best attempts at proofreading.   Author: Briscoe Burns, MD 06/11/2023 2:41 PM  For on call review [email or web address].

## 2023-06-11 NOTE — Progress Notes (Signed)
Went to visit with pt. Pt moved to 6N10.  Will pass on to unit Chaplain to follow.  Venida Jarvis, [place], [facility], Pager [phone]

## 2023-06-11 NOTE — ED Triage Notes (Signed)
Pt here for lethargy and weakness and states heaviness in arms and fingers that started this past week. Axox4. Denies blurred vision.

## 2023-06-12 DIAGNOSIS — J189 Pneumonia, unspecified organism: Secondary | ICD-10-CM | POA: Diagnosis not present

## 2023-06-12 LAB — CBC
HCT: 27.3 % — ABNORMAL LOW (ref 36.0–46.0)
Hemoglobin: 9.5 g/dL — ABNORMAL LOW (ref 12.0–15.0)
MCH: 32.6 pg (ref 26.0–34.0)
MCHC: 34.8 g/dL (ref 30.0–36.0)
MCV: 93.8 fL (ref 80.0–100.0)
Platelets: 199 10*3/uL (ref 150–400)
RBC: 2.91 MIL/uL — ABNORMAL LOW (ref 3.87–5.11)
RDW: 13.1 % (ref 11.5–15.5)
WBC: 10.7 10*3/uL — ABNORMAL HIGH (ref 4.0–10.5)
nRBC: 0 % (ref 0.0–0.2)

## 2023-06-12 LAB — BASIC METABOLIC PANEL
Anion gap: 8 (ref 5–15)
BUN: 10 mg/dL (ref 8–23)
CO2: 22 mmol/L (ref 22–32)
Calcium: 8.3 mg/dL — ABNORMAL LOW (ref 8.9–10.3)
Chloride: 107 mmol/L (ref 98–111)
Creatinine, Ser: 0.69 mg/dL (ref 0.44–1.00)
GFR, Estimated: >60 mL/min (ref >60)
Glucose, Bld: 112 mg/dL — ABNORMAL HIGH (ref 70–99)
Potassium: 3.3 mmol/L — ABNORMAL LOW (ref 3.5–5.1)
Sodium: 137 mmol/L (ref 135–145)

## 2023-06-12 LAB — URINALYSIS, W/ REFLEX TO CULTURE (INFECTION SUSPECTED)
Bacteria, UA: NONE SEEN
Bilirubin Urine: NEGATIVE
Glucose, UA: NEGATIVE mg/dL
Hgb urine dipstick: NEGATIVE
Ketones, ur: 5 mg/dL — AB
Leukocytes,Ua: NEGATIVE
Nitrite: NEGATIVE
Protein, ur: NEGATIVE mg/dL
Specific Gravity, Urine: 1.01 (ref 1.005–1.030)
pH: 5 (ref 5.0–8.0)

## 2023-06-12 MED ORDER — FESOTERODINE FUMARATE ER 4 MG PO TB24
4.0000 mg | ORAL_TABLET | Freq: Every day | ORAL | Status: DC
  Administered 2023-06-12 – 2023-06-13 (×2): 4 mg via ORAL
  Filled 2023-06-12 (×2): qty 1

## 2023-06-12 MED ORDER — ALBUTEROL SULFATE (2.5 MG/3ML) 0.083% IN NEBU: 3.0000 mL | INHALATION_SOLUTION | Freq: Four times a day (QID) | RESPIRATORY_TRACT | Status: DC | PRN

## 2023-06-12 MED ORDER — LOPERAMIDE HCL 2 MG PO CAPS
2.0000 mg | ORAL_CAPSULE | ORAL | Status: DC | PRN
  Administered 2023-06-12 – 2023-06-13 (×5): 2 mg via ORAL
  Filled 2023-06-12 (×5): qty 1

## 2023-06-12 MED ORDER — ACETAMINOPHEN 500 MG PO TABS
500.0000 mg | ORAL_TABLET | Freq: Four times a day (QID) | ORAL | Status: DC
  Administered 2023-06-12 – 2023-06-13 (×5): 500 mg via ORAL
  Filled 2023-06-12 (×6): qty 1

## 2023-06-12 MED ORDER — BUPRENORPHINE HCL-NALOXONE HCL 8-2 MG SL FILM
1.0000 | ORAL_FILM | Freq: Three times a day (TID) | SUBLINGUAL | Status: DC
  Administered 2023-06-12 – 2023-06-13 (×2): 1 via SUBLINGUAL
  Filled 2023-06-12 (×2): qty 1

## 2023-06-12 MED ORDER — LORAZEPAM 0.5 MG PO TABS
0.5000 mg | ORAL_TABLET | ORAL | Status: DC | PRN
  Administered 2023-06-12: 0.5 mg via ORAL
  Filled 2023-06-12: qty 1

## 2023-06-12 MED ORDER — ONDANSETRON HCL 4 MG/2ML IJ SOLN
4.0000 mg | Freq: Four times a day (QID) | INTRAMUSCULAR | Status: DC | PRN
  Administered 2023-06-12 – 2023-06-13 (×3): 4 mg via INTRAVENOUS
  Filled 2023-06-12 (×3): qty 2

## 2023-06-12 MED ORDER — ROFLUMILAST 500 MCG PO TABS
500.0000 ug | ORAL_TABLET | Freq: Every morning | ORAL | Status: DC
  Administered 2023-06-12 – 2023-06-13 (×2): 500 ug via ORAL
  Filled 2023-06-12 (×2): qty 1

## 2023-06-12 MED ORDER — GABAPENTIN 400 MG PO CAPS
800.0000 mg | ORAL_CAPSULE | Freq: Four times a day (QID) | ORAL | Status: DC
  Administered 2023-06-12 – 2023-06-13 (×5): 800 mg via ORAL
  Filled 2023-06-12 (×5): qty 2

## 2023-06-12 MED ORDER — POTASSIUM CHLORIDE CRYS ER 20 MEQ PO TBCR
40.0000 meq | EXTENDED_RELEASE_TABLET | Freq: Once | ORAL | Status: AC
  Administered 2023-06-12: 40 meq via ORAL
  Filled 2023-06-12: qty 2

## 2023-06-12 NOTE — Progress Notes (Addendum)
Mobility Specialist Progress Note:   06/12/23 1101  Mobility  Activity Transferred to/from [facility]  Level of Assistance Minimal assist, patient does 75% or more  Assistive Device Front wheel walker  Distance Ambulated (ft) 3 ft  Activity Response Tolerated well  Mobility Referral Yes  Mobility visit 1 Mobility  Mobility Specialist Start Time (ACUTE ONLY) 1010  Mobility Specialist Stop Time (ACUTE ONLY) 1025  Mobility Specialist Time Calculation (min) (ACUTE ONLY) 15 min   Pt received EOB, requesting to use BR. Pt slightly impulsive needing MinA verbal cues for direction and guidance. Pt was able to stand and pivot to [facility] with CG assist. Displayed heavy anterior lean during ambulation. Pt was able to correct anterior lean but stated that its uncomfortable for her lower back to stand in upright posture. Void successful. Pt was able to perform pericare independently. Pt returned to bed with call bell in reach and all needs met. Bed alarm on. Chaplain present in room.   Leory Plowman  Mobility Specialist Please contact via [employer] office at [phone]

## 2023-06-12 NOTE — Progress Notes (Signed)
Called main pharmacy and spoke Darl Pikes, patient's Suboxone will be processed and sent to the unit.

## 2023-06-12 NOTE — Evaluation (Signed)
Physical Therapy Evaluation Patient Details Name: [name] MRN: [id number] DOB: Jan 26, 1951 Today's Date: 06/12/2023  History of Present Illness  Pt is a 73 yr old female who presented from home due to weakness. Chest x ray found multifocal pneumonia. PMH: HTN, hypothyroidism, depression, anxiety, chronic back pain, osteoarthritis.   Clinical Impression  Pt in bed upon arrival with family present and agreeable to PT eval. Pt presents with condition above and deficits mentioned below, see PT Problem List. PTA, pt was independent with no AD. However, pt reported increased weakness in the past week and needed to use a quad cane. Pt had multiple falls in the past week due to impaired balance. In today's session, pt was able to stand and ambulate~80 ft with MinA. Pt was slightly unsteady and needed MinA for RW management with cues for safety. Pt would d/c to her daughter's house with only 88 y.o. grandson at home during the day. Pt would benefit from acute skilled PT to address functional impairments. Recommending <3hrs post-acute to reduce falls risk and work towards independence with mobility. Pt is declining SNF at this time and would prefer to go home with HHPT. Acute PT to follow.           If plan is discharge home, recommend the following: A little help with walking and/or transfers;Help with stairs or ramp for entrance;Supervision due to cognitive status   Can travel by private vehicle   Yes    Equipment Recommendations Rolling walker (2 wheels)     Functional Status Assessment Patient has had a recent decline in their functional status and demonstrates the ability to make significant improvements in function in a reasonable and predictable amount of time.     Precautions / Restrictions Precautions Precautions: Fall Precaution/Restrictions Comments: on 2L at baseline Restrictions Weight Bearing Restrictions Per Provider Order: No      Mobility  Bed Mobility Overal bed  mobility: Needs Assistance Bed Mobility: Supine to Sit, Sit to Supine    Supine to sit: Contact guard Sit to supine: Contact guard assist   General bed mobility comments: CGA for safety    Transfers Overall transfer level: Needs assistance Equipment used: Rolling walker (2 wheels) Transfers: Sit to/from Stand, Bed to chair/wheelchair/BSC Sit to Stand: Min assist, Contact guard assist   Step pivot transfers: Min assist     General transfer comment: MinA to CGA for steadying assist when rising. Cues to keep RW close as pt tends to start standing without RW nearby. Slight anterior lean upon standing. Cues for hand placement    Ambulation/Gait Ambulation/Gait assistance: Min assist Gait Distance (Feet): 80 Feet Assistive device: Rolling walker (2 wheels) Gait Pattern/deviations: Step-through pattern, Trunk flexed, Shuffle Gait velocity: decr     General Gait Details: pt with significantly forward flexed trunk at baseline, slightly unsteady when ambulating with no overt LOB. MinA for RW management with cues to keep RW close.     Balance Overall balance assessment: Needs assistance Sitting-balance support: Feet supported, Bilateral upper extremity supported, Single extremity supported, No upper extremity supported Sitting balance-Leahy Scale: Fair   Postural control: Other (comment) (anterior lean) Standing balance support: Bilateral upper extremity supported, During functional activity, Reliant on assistive device for balance Standing balance-Leahy Scale: Poor Standing balance comment: reliant on UE support        Pertinent Vitals/Pain Pain Assessment Pain Assessment: Faces Faces Pain Scale: Hurts little more Pain Location: back pain Pain Descriptors / Indicators: Discomfort, Grimacing, Guarding Pain Intervention(s): Limited activity within  patient's tolerance, Monitored during session, Repositioned    Home Living Family/patient expects to be discharged to:: Private  residence Living Arrangements: Alone Available Help at Discharge: Family Type of Home: House Home Access: Stairs to enter Entrance Stairs-Rails: Can reach both;Left;Right Entrance Stairs-Number of Steps: 4   Home Layout: One level Home Equipment: Cane - quad;Shower seat      Prior Function Prior Level of Function : Independent/Modified Independent      Mobility Comments: Pt reported falling multiple times in the last week and started to use quad cane. Pt at baseline can not stand straight up. ADLs Comments: Pt reports indep and has two dogs     Extremity/Trunk Assessment   Upper Extremity Assessment Upper Extremity Assessment: Defer to OT evaluation    Lower Extremity Assessment Lower Extremity Assessment: Generalized weakness    Cervical / Trunk Assessment Cervical / Trunk Assessment: Kyphotic  Communication   Communication Communication: No apparent difficulties    Cognition Arousal: Alert Behavior During Therapy: WFL for tasks assessed/performed   PT - Cognitive impairments: No apparent impairments      Following commands: Intact (However, pt will often want to do it their way.)       Cueing Cueing Techniques: Verbal cues     General Comments General comments (skin integrity, edema, etc.): VSS on 2.5L, 3L when ambulating due to no 2.5 setting     PT Assessment Patient needs continued PT services  PT Problem List Decreased strength;Decreased activity tolerance;Decreased balance;Decreased mobility;Decreased knowledge of use of DME;Decreased safety awareness       PT Treatment Interventions DME instruction;Gait training;Stair training;Therapeutic activities;Functional mobility training;Neuromuscular re-education;Balance training;Therapeutic exercise;Patient/family education    PT Goals (Current goals can be found in the Care Plan section)  Acute Rehab PT Goals Patient Stated Goal: to go home PT Goal Formulation: With patient/family Time For Goal  Achievement: 06/26/23 Potential to Achieve Goals: Good    Frequency Min 1X/week        AM-PAC PT "6 Clicks" Mobility  Outcome Measure Help needed turning from your back to your side while in a flat bed without using bedrails?: A Little Help needed moving from lying on your back to sitting on the side of a flat bed without using bedrails?: A Little Help needed moving to and from a bed to a chair (including a wheelchair)?: A Little Help needed standing up from a chair using your arms (e.g., wheelchair or bedside chair)?: A Little Help needed to walk in hospital room?: A Little Help needed climbing 3-5 steps with a railing? : A Lot 6 Click Score: 17    End of Session Equipment Utilized During Treatment: Gait belt;Oxygen Activity Tolerance: Patient tolerated treatment well Patient left: in bed;with call bell/phone within reach;with bed alarm set;with family/visitor present Nurse Communication: Mobility status PT Visit Diagnosis: Unsteadiness on feet (R26.81);History of falling (Z91.81);Muscle weakness (generalized) (M62.81)    Time: 0981-1914 PT Time Calculation (min) (ACUTE ONLY): 31 min   Charges:   PT Evaluation $PT Eval Low Complexity: 1 Low PT Treatments $Gait Training: 8-22 mins PT General Charges $$ ACUTE PT VISIT: 1 Visit       Hilton Cork, PT, DPT Secure Chat Preferred  Rehab Office [phone]   Arturo Morton Brion Aliment 06/12/2023, 2:41 PM

## 2023-06-12 NOTE — Evaluation (Signed)
Occupational Therapy Evaluation Patient Details Name: [name] MRN: [id number] DOB: 04-Nov-1950 Today's Date: 06/12/2023   History of Present Illness   Pt is a 73 yr old female who presented from home due to weakness. Chest x ray found multifocal pneumonia. PMH: HTN, hypothyroidism, depression, anxiety, chronic back pain, osteoarthritis.     Clinical Impressions Pt reported that at PLOF they live alone with their dogs. She has had several falls prior to entering into the ED and attempted to start to use quad cane. She was able to complete transfers to [facility] with min assist with squat pivot transfer as she reported she need to go right now. Pt then required min to mod assist with hygiene while lateral and anterior/posterior leaning. Then completed stand step pivot to bed bed min assist with FW. She then reported started feel like she was going to vomit and requested medications. Pt was recommendation would benefit from continued inpatient follow up therapy, <3 hours/day, However, she reported she does not want at this time but was agreeable to to [facility] services. She was educated about the difference of HH versus SNF stay at this time.        If plan is discharge home, recommend the following:   A lot of help with walking and/or transfers;A lot of help with bathing/dressing/bathroom;Assistance with cooking/housework;Assist for transportation;Help with stairs or ramp for entrance     Functional Status Assessment   Patient has had a recent decline in their functional status and demonstrates the ability to make significant improvements in function in a reasonable and predictable amount of time.     Equipment Recommendations    (FW)     Recommendations for Other Services         Precautions/Restrictions   Precautions Precautions: Fall Restrictions Weight Bearing Restrictions Per Provider Order: No     Mobility Bed Mobility Overal bed mobility: Needs Assistance Bed  Mobility: Supine to Sit, Sit to Supine     Supine to sit: Contact guard Sit to supine: Contact guard assist        Transfers Overall transfer level: Needs assistance Equipment used: Rolling walker (2 wheels) Transfers: Sit to/from Stand Sit to Stand: Contact guard assist                  Balance Overall balance assessment: Needs assistance Sitting-balance support: Feet supported, Bilateral upper extremity supported, Single extremity supported, No upper extremity supported Sitting balance-Leahy Scale: Fair Sitting balance - Comments: Pt unaware of balance  as postural swaying in all directions Postural control: Posterior lean, Right lateral lean, Left lateral lean Standing balance support: Bilateral upper extremity supported Standing balance-Leahy Scale: Fair                             ADL either performed or assessed with clinical judgement   ADL Overall ADL's : Needs assistance/impaired Eating/Feeding: Minimal assistance;Sitting   Grooming: Wash/dry hands;Wash/dry face;Minimal assistance;Sitting   Upper Body Bathing: Contact guard assist;Minimal assistance;Sitting   Lower Body Bathing: Maximal assistance;Sit to/from stand   Upper Body Dressing : Contact guard assist;Minimal assistance;Sitting   Lower Body Dressing: Maximal assistance;Sit to/from stand   Toilet Transfer: Minimal assistance;Cueing for sequencing;Cueing for safety;Stand-pivot;Squat-pivot   Toileting- Clothing Manipulation and Hygiene: Moderate assistance;Sit to/from stand;Sitting/lateral lean       Functional mobility during ADLs: Minimal assistance;Rolling walker (2 wheels)       Vision Baseline Vision/History: 1 Wears glasses  Perception         Praxis         Pertinent Vitals/Pain Pain Assessment Pain Assessment: Faces Faces Pain Scale: Hurts whole lot Pain Location: back pain Pain Descriptors / Indicators: Discomfort, Grimacing, Guarding Pain  Intervention(s): Limited activity within patient's tolerance, Monitored during session, Repositioned, RN gave pain meds during session, Ice applied     Extremity/Trunk Assessment Upper Extremity Assessment Upper Extremity Assessment: Right hand dominant;RUE deficits/detail;LUE deficits/detail RUE Deficits / Details: decrease in B FM coordination wdue to increase in edema RUE Sensation: decreased light touch;history of peripheral neuropathy RUE Coordination: decreased fine motor LUE Deficits / Details: decrease in B FM coordination wdue to increase in edema LUE Sensation: decreased light touch;decreased proprioception LUE Coordination: decreased fine motor   Lower Extremity Assessment Lower Extremity Assessment: Defer to PT evaluation   Cervical / Trunk Assessment Cervical / Trunk Assessment: Kyphotic   Communication Communication Communication: No apparent difficulties   Cognition Arousal: Alert Behavior During Therapy: WFL for tasks assessed/performed               OT - Cognition Comments: noted decrease in saftey                 Following commands: Intact (However, pt will often want to do it their way.)       Cueing  General Comments   Cueing Techniques: Verbal cues      Exercises     Shoulder Instructions      Home Living Family/patient expects to be discharged to:: Private residence Living Arrangements: Alone Available Help at Discharge: Family Type of Home: House Home Access: Stairs to enter Secretary/administrator of Steps: 4 Entrance Stairs-Rails: Can reach both;Left;Right Home Layout: One level     Bathroom Shower/Tub: Chief Strategy Officer: Standard Bathroom Accessibility: Yes   Home Equipment: Cane - quad;Shower seat          Prior Functioning/Environment Prior Level of Function : Independent/Modified Independent             Mobility Comments: Pt reported falling multiple times in the last week and started to  use quad cane. Pt at baeline can not stand straight up. ADLs Comments: Pt reports indep and has two dogs    OT Problem List: Decreased strength;Decreased range of motion;Decreased activity tolerance;Impaired balance (sitting and/or standing);Decreased safety awareness;Decreased knowledge of use of DME or AE;Pain   OT Treatment/Interventions: Self-care/ADL training;Therapeutic exercise;Therapeutic activities;Patient/family education;Balance training      OT Goals(Current goals can be found in the care plan section)   Acute Rehab OT Goals Patient Stated Goal: to get moving OT Goal Formulation: With patient Time For Goal Achievement: 06/26/23 Potential to Achieve Goals: Good   OT Frequency:  Min 1X/week    Co-evaluation              AM-PAC OT "6 Clicks" Daily Activity     Outcome Measure Help from another person eating meals?: A Little Help from another person taking care of personal grooming?: A Little Help from another person toileting, which includes using toliet, bedpan, or urinal?: A Lot Help from another person bathing (including washing, rinsing, drying)?: A Lot Help from another person to put on and taking off regular upper body clothing?: A Little Help from another person to put on and taking off regular lower body clothing?: A Lot 6 Click Score: 15   End of Session Equipment Utilized During Treatment: Gait belt;Rolling walker (2 wheels) Nurse Communication: Mobility  status  Activity Tolerance: Patient limited by pain Patient left: in bed;with call bell/phone within reach;with bed alarm set  OT Visit Diagnosis: Unsteadiness on feet (R26.81);Other abnormalities of gait and mobility (R26.89);Repeated falls (R29.6);Muscle weakness (generalized) (M62.81);History of falling (Z91.81);Pain Pain - Right/Left:  (back)                Time: 0725-0820 OT Time Calculation (min): 55 min Charges:  OT General Charges $OT Visit: 1 Visit OT Evaluation $OT Eval Moderate  Complexity: 1 Mod OT Treatments $Self Care/Home Management : 38-52 mins  Presley Raddle OTR/L  Acute Rehab Services  [phone] office number   Alphia Moh 06/12/2023, 9:19 AM

## 2023-06-12 NOTE — Progress Notes (Signed)
[name]  ZOX:[id number] DOB: [birth year] DOA: 06/11/2023 PCP: Wilmer Floor., MD    Brief Narrative:  73 year old with a history of HTN, hypothyroidism, depression/anxiety, chronic back pain, OA, and recurrent bouts of pneumonia who presented to the ER 2/12 with generalized weakness beyond her baseline leading to 4-5 falls in the last week due to weakness in both arms and legs.  In the ER she was found to be mildly hypotensive and hypoxic requiring 2 L of nasal cannula to keep oxygen saturations at 90% or greater.  She was COVID flu and RSV negative.  CXR revealed multifocal pulmonary infiltrates.  Goals of Care:   Code Status: Full Code   DVT prophylaxis: enoxaparin (LOVENOX) injection 40 mg Start: 06/11/23 2200   Interim Hx: Blood pressure has improved with volume resuscitation.  Tmax is 99.6.  Vitals are otherwise stable.  She continues to require supplemental oxygen support.  She is very anxious.  She is fixated on her home medication regimen and resumption of all of her home meds making discussion of other medical issues difficult.  She denies chest pain but reports severe uncontrolled chronic back pain.  She does report shortness of breath particular with exertion.  Assessment & Plan:  Multifocal pneumonia -reported history of recurrent pneumonia Requiring supplemental O2 support -afebrile -COVID RSV and influenza negative -incentive spirometer -mobilize -wean oxygen as able  Hypotension with prior history of HTN Systolic blood pressure 80-90 at presentation -volume resuscitation administered with blood pressure now stable  Generalized weakness -chronic severe low back pain with degenerative spondylolisthesis Has undergone multiple prior back surgeries -on chronic Suboxone 3 times daily at home along with baclofen and gabapentin as well as oral diclofenac -has sustained several falls over the past week but CT head unremarkable this admission - continue usual home pain  regimen - PT/OT  Mild acute kidney injury Baseline creatinine approximately 0.8 -creatinine 1.34 at presentation in setting of hypotension and dehydration -resolved with volume expansion  Depression/anxiety Continue usual home fluoxetine, mirtazapine, and lorazepam  Hypothyroidism Continue usual home Synthroid dose  Mild hypokalemia Due to poor intake - supplement and follow   Family Communication: Family present at bedside during discussion/exam Disposition: Discharge home once able to be liberated from supplemental oxygen   Objective: Blood pressure 125/68, pulse 79, temperature 99.6 F (37.6 C), temperature source Oral, resp. rate 17, height 5\' 5"  (1.651 m), weight 58.5 kg, SpO2 99%.  Intake/Output Summary (Last 24 hours) at 06/12/2023 0856 Last data filed at 06/12/2023 0449 Gross per 24 hour  Intake 100 ml  Output 500 ml  Net -400 ml   Filed Weights   06/11/23 0935  Weight: 58.5 kg    Examination: General: No acute respiratory distress Lungs: Mild bibasilar crackles with no wheezing with good air movement throughout other fields Cardiovascular: Regular rate and rhythm without murmur gallop or rub normal S1 and S2 Abdomen: Nontender, nondistended, soft, bowel sounds positive, no rebound, no ascites, no appreciable mass Extremities: No significant cyanosis, clubbing, or edema bilateral lower extremities  CBC: Recent Labs  Lab 06/11/23 0937 06/12/23 0611  WBC 8.5 10.7*  NEUTROABS 7.6  --   HGB 11.1* 9.5*  HCT 32.8* 27.3*  MCV 95.3 93.8  PLT 280 199   Basic Metabolic Panel: Recent Labs  Lab 06/11/23 0937 06/12/23 0611  NA 135 137  K 3.5 3.3*  CL 104 107  CO2 21* 22  GLUCOSE 135* 112*  BUN 21 10  CREATININE 1.34* 0.69  CALCIUM 8.8*  8.3*  MG 1.4*  --    GFR: Estimated Creatinine Clearance: 58 mL/min (by C-G formula based on SCr of 0.69 mg/dL).   Scheduled Meds:  aspirin EC  81 mg Oral Daily   azithromycin  500 mg Oral Daily   baclofen  10 mg  Oral TID   buprenorphine-naloxone  1 tablet Sublingual TID   enoxaparin (LOVENOX) injection  40 mg Subcutaneous Q24H   feeding supplement  237 mL Oral BID BM   FLUoxetine  20 mg Oral QHS   gabapentin  800 mg Oral Q6H   levothyroxine  75 mcg Oral QAC breakfast   mirtazapine  45 mg Oral QHS   pneumococcal 20-valent conjugate vaccine  0.5 mL Intramuscular Tomorrow-1000   Continuous Infusions:  cefTRIAXone (ROCEPHIN)  IV       LOS: 0 days   Lonia Blood, MD [facility] Office  [phone] Pager - Text Page per Loretha Stapler  If 7PM-7AM, please contact night-coverage per Amion 06/12/2023, 8:56 AM

## 2023-06-12 NOTE — TOC Initial Note (Signed)
Transition of Care [facility]) - Initial/Assessment Note    Patient Details  Name: [name] MRN: [id number] Date of Birth: 07-11-1950  Transition of Care [facility]) CM/SW Contact:    Carley Hammed, LCSW Phone Number: 06/12/2023, 10:02 AM  Clinical Narrative:                  CSW met with pt at bedside to complete initial assessment. Pt with ongoing concerns about her health and treatment plan but notes she wants to get home as soon as possible. Pt declines SNF and states she lives alone, but will be going to the dtr's house. Pt states her dtr and grandchildren can help her, and she would like HH PT. CSW asked if dtr could be contacted to confirm plan and set up any services. Pt states dtr will be up this afternoon and would like to speak with her first.  Pt denies and substance use or safety concerns. TOC will continue to follow to confirm disposition with pt and family.  Expected Discharge Plan: Home w Home Health Services Barriers to Discharge: Continued Medical Work up   Patient Goals and CMS Choice Patient states their goals for this hospitalization and ongoing recovery are:: Pt wants to return home as soon as possible. CMS Medicare.gov Compare Post Acute Care list provided to:: Patient Choice offered to / list presented to : Patient      Expected Discharge Plan and Services In-house Referral: Clinical Social Work Discharge Planning Services: CM Consult Post Acute Care Choice: Home Health Living arrangements for the past 2 months: Single Family Home                                      Prior Living Arrangements/Services Living arrangements for the past 2 months: Single Family Home Lives with:: Self Patient language and need for interpreter reviewed:: Yes Do you feel safe going back to the place where you live?: Yes      Need for Family Participation in Patient Care: Yes (Comment) Care giver support system in place?: Yes (comment)   Criminal Activity/Legal  Involvement Pertinent to Current Situation/Hospitalization: No - Comment as needed  Activities of Daily Living   ADL Screening (condition at time of admission) Independently performs ADLs?: Yes (appropriate for developmental age) Is the patient deaf or have difficulty hearing?: Yes Does the patient have difficulty seeing, even when wearing glasses/contacts?: Yes Does the patient have difficulty concentrating, remembering, or making decisions?: Yes  Permission Sought/Granted Permission sought to share information with : Family Supports Permission granted to share information with : Yes, Verbal Permission Granted  Share Information with NAME: [name]     Permission granted to share info w Relationship: Daughter     Emotional Assessment Appearance:: Appears stated age Attitude/Demeanor/Rapport: Engaged Affect (typically observed): Anxious Orientation: : Oriented to Self, Oriented to Place, Oriented to  Time, Oriented to Situation Alcohol / Substance Use: Not Applicable Psych Involvement: No (comment)  Admission diagnosis:  Altered mental status, unspecified altered mental status type [R41.82] Multifocal pneumonia [J18.9] Pneumonia due to infectious organism, unspecified laterality, unspecified part of lung [J18.9] Patient Active Problem List   Diagnosis Date Noted   Multifocal pneumonia 06/11/2023   HCAP (healthcare-associated pneumonia)    Acute respiratory failure with hypoxia (HCC)    Weakness 02/28/2017   Degenerative spondylolisthesis 02/24/2017   SINUSITIS, RECURRENT 04/18/2009   HYPOTHYROIDISM 02/26/2008   ANXIETY 02/26/2008  DEPRESSION 02/26/2008   CHRONIC PAIN SYNDROME 02/26/2008   HYPERTENSION 02/26/2008   HERNIA 02/26/2008   UTI'S, RECURRENT 02/26/2008   OSTEOARTHRITIS 02/26/2008   DIARRHEA 02/26/2008   URINARY INCONTINENCE 02/26/2008   LAMINECTOMY, LUMBAR, HX OF 02/26/2008   PNEUMONIA, RECURRENT 02/25/2008   PCP:  Wilmer Floor., MD Pharmacy:    [facility] [phone] Rosalita Levan, [place] - [address] AT [facility] OF [address] [place] [address] Phone: [phone] Fax: [phone]  [facility] [address] [place] [address] Phone: [phone] Fax: [phone]     Social Drivers of Health (SDOH) Social History: SDOH Screenings   Food Insecurity: Food Insecurity Present (06/11/2023)  Housing: Low Risk  (06/11/2023)  Transportation Needs: No Transportation Needs (06/11/2023)  Utilities: Not At Risk (06/11/2023)  Social Connections: Moderately Integrated (06/11/2023)  Tobacco Use: Medium Risk (06/11/2023)   SDOH Interventions:     Readmission Risk Interventions     No data to display

## 2023-06-12 NOTE — Progress Notes (Signed)
Tried to wean O2 from 2L to 1L and patient refused.  She says she wears 2L at home baseline.  MD notified.

## 2023-06-12 NOTE — Progress Notes (Signed)
   06/12/23 1200  Spiritual Encounters  Type of Visit Initial  Care provided to: Patient  Referral source Patient request  Reason for visit Routine spiritual support  OnCall Visit No  Spiritual Framework  Presenting Themes Meaning/purpose/sources of inspiration;Goals in life/care;Values and beliefs;Significant life change  Community/Connection Family  Patient Stress Factors Health changes  Family Stress Factors Health changes  Interventions  Spiritual Care Interventions Made Established relationship of care and support;Compassionate presence;Reflective listening;Normalization of emotions  Intervention Outcomes  Outcomes Connection to spiritual care;Awareness around self/spiritual resourses;Connection to values and goals of care   Chaplain responded request for consult. Pt has shared her health problems. Major health changes had effected her emotionally. She mentioned that she had happy marriage life until 2007-07-08 her husband passed away, and now she is looking forward to meet with him. Chaplain listened attentively, provided spiritual support, and offered a prayer.  M.Kubra Delano Metz Resident [phone]

## 2023-06-12 NOTE — Plan of Care (Signed)

## 2023-06-12 NOTE — Plan of Care (Signed)
  Problem: Education: Goal: Knowledge of General Education information will improve Description: Including pain rating scale, medication(s)/side effects and non-pharmacologic comfort measures Outcome: Progressing   Problem: Health Behavior/Discharge Planning: Goal: Ability to manage health-related needs will improve Outcome: Progressing   Problem: Clinical Measurements: Goal: Ability to maintain clinical measurements within normal limits will improve Outcome: Progressing Goal: Will remain free from infection Outcome: Progressing Goal: Diagnostic test results will improve Outcome: Progressing Goal: Respiratory complications will improve Outcome: Progressing Goal: Cardiovascular complication will be avoided Outcome: Progressing   Problem: Pain Managment: Goal: General experience of comfort will improve and/or be controlled Outcome: Progressing   Problem: Elimination: Goal: Will not experience complications related to bowel motility Outcome: Progressing Goal: Will not experience complications related to urinary retention Outcome: Progressing

## 2023-06-12 NOTE — Progress Notes (Addendum)
Patient with all home medication in the room.  She was trying to take home misoprostol and diclofenac. RN educated patient about the medication policy and that we needed to take her medication to the pharmacy.  Daughter at bedside.  Suboxone x 4 films and medicine box given to RN. RN gave medication to charge nurse Carollee Herter to take to pharmacy.

## 2023-06-12 NOTE — Care Management Obs Status (Signed)
MEDICARE OBSERVATION STATUS NOTIFICATION   Patient Details  Name: ISIDORA LAHAM MRN: [id number] Date of Birth: 04/19/1951   Medicare Observation Status Notification Given:  Yes    Carley Hammed, LCSW 06/12/2023, 9:54 AM

## 2023-06-13 ENCOUNTER — Other Ambulatory Visit (HOSPITAL_COMMUNITY): Payer: Self-pay

## 2023-06-13 DIAGNOSIS — J189 Pneumonia, unspecified organism: Secondary | ICD-10-CM | POA: Diagnosis not present

## 2023-06-13 MED ORDER — ACIDOPHILUS PO CAPS
2.0000 | ORAL_CAPSULE | Freq: Every day | ORAL | 0 refills | Status: AC
  Filled 2023-06-13: qty 14, 7d supply, fill #0

## 2023-06-13 MED ORDER — SODIUM CHLORIDE 0.9 % IV SOLN: 1.0000 g | INTRAVENOUS | Status: DC

## 2023-06-13 MED ORDER — RISAQUAD PO CAPS
2.0000 | ORAL_CAPSULE | Freq: Every day | ORAL | Status: DC
  Administered 2023-06-13: 2 via ORAL
  Filled 2023-06-13: qty 2

## 2023-06-13 MED ORDER — SODIUM CHLORIDE 0.9 % IV SOLN
1.0000 g | INTRAVENOUS | Status: AC
  Administered 2023-06-13: 1 g via INTRAVENOUS
  Filled 2023-06-13: qty 10

## 2023-06-13 MED ORDER — CEFUROXIME AXETIL 250 MG PO TABS: 500.0000 mg | ORAL_TABLET | Freq: Two times a day (BID) | ORAL | Status: DC

## 2023-06-13 MED ORDER — CEFUROXIME AXETIL 500 MG PO TABS
500.0000 mg | ORAL_TABLET | Freq: Two times a day (BID) | ORAL | 0 refills | Status: AC
  Filled 2023-06-13: qty 8, 4d supply, fill #0

## 2023-06-13 MED ORDER — AZITHROMYCIN 500 MG PO TABS
500.0000 mg | ORAL_TABLET | Freq: Every day | ORAL | 0 refills | Status: AC
  Filled 2023-06-13: qty 2, 2d supply, fill #0

## 2023-06-13 NOTE — Progress Notes (Addendum)
Removed patient home med from [facility] and returrned to patient   2 units  Suboxone

## 2023-06-13 NOTE — Plan of Care (Signed)
Problem: Pain Managment: Goal: General experience of comfort will improve and/or be controlled Outcome: Progressing   Problem: Skin Integrity: Goal: Risk for impaired skin integrity will decrease Outcome: Progressing

## 2023-06-13 NOTE — TOC CM/SW Note (Cosign Needed)
    Durable Medical Equipment  (From admission, onward)           Start     Ordered   06/13/23 1134  For home use only DME Walker rolling  Once       Question Answer Comment  Walker: With 5 Inch Wheels   Patient needs a walker to treat with the following condition Weakness      06/13/23 1133   06/13/23 1134  For home use only DME standard manual wheelchair with seat cushion  Once       Comments: Patient suffers from weakness which impairs their ability to perform daily activities like ambulating  in the home.  A cane  will not resolve issue with performing activities of daily living. A wheelchair will allow patient to safely perform daily activities. Patient can safely propel the wheelchair in the home or has a caregiver who can provide assistance. Length of need lifetime . Accessories: elevating leg rests (ELRs), wheel locks, extensions and anti-tippers.  Seat and back cushions   06/13/23 1134

## 2023-06-13 NOTE — TOC Initial Note (Signed)
Transition of Care (TOC) - Initial/Assessment Note   Spoke to patient and daughter [name] at bedside.   Discussed PT recommendation for SNF with patient and daughter . Both voiced understanding. Patient wanting to go to Erin's home at discharge. Erin in agreement. Family can provide assistance.    Explained HHPT /OT only comes about twice a week at a time. They voiced understanding .   No Preference. Kandee Keen with [facility] accepted referral.   Erin's address is [address], [place], [place]   Patient requesting portable oxygen tank , walker and wheelchair. Patient has home oxygen with [employer] [facility]. Explained [employer] will discuss coverage of walker and wheelchair directly with patient. NCM called Mitch with [employer] for portable oxygen, walker and wheelchair. Mitch aware discharge is today.  Patient Details  Name: [name] MRN: [id number] Date of Birth: [birth year]  Transition of Care [facility]) CM/SW Contact:    Kingsley Plan, RN Phone Number: 06/13/2023, 11:45 AM  Clinical Narrative:                   Expected Discharge Plan: Home w Home Health Services Barriers to Discharge: Continued Medical Work up   Patient Goals and CMS Choice Patient states their goals for this hospitalization and ongoing recovery are:: To go to daughters home at discharge with HHPT CMS Medicare.gov Compare Post Acute Care list provided to:: Patient Choice offered to / list presented to : Patient      Expected Discharge Plan and Services In-house Referral: Clinical Social Work Discharge Planning Services: CM Consult Post Acute Care Choice: Home Health Living arrangements for the past 2 months: Single Family Home Expected Discharge Date: 06/13/23               DME Arranged: Oxygen, Walker rolling, Wheelchair manual DME Agency: [employer] Date DME Agency Contacted: 06/13/23 Time DME Agency Contacted: [phone] Representative spoke with at DME Agency: Mitch HH Arranged: PT, OT HH Agency: [facility] Date [facility] Agency Contacted: 06/13/23 Time HH Agency Contacted: 1143 Representative spoke with at [facility] Agency: Kandee Keen  Prior Living Arrangements/Services Living arrangements for the past 2 months: Single Family Home Lives with:: Self Patient language and need for interpreter reviewed:: Yes Do you feel safe going back to the place where you live?: Yes      Need for Family Participation in Patient Care: Yes (Comment) Care giver support system in place?: Yes (comment) Current home services: DME Criminal Activity/Legal Involvement Pertinent to Current Situation/Hospitalization: No - Comment as needed  Activities of Daily Living   ADL Screening (condition at time of admission) Independently performs ADLs?: Yes (appropriate for developmental age) Is the patient deaf or have difficulty hearing?: Yes Does the patient have difficulty seeing, even when wearing glasses/contacts?: Yes Does the patient have difficulty concentrating, remembering, or making decisions?: Yes  Permission Sought/Granted Permission sought to share information with : Family Supports Permission granted to share information with : Yes, Verbal Permission Granted  Share Information with NAME: daughter [name]  Permission granted to share info w AGENCY: [facility], [facility]  Permission granted to share info w Relationship: daughter     Emotional Assessment Appearance:: Appears stated age Attitude/Demeanor/Rapport: Engaged Affect (typically observed): Appropriate Orientation: : Oriented to Self, Oriented to Place, Oriented to  Time, Oriented to Situation Alcohol / Substance Use: Not Applicable Psych Involvement: No (comment)  Admission diagnosis:  Altered mental status, unspecified altered mental status type [R41.82] Multifocal pneumonia [J18.9] Pneumonia due to infectious organism, unspecified laterality,  unspecified part of lung [J18.9] Patient Active Problem List   Diagnosis Date Noted   Multifocal pneumonia  06/11/2023   HCAP (healthcare-associated pneumonia)    Acute respiratory failure with hypoxia (HCC)    Weakness 02/28/2017   Degenerative spondylolisthesis 02/24/2017   SINUSITIS, RECURRENT 04/18/2009   HYPOTHYROIDISM 02/26/2008   ANXIETY 02/26/2008   DEPRESSION 02/26/2008   CHRONIC PAIN SYNDROME 02/26/2008   HYPERTENSION 02/26/2008   HERNIA 02/26/2008   UTI'S, RECURRENT 02/26/2008   OSTEOARTHRITIS 02/26/2008   DIARRHEA 02/26/2008   URINARY INCONTINENCE 02/26/2008   LAMINECTOMY, LUMBAR, HX OF 02/26/2008   PNEUMONIA, RECURRENT 02/25/2008   PCP:  Wilmer Floor., MD Pharmacy:   [facility]ORE [phone] Rosalita Levan, [place] - [address] AT [facility] OF [address] [place] [address] Phone: [phone] Fax: [phone]  [facility] [address] [place] [address] Phone: [phone] Fax: [phone]     Social Drivers of Health (SDOH) Social History: SDOH Screenings   Food Insecurity: Food Insecurity Present (06/11/2023)  Housing: Low Risk  (06/11/2023)  Transportation Needs: No Transportation Needs (06/11/2023)  Utilities: Not At Risk (06/11/2023)  Social Connections: Moderately Integrated (06/11/2023)  Tobacco Use: Medium Risk (06/11/2023)   SDOH Interventions:     Readmission Risk Interventions     No data to display

## 2023-06-13 NOTE — Progress Notes (Signed)
PT Cancellation Note  Patient Details Name: [name] MRN: [id number] DOB: October 20, 1950   Cancelled Treatment:    Reason Eval/Treat Not Completed: (P) Pain limiting ability to participate;Patient declined, no reason specified (pt reports 8/10 pain, PTA offers to assist pt/family to review any mobility-related tasks she may need to perform prior to DC such as gait or stair training, or if they need gait belt for DC, pt/family defer, pt states she is just waiting for meds for DC)   [name] M Franklin Baumbach 06/13/2023, 2:12 PM

## 2023-06-13 NOTE — Progress Notes (Addendum)
DISCHARGE NOTE HOME [name] to be discharged Home per MD order. Discussed prescriptions and follow up appointments with the patient. Prescriptions given to patient; medication list explained in detail. Patient verbalized understanding.  Returned home medications to patient.  Skin clean, dry and intact without evidence of skin break down, no evidence of skin tears noted. IV catheter discontinued intact. Site without signs and symptoms of complications. Dressing and pressure applied. Pt denies pain at the site currently. No complaints noted.  Patient free of lines, drains, and wounds.   An After Visit Summary (AVS) was printed and given to the patient. Patient escorted via wheelchair, and discharged home via private auto.  Velia Meyer, RN

## 2023-06-13 NOTE — Progress Notes (Signed)
Mobility Specialist Progress Note:   06/13/23 1144  Mobility  Activity Ambulated with assistance in hallway  Level of Assistance Minimal assist, patient does 75% or more  Assistive Device Front wheel walker  Distance Ambulated (ft) 80 ft  Activity Response Tolerated well  Mobility Referral Yes  Mobility visit 1 Mobility  Mobility Specialist Start Time (ACUTE ONLY) 0940  Mobility Specialist Stop Time (ACUTE ONLY) 0955  Mobility Specialist Time Calculation (min) (ACUTE ONLY) 15 min   Pt received in bed, agreeable to mobility. Requesting to use BSC prior to ambulation. Void successful. Pt at  baseline with slightly forward lean but no unsteadiness present this session. Pt c/o slight leg weakness, otherwise asx throughout. Pt left in chair with call bell in reach and all needs met. Chair alarm on.  Leory Plowman  Mobility Specialist Please contact via [employer] Rehab office at [phone]

## 2023-06-13 NOTE — Plan of Care (Signed)

## 2023-06-13 NOTE — Discharge Summary (Signed)
DISCHARGE SUMMARY  [name]  MR#: [id number]  DOB:1951-03-28  Date of Admission: 06/11/2023 Date of Discharge: 06/13/2023  Attending Physician:Courtney Gallego Silvestre Gunner, MD  Patient's WUJ:[id number], [name]., MD  Disposition: Discharge home  Follow-up Appts:  Follow-up Information     Wilmer Floor., MD Follow up in 7 day(s).   Specialty: Internal Medicine Contact information: [address] [place] [address] [phone]                 Discharge Diagnoses: Multifocal pneumonia -reported history of recurrent pneumonia Hypotension with prior history of HTN Generalized weakness -chronic severe low back pain with degenerative spondylolisthesis Mild acute kidney injury Depression/anxiety Hypothyroidism Mild hypokalemia  Initial presentation: 73 year old with a history of HTN, hypothyroidism, depression/anxiety, chronic back pain, OA, and recurrent bouts of pneumonia who chronically requires 2 L nasal cannula oxygen support who presented to the ER 2/12 with generalized weakness beyond her baseline leading to 4-5 falls in the last week due to weakness in both arms and legs. In the ER she was found to be mildly hypotensive and hypoxic requiring 2 L of nasal cannula to keep oxygen saturations at 90% or greater. She was COVID flu and RSV negative. CXR revealed multifocal pulmonary infiltrates.   Hospital Course:  Multifocal pneumonia -reported history of recurrent pneumonia COVID RSV and influenza negative - incentive spirometer utilized  -mobilized - rapidly weaned back to her baseline 2L [place] O2 support prior to d/c - continue oral abx after d/c to compete a 7 day tx course - rapidly improved during this hospital stay, with IVF likely making the biggest impact on her tx course    Hypotension with prior history of HTN Systolic blood pressure 80-90 at presentation -volume resuscitation administered with blood pressure stable/elevated at time of  d/c resulting in significant symptomatic improvement   Generalized weakness - chronic severe low back pain with degenerative spondylolisthesis Has undergone multiple prior back surgeries -on chronic Suboxone 3 times daily at home along with baclofen and gabapentin as well as oral diclofenac -has sustained several falls over the past week but CT head unremarkable this admission - continue usual home pain regimen    Mild acute kidney injury Baseline creatinine approximately 0.8 -creatinine 1.34 at presentation in setting of hypotension and dehydration -resolved with volume expansion   Depression/anxiety Continue usual home fluoxetine, mirtazapine, and lorazepam   Hypothyroidism Continue usual home Synthroid dose   Mild hypokalemia Due to poor intake - supplement and follow  Allergies as of 06/13/2023       Reactions   Fentanyl Other (See Comments)   02-28-17 Pt reports that she does not have an allergy to this medication   Oxycontin [oxycodone Hcl] Other (See Comments)   Hallucinations , can take percocet with no issues         Medication List     TAKE these medications    acetaminophen 500 MG tablet Commonly known as: TYLENOL Take 2 tablets (1,000 mg total) every 8 (eight) hours as needed by mouth.   acidophilus Caps capsule Take 2 capsules by mouth daily for 7 days. Start taking on: June 14, 2023   albuterol 108 (90 Base) MCG/ACT inhaler Commonly known as: VENTOLIN HFA Inhale 1-2 puffs into the lungs every 6 (six) hours as needed for wheezing or shortness of breath.   aspirin EC 81 MG tablet Take 81 mg by mouth in the morning.   azithromycin 500 MG tablet Commonly known as: ZITHROMAX Take 1  tablet (500 mg total) by mouth daily for 2 days. Start taking on: June 14, 2023   baclofen 20 MG tablet Commonly known as: LIORESAL Take 20 mg by mouth every 8 (eight) hours.   buprenorphine-naloxone 8-2 mg Subl SL tablet Commonly known as: SUBOXONE Place 1 tablet  under the tongue in the morning, at noon, and at bedtime.   cefUROXime 500 MG tablet Commonly known as: CEFTIN Take 1 tablet (500 mg total) by mouth 2 (two) times daily with a meal for 4 days. Start taking on: June 14, 2023   Daliresp 500 MCG Tabs tablet Generic drug: roflumilast Take 500 mcg by mouth in the morning.   FLUoxetine 20 MG tablet Commonly known as: PROZAC Take 60 mg by mouth at bedtime.   gabapentin 800 MG tablet Commonly known as: NEURONTIN Take 800 mg by mouth every 6 (six) hours.   levothyroxine 75 MCG tablet Commonly known as: SYNTHROID Take 75 mcg by mouth daily before breakfast.   LORazepam 0.5 MG tablet Commonly known as: ATIVAN Take 0.5 mg by mouth 2 (two) times daily.   mirtazapine 45 MG tablet Commonly known as: REMERON Take 45 mg by mouth at bedtime.   nebivolol 2.5 MG tablet Commonly known as: BYSTOLIC Take 2.5 mg by mouth in the morning.   tolterodine 4 MG 24 hr capsule Commonly known as: DETROL LA Take 4 mg by mouth in the morning.               Durable Medical Equipment  (From admission, onward)           Start     Ordered   06/13/23 1134  For home use only DME Walker rolling  Once       Question Answer Comment  Walker: With 5 Inch Wheels   Patient needs a walker to treat with the following condition Weakness      06/13/23 1133   06/13/23 1134  For home use only DME standard manual wheelchair with seat cushion  Once       Comments: Patient suffers from weakness which impairs their ability to perform daily activities like ambulating  in the home.  A cane  will not resolve issue with performing activities of daily living. A wheelchair will allow patient to safely perform daily activities. Patient can safely propel the wheelchair in the home or has a caregiver who can provide assistance. Length of need lifetime . Accessories: elevating leg rests (ELRs), wheel locks, extensions and anti-tippers.  Seat and back cushions    06/13/23 1134            Day of Discharge BP (!) 153/90 (BP Location: Right Arm)   Pulse 82   Temp 98.4 F (36.9 C)   Resp 17   Ht 5\' 5"  (1.651 m)   Wt 58.5 kg   SpO2 100%   BMI 21.47 kg/m   Physical Exam: General: No acute respiratory distress Lungs: Clear to auscultation bilaterally without wheezes or crackles Cardiovascular: Regular rate and rhythm without murmur gallop or rub normal S1 and S2 Abdomen: Nontender, nondistended, soft, bowel sounds positive, no rebound, no ascites, no appreciable mass Extremities: No significant cyanosis, clubbing, or edema bilateral lower extremities  Basic Metabolic Panel: Recent Labs  Lab 06/11/23 0937 06/12/23 0611  NA 135 137  K 3.5 3.3*  CL 104 107  CO2 21* 22  GLUCOSE 135* 112*  BUN 21 10  CREATININE 1.34* 0.69  CALCIUM 8.8* 8.3*  MG 1.4*  --  CBC: Recent Labs  Lab 06/11/23 0937 06/12/23 0611  WBC 8.5 10.7*  NEUTROABS 7.6  --   HGB 11.1* 9.5*  HCT 32.8* 27.3*  MCV 95.3 93.8  PLT 280 199     Time spent in discharge (includes decision making & examination of pt): 35 minutes  06/13/2023, 11:39 AM   Lonia Blood, MD [facility] Office  [phone]

## 2023-06-16 LAB — CULTURE, BLOOD (ROUTINE X 2)
Culture: NO GROWTH
Culture: NO GROWTH

## 2023-10-09 ENCOUNTER — Other Ambulatory Visit (HOSPITAL_COMMUNITY): Payer: Self-pay | Admitting: Neurosurgery

## 2023-10-09 ENCOUNTER — Ambulatory Visit (HOSPITAL_COMMUNITY)
Admission: RE | Admit: 2023-10-09 | Discharge: 2023-10-09 | Disposition: A | Discharge status: Home / Self Care | Source: Ambulatory Visit | Attending: Neurosurgery | Admitting: Neurosurgery

## 2023-10-09 DIAGNOSIS — G992 Myelopathy in diseases classified elsewhere: Secondary | ICD-10-CM

## 2023-10-09 DIAGNOSIS — M4802 Spinal stenosis, cervical region: Secondary | ICD-10-CM | POA: Diagnosis present

## 2023-10-10 ENCOUNTER — Other Ambulatory Visit: Payer: Self-pay | Admitting: Neurosurgery

## 2023-10-13 NOTE — Progress Notes (Addendum)
 ---------  SDW INSTRUCTIONS given:   Your procedure is scheduled on October 14, 2023.             Report to [facility] Main Entrance A at 5:30 A.M., and check in at the Admitting office.             Call this number if you have problems the morning of surgery:             [phone]               Remember:             Do not eat or drink after midnight the night before your surgery                Take these medicines the morning of surgery with A SIP OF WATER  acetaminophen  (TYLENOL )  baclofen  (LIORESAL )  gabapentin  (NEURONTIN )  guaiFENesin   levothyroxine  (SYNTHROID , LEVOTHROID)  LORazepam  (ATIVAN )  nebivolol  (BYSTOLIC )  roflumilast  (DALIRESP )  tiotropium (SPIRIVA)  traMADol  (ULTRAM )    As of today, STOP taking any Aspirin  (unless otherwise instructed by your surgeon) Aleve, Naproxen, Ibuprofen, Motrin, Advil, Goody's, BC's, all herbal medications, fish oil, and all vitamins.                       Do not wear jewelry, make up, or nail polish            Do not wear lotions, powders, perfumes/colognes, or deodorant.            Do not shave 48 hours prior to surgery.  Men may shave face and neck.            Do not bring valuables to the hospital.            [facility] is not responsible for any belongings or valuables.   Do NOT Smoke (Tobacco/Vaping) 24 hours prior to your procedure If you use a CPAP at night, you may bring all equipment for your overnight stay.   Contacts, glasses, dentures or bridgework may not be worn into surgery.      For patients admitted to the hospital, discharge time will be determined by your treatment team.   Patients discharged the day of surgery will not be allowed to drive home, and someone needs to stay with them for 24 hours.       Special instructions:   [place]- Preparing For Surgery   Before surgery, you can play an important role. Because skin is not sterile, your skin needs to be as free of germs as possible. You can reduce the  number of germs on your skin by washing with CHG (chlorahexidine gluconate) Soap before surgery.  CHG is an antiseptic cleaner which kills germs and bonds with the skin to continue killing germs even after washing.     Oral Hygiene is also important to reduce your risk of infection.  Remember - BRUSH YOUR TEETH THE MORNING OF SURGERY WITH YOUR REGULAR TOOTHPASTE   Please do not use if you have an allergy to CHG or antibacterial soaps. If your skin becomes reddened/irritated stop using the CHG.  Do not shave (including legs and underarms) for at least 48 hours prior to first CHG shower. It is OK to shave your face.   Please follow these instructions carefully.              Shower the NIGHT BEFORE SURGERY and the MORNING OF SURGERY with  DIAL Soap.    Pat yourself dry with a CLEAN TOWEL.   Wear CLEAN PAJAMAS to bed the night before surgery   Place CLEAN SHEETS on your bed the night of your first shower and DO NOT SLEEP WITH PETS.     Day of Surgery: Please shower morning of surgery  Wear Clean/Comfortable clothing the morning of surgery Do not apply any deodorants/lotions.   Remember to brush your teeth WITH YOUR REGULAR TOOTHPASTE.   Questions were answered. Patient verbalized understanding of instructions.

## 2023-10-13 NOTE — Progress Notes (Signed)
 Received call from nurse at [facility] stating patient has taken her diclofenac  yesterday and medication now on hold. Dr. Lawyer Pride made aware.

## 2023-10-13 NOTE — Progress Notes (Signed)
 PCP - Alonso Jan., MD  Cardiologist -   PPM/ICD -  Device Orders -  Rep Notified -   Chest x-ray - 06-11-23 EKG - 06-11-23 Stress Test -  ECHO -  Cardiac Cath -   CPAP -   GLP-1 -  Fasting Blood Sugar -  Checks Blood Sugar /day  Blood Thinner Instructions:  Aspirin  Instructions:   ERAS Protcol -   COVID TEST-   Anesthesia review:   Patient verbally denies any shortness of breath, fever, cough and chest pain during phone call   -------------  SDW INSTRUCTIONS given:  Your procedure is scheduled on October 14, 2023.  Report to [facility] Main Entrance A at 5:30 A.M., and check in at the Admitting office.  Call this number if you have problems the morning of surgery:  [phone]   Remember:  Do not eat after midnight the night before your surgery  You may drink clear liquids until 4:30 the morning of your surgery.   Clear liquids allowed are: Water, Non-Citrus Juices (without pulp), Carbonated Beverages, Clear Tea, Black Coffee Only, and Gatorade    Take these medicines the morning of surgery with A SIP OF WATER  acetaminophen  (TYLENOL )  baclofen  (LIORESAL )  gabapentin  (NEURONTIN )  guaiFENesin   levothyroxine  (SYNTHROID , LEVOTHROID)  LORazepam  (ATIVAN )  nebivolol  (BYSTOLIC )  roflumilast  (DALIRESP )  tiotropium (SPIRIVA)  traMADol  (ULTRAM )   As of today, STOP taking any Aspirin  (unless otherwise instructed by your surgeon) Aleve, Naproxen, Ibuprofen, Motrin, Advil, Goody's, BC's, all herbal medications, fish oil, and all vitamins.                      Do not wear jewelry, make up, or nail polish            Do not wear lotions, powders, perfumes/colognes, or deodorant.            Do not shave 48 hours prior to surgery.  Men may shave face and neck.            Do not bring valuables to the hospital.            [facility] is not responsible for any belongings or valuables.  Do NOT Smoke (Tobacco/Vaping) 24 hours prior to your procedure If you use a  CPAP at night, you may bring all equipment for your overnight stay.   Contacts, glasses, dentures or bridgework may not be worn into surgery.      For patients admitted to the hospital, discharge time will be determined by your treatment team.   Patients discharged the day of surgery will not be allowed to drive home, and someone needs to stay with them for 24 hours.    Special instructions:   [place]- Preparing For Surgery  Before surgery, you can play an important role. Because skin is not sterile, your skin needs to be as free of germs as possible. You can reduce the number of germs on your skin by washing with CHG (chlorahexidine gluconate) Soap before surgery.  CHG is an antiseptic cleaner which kills germs and bonds with the skin to continue killing germs even after washing.    Oral Hygiene is also important to reduce your risk of infection.  Remember - BRUSH YOUR TEETH THE MORNING OF SURGERY WITH YOUR REGULAR TOOTHPASTE  Please do not use if you have an allergy to CHG or antibacterial soaps. If your skin becomes reddened/irritated stop using the CHG.  Do not shave (including legs  and underarms) for at least 48 hours prior to first CHG shower. It is OK to shave your face.  Please follow these instructions carefully.   Shower the NIGHT BEFORE SURGERY and the MORNING OF SURGERY with DIAL Soap.   Pat yourself dry with a CLEAN TOWEL.  Wear CLEAN PAJAMAS to bed the night before surgery  Place CLEAN SHEETS on your bed the night of your first shower and DO NOT SLEEP WITH PETS.   Day of Surgery: Please shower morning of surgery  Wear Clean/Comfortable clothing the morning of surgery Do not apply any deodorants/lotions.   Remember to brush your teeth WITH YOUR REGULAR TOOTHPASTE.   Questions were answered. Patient verbalized understanding of instructions.

## 2023-10-14 ENCOUNTER — Observation Stay (HOSPITAL_COMMUNITY)
Admission: RE | Admit: 2023-10-14 | Discharge: 2023-10-15 | Disposition: A | Discharge status: Home / Self Care | Attending: Neurosurgery | Admitting: Neurosurgery

## 2023-10-14 ENCOUNTER — Encounter (HOSPITAL_COMMUNITY): Payer: Self-pay | Admitting: Neurosurgery

## 2023-10-14 ENCOUNTER — Ambulatory Visit (HOSPITAL_COMMUNITY): Admitting: Anesthesiology

## 2023-10-14 ENCOUNTER — Encounter (HOSPITAL_COMMUNITY)
Admission: RE | Disposition: A | Discharge status: Home / Self Care | Payer: Self-pay | Source: Home / Self Care | Attending: Neurosurgery

## 2023-10-14 ENCOUNTER — Ambulatory Visit (HOSPITAL_COMMUNITY)

## 2023-10-14 ENCOUNTER — Other Ambulatory Visit: Payer: Self-pay

## 2023-10-14 DIAGNOSIS — Z87891 Personal history of nicotine dependence: Secondary | ICD-10-CM | POA: Insufficient documentation

## 2023-10-14 DIAGNOSIS — E039 Hypothyroidism, unspecified: Secondary | ICD-10-CM | POA: Diagnosis not present

## 2023-10-14 DIAGNOSIS — G959 Disease of spinal cord, unspecified: Principal | ICD-10-CM | POA: Diagnosis present

## 2023-10-14 DIAGNOSIS — I10 Essential (primary) hypertension: Secondary | ICD-10-CM | POA: Insufficient documentation

## 2023-10-14 DIAGNOSIS — G992 Myelopathy in diseases classified elsewhere: Secondary | ICD-10-CM

## 2023-10-14 DIAGNOSIS — M5001 Cervical disc disorder with myelopathy,  high cervical region: Secondary | ICD-10-CM | POA: Insufficient documentation

## 2023-10-14 DIAGNOSIS — M4802 Spinal stenosis, cervical region: Secondary | ICD-10-CM

## 2023-10-14 DIAGNOSIS — Z79899 Other long term (current) drug therapy: Secondary | ICD-10-CM | POA: Diagnosis not present

## 2023-10-14 DIAGNOSIS — J449 Chronic obstructive pulmonary disease, unspecified: Secondary | ICD-10-CM | POA: Diagnosis not present

## 2023-10-14 HISTORY — PX: ANTERIOR CERVICAL DECOMP/DISCECTOMY FUSION: SHX1161

## 2023-10-14 LAB — POCT I-STAT, CHEM 8
BUN: 17 mg/dL (ref 8–23)
Calcium, Ion: 1.22 mmol/L (ref 1.15–1.40)
Chloride: 98 mmol/L (ref 98–111)
Creatinine, Ser: 0.7 mg/dL (ref 0.44–1.00)
Glucose, Bld: 90 mg/dL (ref 70–99)
HCT: 35 % — ABNORMAL LOW (ref 36.0–46.0)
Hemoglobin: 11.9 g/dL — ABNORMAL LOW (ref 12.0–15.0)
Potassium: 4.6 mmol/L (ref 3.5–5.1)
Sodium: 136 mmol/L (ref 135–145)
TCO2: 29 mmol/L (ref 22–32)

## 2023-10-14 LAB — SURGICAL PCR SCREEN
MRSA, PCR: NEGATIVE
Staphylococcus aureus: NEGATIVE

## 2023-10-14 SURGERY — ANTERIOR CERVICAL DECOMPRESSION/DISCECTOMY FUSION 1 LEVEL
Anesthesia: General

## 2023-10-14 MED ORDER — PROPOFOL 10 MG/ML IV BOLUS
INTRAVENOUS | Status: AC
Start: 2023-10-14 — End: 2023-10-14
  Filled 2023-10-14: qty 20

## 2023-10-14 MED ORDER — CHLORHEXIDINE GLUCONATE 0.12 % MT SOLN
15.0000 mL | Freq: Once | OROMUCOSAL | Status: AC
  Administered 2023-10-14: 15 mL via OROMUCOSAL
  Filled 2023-10-14: qty 15

## 2023-10-14 MED ORDER — HYDROMORPHONE HCL 1 MG/ML IJ SOLN
0.5000 mg | INTRAMUSCULAR | Status: DC | PRN
  Administered 2023-10-14: 0.5 mg via INTRAVENOUS

## 2023-10-14 MED ORDER — HYDROMORPHONE HCL 1 MG/ML IJ SOLN
1.0000 mg | INTRAMUSCULAR | Status: DC | PRN
  Administered 2023-10-14: 1 mg via INTRAVENOUS
  Filled 2023-10-14: qty 1

## 2023-10-14 MED ORDER — LIDOCAINE 2% (20 MG/ML) 5 ML SYRINGE
INTRAMUSCULAR | Status: AC
Start: 2023-10-14 — End: 2023-10-14
  Filled 2023-10-14: qty 5

## 2023-10-14 MED ORDER — BUPRENORPHINE HCL-NALOXONE HCL 8-2 MG SL SUBL: 1.0000 | SUBLINGUAL_TABLET | Freq: Once | SUBLINGUAL | Status: DC

## 2023-10-14 MED ORDER — TRAMADOL HCL 50 MG PO TABS
100.0000 mg | ORAL_TABLET | Freq: Four times a day (QID) | ORAL | Status: DC | PRN
  Administered 2023-10-14 – 2023-10-15 (×3): 100 mg via ORAL
  Filled 2023-10-14 (×3): qty 2

## 2023-10-14 MED ORDER — INULIN 2 G PO CHEW: 2.0000 g | CHEWABLE_TABLET | Freq: Every day | ORAL | Status: DC

## 2023-10-14 MED ORDER — ONDANSETRON HCL 4 MG/2ML IJ SOLN
INTRAMUSCULAR | Status: AC
  Filled 2023-10-14: qty 2

## 2023-10-14 MED ORDER — GABAPENTIN 400 MG PO CAPS
800.0000 mg | ORAL_CAPSULE | Freq: Four times a day (QID) | ORAL | Status: DC
  Administered 2023-10-14 – 2023-10-15 (×4): 800 mg via ORAL
  Filled 2023-10-14 (×4): qty 2

## 2023-10-14 MED ORDER — HYDROCODONE-ACETAMINOPHEN 5-325 MG PO TABS: 1.0000 | ORAL_TABLET | ORAL | Status: DC | PRN

## 2023-10-14 MED ORDER — CYCLOBENZAPRINE HCL 10 MG PO TABS
10.0000 mg | ORAL_TABLET | Freq: Three times a day (TID) | ORAL | Status: DC | PRN
Start: 2023-10-14 — End: 2023-10-15
  Administered 2023-10-14: 10 mg via ORAL
  Filled 2023-10-14: qty 1

## 2023-10-14 MED ORDER — FLUOXETINE HCL 20 MG PO CAPS
60.0000 mg | ORAL_CAPSULE | Freq: Every day | ORAL | Status: DC
  Administered 2023-10-14: 60 mg via ORAL
  Filled 2023-10-14 (×2): qty 3

## 2023-10-14 MED ORDER — PROPOFOL 10 MG/ML IV BOLUS
INTRAVENOUS | Status: DC | PRN
  Administered 2023-10-14: 80 mg via INTRAVENOUS
  Administered 2023-10-14 (×2): 20 mg via INTRAVENOUS

## 2023-10-14 MED ORDER — ACETAMINOPHEN 10 MG/ML IV SOLN
1000.0000 mg | Freq: Once | INTRAVENOUS | Status: DC | PRN
  Administered 2023-10-14: 1000 mg via INTRAVENOUS

## 2023-10-14 MED ORDER — SODIUM CHLORIDE 0.9 % IV SOLN: 250.0000 mL | INTRAVENOUS | Status: DC

## 2023-10-14 MED ORDER — PANTOPRAZOLE SODIUM 40 MG PO TBEC
40.0000 mg | DELAYED_RELEASE_TABLET | Freq: Every day | ORAL | Status: DC
  Administered 2023-10-14: 40 mg via ORAL
  Filled 2023-10-14: qty 1

## 2023-10-14 MED ORDER — MIRTAZAPINE 30 MG PO TABS
45.0000 mg | ORAL_TABLET | Freq: Every day | ORAL | Status: DC
  Administered 2023-10-14: 45 mg via ORAL
  Filled 2023-10-14: qty 1

## 2023-10-14 MED ORDER — LEVOTHYROXINE SODIUM 100 MCG PO TABS
100.0000 ug | ORAL_TABLET | Freq: Every day | ORAL | Status: DC
  Administered 2023-10-14 – 2023-10-15 (×2): 100 ug via ORAL
  Filled 2023-10-14 (×2): qty 1

## 2023-10-14 MED ORDER — BACLOFEN 10 MG PO TABS
20.0000 mg | ORAL_TABLET | Freq: Three times a day (TID) | ORAL | Status: DC
  Administered 2023-10-14 (×3): 20 mg via ORAL
  Filled 2023-10-14 (×3): qty 2

## 2023-10-14 MED ORDER — ONDANSETRON HCL 4 MG/2ML IJ SOLN: 4.0000 mg | Freq: Four times a day (QID) | INTRAMUSCULAR | Status: DC | PRN

## 2023-10-14 MED ORDER — VITAMIN D 25 MCG (1000 UNIT) PO TABS
1000.0000 [IU] | ORAL_TABLET | Freq: Every day | ORAL | Status: DC
  Administered 2023-10-14: 1000 [IU] via ORAL
  Filled 2023-10-14: qty 1

## 2023-10-14 MED ORDER — DEXAMETHASONE SODIUM PHOSPHATE 10 MG/ML IJ SOLN
INTRAMUSCULAR | Status: AC
  Filled 2023-10-14: qty 1

## 2023-10-14 MED ORDER — TIOTROPIUM BROMIDE MONOHYDRATE 2.5 MCG/ACT IN AERS: 1.0000 | INHALATION_SPRAY | Freq: Every day | RESPIRATORY_TRACT | Status: DC

## 2023-10-14 MED ORDER — GUAIFENESIN 200 MG PO TABS: 400.0000 mg | ORAL_TABLET | ORAL | Status: DC | PRN

## 2023-10-14 MED ORDER — THROMBIN (RECOMBINANT) 5000 UNITS EX SOLR
CUTANEOUS | Status: DC | PRN
  Administered 2023-10-14: 10 mL via TOPICAL

## 2023-10-14 MED ORDER — SODIUM CHLORIDE 0.9% FLUSH: 3.0000 mL | INTRAVENOUS | Status: DC | PRN

## 2023-10-14 MED ORDER — TRIMETHOPRIM 100 MG PO TABS
100.0000 mg | ORAL_TABLET | Freq: Every day | ORAL | Status: DC
  Administered 2023-10-14: 100 mg via ORAL
  Filled 2023-10-14: qty 1

## 2023-10-14 MED ORDER — PHENOL 1.4 % MT LIQD: 1.0000 | OROMUCOSAL | Status: DC | PRN

## 2023-10-14 MED ORDER — DEXAMETHASONE SODIUM PHOSPHATE 10 MG/ML IJ SOLN
INTRAMUSCULAR | Status: DC | PRN
  Administered 2023-10-14: 10 mg via INTRAVENOUS

## 2023-10-14 MED ORDER — SODIUM CHLORIDE 0.9% FLUSH
3.0000 mL | Freq: Two times a day (BID) | INTRAVENOUS | Status: DC
  Administered 2023-10-14: 3 mL via INTRAVENOUS

## 2023-10-14 MED ORDER — TRAMADOL HCL 50 MG PO TABS: 100.0000 mg | ORAL_TABLET | Freq: Every day | ORAL | Status: DC

## 2023-10-14 MED ORDER — ORAL CARE MOUTH RINSE: 15.0000 mL | Freq: Once | OROMUCOSAL | Status: AC

## 2023-10-14 MED ORDER — UMECLIDINIUM BROMIDE 62.5 MCG/ACT IN AEPB
1.0000 | INHALATION_SPRAY | Freq: Every day | RESPIRATORY_TRACT | Status: DC
  Administered 2023-10-15: 1 via RESPIRATORY_TRACT
  Filled 2023-10-14: qty 7

## 2023-10-14 MED ORDER — THROMBIN 5000 UNITS EX KIT
PACK | CUTANEOUS | Status: AC
  Filled 2023-10-14: qty 2

## 2023-10-14 MED ORDER — FENTANYL CITRATE (PF) 250 MCG/5ML IJ SOLN
INTRAMUSCULAR | Status: DC | PRN
Start: 2023-10-14 — End: 2023-10-14
  Administered 2023-10-14 (×3): 50 ug via INTRAVENOUS

## 2023-10-14 MED ORDER — LIDOCAINE 2% (20 MG/ML) 5 ML SYRINGE
INTRAMUSCULAR | Status: DC | PRN
  Administered 2023-10-14: 60 mg via INTRAVENOUS

## 2023-10-14 MED ORDER — METOPROLOL TARTRATE 5 MG/5ML IV SOLN
INTRAVENOUS | Status: DC | PRN
  Administered 2023-10-14 (×2): 1 mg via INTRAVENOUS

## 2023-10-14 MED ORDER — MISOPROSTOL 200 MCG PO TABS
200.0000 ug | ORAL_TABLET | Freq: Four times a day (QID) | ORAL | Status: DC
  Administered 2023-10-14 – 2023-10-15 (×3): 200 ug via ORAL
  Filled 2023-10-14 (×5): qty 1

## 2023-10-14 MED ORDER — HYDROMORPHONE HCL 1 MG/ML IJ SOLN
INTRAMUSCULAR | Status: AC
  Filled 2023-10-14: qty 1

## 2023-10-14 MED ORDER — LACTATED RINGERS IV SOLN: INTRAVENOUS | Status: DC

## 2023-10-14 MED ORDER — ONDANSETRON HCL 4 MG/2ML IJ SOLN
INTRAMUSCULAR | Status: DC | PRN
  Administered 2023-10-14: 4 mg via INTRAVENOUS

## 2023-10-14 MED ORDER — CHLORHEXIDINE GLUCONATE CLOTH 2 % EX PADS: 6.0000 | MEDICATED_PAD | Freq: Once | CUTANEOUS | Status: DC

## 2023-10-14 MED ORDER — ROCURONIUM BROMIDE 10 MG/ML (PF) SYRINGE
PREFILLED_SYRINGE | INTRAVENOUS | Status: DC | PRN
  Administered 2023-10-14: 20 mg via INTRAVENOUS
  Administered 2023-10-14 (×2): 10 mg via INTRAVENOUS
  Administered 2023-10-14: 40 mg via INTRAVENOUS
  Administered 2023-10-14: 20 mg via INTRAVENOUS
  Administered 2023-10-14: 10 mg via INTRAVENOUS

## 2023-10-14 MED ORDER — OXYCODONE HCL 5 MG/5ML PO SOLN: 5.0000 mg | Freq: Once | ORAL | Status: DC | PRN

## 2023-10-14 MED ORDER — ACETAMINOPHEN 325 MG PO TABS: 650.0000 mg | ORAL_TABLET | ORAL | Status: DC | PRN

## 2023-10-14 MED ORDER — SUGAMMADEX SODIUM 200 MG/2ML IV SOLN
INTRAVENOUS | Status: DC | PRN
  Administered 2023-10-14: 125 mg via INTRAVENOUS

## 2023-10-14 MED ORDER — PHENYLEPHRINE 80 MCG/ML (10ML) SYRINGE FOR IV PUSH (FOR BLOOD PRESSURE SUPPORT)
PREFILLED_SYRINGE | INTRAVENOUS | Status: DC | PRN
  Administered 2023-10-14: 80 ug via INTRAVENOUS
  Administered 2023-10-14: 160 ug via INTRAVENOUS

## 2023-10-14 MED ORDER — HYDROMORPHONE HCL 1 MG/ML IJ SOLN
0.2500 mg | INTRAMUSCULAR | Status: DC | PRN
  Administered 2023-10-14 (×3): 0.5 mg via INTRAVENOUS

## 2023-10-14 MED ORDER — ROFLUMILAST 500 MCG PO TABS
500.0000 ug | ORAL_TABLET | Freq: Every day | ORAL | Status: DC
  Administered 2023-10-14: 500 ug via ORAL
  Filled 2023-10-14 (×2): qty 1

## 2023-10-14 MED ORDER — ROCURONIUM BROMIDE 10 MG/ML (PF) SYRINGE
PREFILLED_SYRINGE | INTRAVENOUS | Status: AC
Start: 2023-10-14 — End: 2023-10-14
  Filled 2023-10-14: qty 10

## 2023-10-14 MED ORDER — ONDANSETRON HCL 4 MG PO TABS: 4.0000 mg | ORAL_TABLET | Freq: Four times a day (QID) | ORAL | Status: DC | PRN

## 2023-10-14 MED ORDER — BUPRENORPHINE HCL-NALOXONE HCL 8-2 MG SL SUBL: 1.0000 | SUBLINGUAL_TABLET | Freq: Every day | SUBLINGUAL | Status: DC

## 2023-10-14 MED ORDER — BUPRENORPHINE HCL-NALOXONE HCL 8-2 MG SL SUBL
1.0000 | SUBLINGUAL_TABLET | Freq: Three times a day (TID) | SUBLINGUAL | Status: DC
  Administered 2023-10-14 – 2023-10-15 (×3): 1 via SUBLINGUAL
  Filled 2023-10-14 (×3): qty 1

## 2023-10-14 MED ORDER — SACCHAROMYCES BOULARDII 250 MG PO CAPS
250.0000 mg | ORAL_CAPSULE | Freq: Two times a day (BID) | ORAL | Status: DC
  Administered 2023-10-14: 250 mg via ORAL
  Filled 2023-10-14 (×3): qty 1

## 2023-10-14 MED ORDER — OXYCODONE HCL 5 MG PO TABS: 5.0000 mg | ORAL_TABLET | Freq: Once | ORAL | Status: DC | PRN

## 2023-10-14 MED ORDER — MENTHOL 3 MG MT LOZG: 1.0000 | LOZENGE | OROMUCOSAL | Status: DC | PRN

## 2023-10-14 MED ORDER — ACETAMINOPHEN 650 MG RE SUPP: 650.0000 mg | RECTAL | Status: DC | PRN

## 2023-10-14 MED ORDER — 0.9 % SODIUM CHLORIDE (POUR BTL) OPTIME
TOPICAL | Status: DC | PRN
  Administered 2023-10-14: 1000 mL

## 2023-10-14 MED ORDER — ACETAMINOPHEN 10 MG/ML IV SOLN
INTRAVENOUS | Status: AC
  Filled 2023-10-14: qty 100

## 2023-10-14 MED ORDER — ONDANSETRON HCL 4 MG/2ML IJ SOLN: 4.0000 mg | Freq: Once | INTRAMUSCULAR | Status: DC | PRN

## 2023-10-14 MED ORDER — BUPRENORPHINE HCL-NALOXONE HCL 8-2 MG SL SUBL
1.0000 | SUBLINGUAL_TABLET | Freq: Once | SUBLINGUAL | Status: AC
  Administered 2023-10-14: 1 via SUBLINGUAL
  Filled 2023-10-14: qty 1

## 2023-10-14 MED ORDER — FESOTERODINE FUMARATE ER 4 MG PO TB24
4.0000 mg | ORAL_TABLET | Freq: Every day | ORAL | Status: DC
  Administered 2023-10-14: 4 mg via ORAL
  Filled 2023-10-14 (×2): qty 1

## 2023-10-14 MED ORDER — MIDAZOLAM HCL 2 MG/2ML IJ SOLN
INTRAMUSCULAR | Status: AC
Start: 2023-10-14 — End: 2023-10-14
  Filled 2023-10-14: qty 2

## 2023-10-14 MED ORDER — CEFAZOLIN SODIUM-DEXTROSE 2-4 GM/100ML-% IV SOLN
2.0000 g | INTRAVENOUS | Status: AC
  Administered 2023-10-14: 2 g via INTRAVENOUS
  Filled 2023-10-14: qty 100

## 2023-10-14 MED ORDER — NEBIVOLOL HCL 2.5 MG PO TABS
2.5000 mg | ORAL_TABLET | Freq: Every day | ORAL | Status: DC
  Administered 2023-10-14: 2.5 mg via ORAL
  Filled 2023-10-14 (×2): qty 1

## 2023-10-14 MED ORDER — FENTANYL CITRATE (PF) 100 MCG/2ML IJ SOLN: 25.0000 ug | INTRAMUSCULAR | Status: DC | PRN

## 2023-10-14 MED ORDER — FENTANYL CITRATE (PF) 250 MCG/5ML IJ SOLN
INTRAMUSCULAR | Status: AC
  Filled 2023-10-14: qty 5

## 2023-10-14 MED ORDER — HYDROCODONE-ACETAMINOPHEN 10-325 MG PO TABS
2.0000 | ORAL_TABLET | ORAL | Status: DC | PRN
  Filled 2023-10-14: qty 2

## 2023-10-14 MED ORDER — MIDAZOLAM HCL 2 MG/2ML IJ SOLN
INTRAMUSCULAR | Status: DC | PRN
  Administered 2023-10-14: 2 mg via INTRAVENOUS

## 2023-10-14 MED ORDER — LORAZEPAM 0.5 MG PO TABS
0.5000 mg | ORAL_TABLET | Freq: Two times a day (BID) | ORAL | Status: DC
  Administered 2023-10-14 (×2): 0.5 mg via ORAL
  Filled 2023-10-14 (×2): qty 1

## 2023-10-14 MED ORDER — CEFAZOLIN SODIUM-DEXTROSE 1-4 GM/50ML-% IV SOLN
1.0000 g | Freq: Three times a day (TID) | INTRAVENOUS | Status: AC
  Administered 2023-10-14 (×2): 1 g via INTRAVENOUS
  Filled 2023-10-14 (×2): qty 50

## 2023-10-14 SURGICAL SUPPLY — 44 items
BAG COUNTER SPONGE SURGICOUNT (BAG) ×1 IMPLANT
BAND RUBBER #18 3X1/16 STRL (MISCELLANEOUS) ×2 IMPLANT
BENZOIN TINCTURE PRP APPL 2/3 (GAUZE/BANDAGES/DRESSINGS) ×1 IMPLANT
BIT DRILL 13 (BIT) IMPLANT
BUR MATCHSTICK NEURO 3.0 LAGG (BURR) ×1 IMPLANT
CAGE PEEK 8X14X11 (Cage) IMPLANT
CANISTER SUCTION 3000ML PPV (SUCTIONS) ×1 IMPLANT
DRAPE C-ARM 42X72 X-RAY (DRAPES) ×2 IMPLANT
DRAPE LAPAROTOMY 100X72 PEDS (DRAPES) ×1 IMPLANT
DRAPE MICROSCOPE SLANT 54X150 (MISCELLANEOUS) ×1 IMPLANT
DURAPREP 6ML APPLICATOR 50/CS (WOUND CARE) ×1 IMPLANT
ELECT COATED BLADE 2.86 ST (ELECTRODE) ×1 IMPLANT
ELECTRODE REM PT RTRN 9FT ADLT (ELECTROSURGICAL) ×1 IMPLANT
GAUZE 4X4 16PLY ~~LOC~~+RFID DBL (SPONGE) IMPLANT
GAUZE SPONGE 4X4 12PLY STRL (GAUZE/BANDAGES/DRESSINGS) ×1 IMPLANT
GLOVE BIOGEL PI IND STRL 7.5 (GLOVE) IMPLANT
GLOVE ECLIPSE 9.0 STRL (GLOVE) ×1 IMPLANT
GLOVE EXAM NITRILE XL STR (GLOVE) IMPLANT
GLOVE SURG SS PI 7.5 STRL IVOR (GLOVE) IMPLANT
GOWN STRL REUS W/ TWL LRG LVL3 (GOWN DISPOSABLE) IMPLANT
GOWN STRL REUS W/ TWL XL LVL3 (GOWN DISPOSABLE) IMPLANT
GOWN STRL REUS W/TWL 2XL LVL3 (GOWN DISPOSABLE) IMPLANT
HALTER HD/CHIN CERV TRACTION D (MISCELLANEOUS) ×1 IMPLANT
HEMOSTAT POWDER KIT SURGIFOAM (HEMOSTASIS) IMPLANT
KIT BASIN OR (CUSTOM PROCEDURE TRAY) ×1 IMPLANT
KIT TURNOVER KIT B (KITS) ×1 IMPLANT
NDL SPNL 20GX3.5 QUINCKE YW (NEEDLE) ×1 IMPLANT
NEEDLE SPNL 20GX3.5 QUINCKE YW (NEEDLE) ×1 IMPLANT
NS IRRIG 1000ML POUR BTL (IV SOLUTION) ×1 IMPLANT
PACK LAMINECTOMY NEURO (CUSTOM PROCEDURE TRAY) ×1 IMPLANT
PAD ARMBOARD POSITIONER FOAM (MISCELLANEOUS) ×3 IMPLANT
PLATE ELITE VISION 25MM (Plate) IMPLANT
SCREW ST 13X4XST VA NS SPNE (Screw) IMPLANT
SPONGE INTESTINAL PEANUT (DISPOSABLE) ×1 IMPLANT
SPONGE SURGIFOAM ABS GEL SZ50 (HEMOSTASIS) ×1 IMPLANT
STRIP CLOSURE SKIN 1/2X4 (GAUZE/BANDAGES/DRESSINGS) ×1 IMPLANT
SUT VIC AB 3-0 SH 8-18 (SUTURE) ×1 IMPLANT
SUT VIC AB 4-0 RB1 18 (SUTURE) ×1 IMPLANT
TAPE CLOTH 4X10 WHT NS (GAUZE/BANDAGES/DRESSINGS) ×1 IMPLANT
TAPE CLOTH SURG 4X10 WHT LF (GAUZE/BANDAGES/DRESSINGS) IMPLANT
TOWEL GREEN STERILE (TOWEL DISPOSABLE) ×1 IMPLANT
TOWEL GREEN STERILE FF (TOWEL DISPOSABLE) ×1 IMPLANT
TRAP SPECIMEN MUCUS 40CC (MISCELLANEOUS) ×1 IMPLANT
WATER STERILE IRR 1000ML POUR (IV SOLUTION) ×1 IMPLANT

## 2023-10-14 NOTE — Brief Op Note (Signed)
 10/14/2023  9:46 AM  PATIENT:  [name]  73 y.o. female  PRE-OPERATIVE DIAGNOSIS:  Stenosis of cervical spine with myelopathy  POST-OPERATIVE DIAGNOSIS:  Stenosis of cervical spine with myelopathy  PROCEDURE:  Procedure(s) with comments: ANTERIOR CERVICAL DECOMPRESSION/DISCECTOMY FUSION 1 LEVEL (N/A) - ACDF - C3-C4  SURGEON:  Surgeons and Role:    Agustina Aldrich, MD - Primary  PHYSICIAN ASSISTANT:   ASSISTANTSAlena An   ANESTHESIA:   general  EBL:  25 mL   BLOOD ADMINISTERED:none  DRAINS: none   LOCAL MEDICATIONS USED:  NONE  SPECIMEN:  No Specimen  DISPOSITION OF SPECIMEN:  N/A  COUNTS:  YES  TOURNIQUET:  * No tourniquets in log *  DICTATION: .Dragon Dictation  PLAN OF CARE: Admit for overnight observation  PATIENT DISPOSITION:  PACU - hemodynamically stable.   Delay start of Pharmacological VTE agent (>24hrs) due to surgical blood loss or risk of bleeding: yes

## 2023-10-14 NOTE — Progress Notes (Signed)
 Orthopedic Tech Progress Note Patient Details:  [name] 1950-08-23 [id number]  Ortho Devices Type of Ortho Device: Soft collar Ortho Device/Splint Location: Neck Ortho Device/Splint Interventions: Ordered      Zyair Rhein A Idrissa Beville 10/14/2023, 10:31 AM

## 2023-10-14 NOTE — Anesthesia Preprocedure Evaluation (Addendum)
 Anesthesia Evaluation  Patient identified by MRN, date of birth, ID band Patient awake    Reviewed: Allergy & Precautions, NPO status , Patient's Chart, lab work & pertinent test results, reviewed documented beta blocker date and time   History of Anesthesia Complications Negative for: history of anesthetic complications  Airway Mallampati: II  TM Distance: >3 FB     Dental no notable dental hx.    Pulmonary shortness of breath and with exertion, pneumonia, resolved, COPD,  COPD inhaler, former smoker   breath sounds clear to auscultation       Cardiovascular hypertension, (-) angina (-) CAD, (-) Past MI and (-) Cardiac Stents  Rhythm:Regular Rate:Normal     Neuro/Psych  Headaches PSYCHIATRIC DISORDERS Anxiety Depression       GI/Hepatic ,GERD  ,,(+) neg Cirrhosis        Endo/Other  Hypothyroidism    Renal/GU Renal disease     Musculoskeletal  (+) Arthritis , Osteoarthritis,    Abdominal   Peds  Hematology   Anesthesia Other Findings   Reproductive/Obstetrics                              Anesthesia Physical Anesthesia Plan  ASA: 3  Anesthesia Plan: General   Post-op Pain Management: Ketamine IV*   Induction: Intravenous  PONV Risk Score and Plan: 2 and Ondansetron  and Dexamethasone   Airway Management Planned: Oral ETT and Video Laryngoscope Planned  Additional Equipment:   Intra-op Plan:   Post-operative Plan: Extubation in OR  Informed Consent: I have reviewed the patients History and Physical, chart, labs and discussed the procedure including the risks, benefits and alternatives for the proposed anesthesia with the patient or authorized representative who has indicated his/her understanding and acceptance.     Dental advisory given  Plan Discussed with: CRNA  Anesthesia Plan Comments:          Anesthesia Quick Evaluation

## 2023-10-14 NOTE — Op Note (Signed)
 Date of procedure: 10/14/2023  Date of dictation: Same  Service: Neurosurgery  Preoperative diagnosis: Cervical stenosis with myelopathy  Postoperative diagnosis: Same  Procedure Name: C3-4 anterior cervical discectomy and fusion utilizing interbody cage, local harvested autograft, anterior plate instrumentation  Surgeon:Okla Qazi A.Marie Borowski, M.D.  Asst. Surgeon: Sabra Cramp, NP  Anesthesia: General  Indication: 73 year old female with severe cervical stenosis and myelopathy with severe bilateral quadriparesis and spasticity.  Workup demonstrates evidence of severe cervical stenosis with cord compression and cord signal change at C3-4.  Patient presents now for anterior cervical decompression and fusion in hopes of improving her symptoms.  Operative note: After induction of anesthesia, patient positioned supine with neck slightly extended and held placeholder traction.  Patient's anterior cervical region prepped draped sterilely.  Incision made overlying C3-4.  Dissection performed on the right.  Retractor placed.  Fluoroscopy used.  Level confirmed.  Disc base incised.  Discectomy performed using various instruments down to level of the posterior annulus.  Microscope was then brought to the field used throughout the remainder of the discectomy remaining aspects of annulus and osteophytes removed using high-speed drill down to level the posterior logical ligament.  Posterior logical was elevated and resected in a piecemeal fashion.  Underlying thecal sac was identified.  A wide central decompression was then performed undercutting the bodies of C3 and C4.  Decompression then proceeded each neural foramina.  Wide anterior foraminotomies performed on the course exiting C4 nerve roots bilaterally.  At this point a very thorough decompression was achieved.  There was no evidence of injury to the thecal sac or nerve roots.  Gelfoam was placed topically for hemostasis then removed.  8 mm Medtronic anatomic peek  cage was then packed with locally harvested autograft.  Cages then packed into place and recessed slightly from the anterior cortical margin.  25 mm Atlantis anterior cervical plate was then placed over the C3 and C4 level.  This then attached under fluoroscopic guidance using 13 mm variable angle screws to each in both C3 and C4.  Final tightening was performed with good purchase.  Locking screws were engaged at both levels.  Final images reveal good position the cages and the hardware at the proper level with normal alignment of the spine.  Wound was then irrigated.  Hemostasis was inspected and found to be good.  Wound was then closed in layers with Vicryl sutures.  Steri-Strips and sterile dressing were applied.  No apparent complications.  Patient tolerated the procedure well and she returns to the recovery room postop.

## 2023-10-14 NOTE — Anesthesia Postprocedure Evaluation (Signed)
 Anesthesia Post Note  Patient: [name]  Procedure(s) Performed: ANTERIOR CERVICAL DECOMPRESSION/DISCECTOMY FUSION CERVICAL THREE-CERVICAL FOUR     Patient location during evaluation: PACU Anesthesia Type: General Level of consciousness: awake and alert Pain management: pain level controlled Vital Signs Assessment: post-procedure vital signs reviewed and stable Respiratory status: spontaneous breathing, nonlabored ventilation, respiratory function stable and patient connected to nasal cannula oxygen  Cardiovascular status: blood pressure returned to baseline and stable Postop Assessment: no apparent nausea or vomiting Anesthetic complications: no   No notable events documented.  Last Vitals:  Vitals:   10/14/23 1030 10/14/23 1107  BP: (!) 170/90 (!) 165/87  Pulse: 69 75  Resp: 14 20  Temp: 36.6 C 36.7 C  SpO2: 97% 99%    Last Pain:  Vitals:   10/14/23 1144  TempSrc:   PainSc: 9    Pain Goal: Patients Stated Pain Goal: 3 (10/14/23 1115)                 Leslye Rast

## 2023-10-14 NOTE — H&P (Signed)
 [name] is an 73 y.o. female.   Chief Complaint: Weakness HPI: 73 year old female with progressive bilateral upper and lower extremity numbness paresthesias and weakness with increasing spasticity and loss of dexterity.  Workup demonstrates evidence of severe cervical stenosis with marked cord compression and cord signal abnormality at C3-4.  Patient also with some spondylosis and early stenosis at C4-5 and C5-6 but no frank cord compression at these levels.  Patient presents now for C3-4 anterior cervical discectomy and fusion in hopes of improving her symptoms.  Past Medical History:  Diagnosis Date   Anxiety    Chronic pain    Depression    Dyspnea    with  URI   GERD (gastroesophageal reflux disease)    Headache(784.0)    hx. migraines   Hypertension    Hypothyroidism    Osteoarthritis    Recurrent pneumonia    Urinary incontinence     Past Surgical History:  Procedure Laterality Date   ACHILLES TENDON REPAIR     HERNIA REPAIR     LUMBAR FUSION     LUMBAR LAMINECTOMY     nerve stimulator insertion and subsequent removal     TUBAL LIGATION      Family History  Problem Relation Age of Onset   Breast cancer Mother    Heart disease Father    Social History:  reports that she quit smoking about 19 years ago. Her smoking use included cigarettes. She started smoking about 34 years ago. She has a 15 pack-year smoking history. She has never used smokeless tobacco. She reports that she does not drink alcohol and does not use drugs.  Allergies:  Allergies  Allergen Reactions   Fentanyl  Other (See Comments)    02-28-17 Pt reports that she does not have an allergy to this medication   Oxycontin  [Oxycodone  Hcl] Other (See Comments)    Hallucinations , can take percocet with no issues     Medications Prior to Admission  Medication Sig Dispense Refill   acetaminophen  (TYLENOL ) 500 MG tablet Take 2 tablets (1,000 mg total) every 8 (eight) hours as needed by mouth.  (Patient taking differently: Take 1,000 mg by mouth every 6 (six) hours as needed for moderate pain (pain score 4-6).) 30 tablet 0   baclofen  (LIORESAL ) 20 MG tablet Take 20 mg by mouth 3 (three) times daily.     Buprenorphine  HCl-Naloxone  HCl (SUBOXONE ) 8-2 MG FILM Place 1 Film under the tongue 3 (three) times daily.     cholecalciferol (VITAMIN D3) 25 MCG (1000 UNIT) tablet Take 1,000 Units by mouth daily.     diclofenac  (VOLTAREN ) 50 MG EC tablet Take 50 mg by mouth every 6 (six) hours.     FLUoxetine  (PROZAC ) 20 MG tablet Take 60 mg by mouth daily.     gabapentin  (NEURONTIN ) 800 MG tablet Take 800 mg by mouth every 6 (six) hours.     Inulin 2 g CHEW Chew 2 g by mouth daily.     levothyroxine  (SYNTHROID ) 100 MCG tablet Take 100 mcg by mouth daily.     LORazepam  (ATIVAN ) 0.5 MG tablet Take 0.5 mg by mouth 2 (two) times daily.     mirtazapine  (REMERON ) 45 MG tablet Take 45 mg by mouth at bedtime.       misoprostol  (CYTOTEC ) 200 MCG tablet Take 200 mcg by mouth 4 (four) times daily.     nebivolol  (BYSTOLIC ) 2.5 MG tablet Take 2.5 mg by mouth daily.     omeprazole (PRILOSEC) 20  MG capsule Take 20 mg by mouth daily as needed (heartburn/indigestion).     roflumilast  (DALIRESP ) 500 MCG TABS tablet Take 500 mcg by mouth daily.     saccharomyces boulardii (FLORASTOR) 250 MG capsule Take 250 mg by mouth 2 (two) times daily.     Tiotropium Bromide Monohydrate (SPIRIVA RESPIMAT) 2.5 MCG/ACT AERS Inhale 1 puff into the lungs daily.     tolterodine (DETROL LA) 4 MG 24 hr capsule Take 4 mg by mouth daily.     trimethoprim  (TRIMPEX ) 100 MG tablet Take 100 mg by mouth at bedtime.     guaifenesin  (HUMIBID E) 400 MG TABS tablet Take 400 mg by mouth every 4 (four) hours as needed (cough).     traMADol  (ULTRAM ) 50 MG tablet Take 50 mg by mouth every 8 (eight) hours.      Results for orders placed or performed during the hospital encounter of 10/14/23 (from the past 48 hours)  I-STAT, chem 8     Status:  Abnormal   Collection Time: 10/14/23  7:21 AM  Result Value Ref Range   Sodium 136 135 - 145 mmol/L   Potassium 4.6 3.5 - 5.1 mmol/L   Chloride 98 98 - 111 mmol/L   BUN 17 8 - 23 mg/dL   Creatinine, Ser 2.44 0.44 - 1.00 mg/dL   Glucose, Bld 90 70 - 99 mg/dL    Comment: Glucose reference range applies only to samples taken after fasting for at least 8 hours.   Calcium, Ion 1.22 1.15 - 1.40 mmol/L   TCO2 29 22 - 32 mmol/L   Hemoglobin 11.9 (L) 12.0 - 15.0 g/dL   HCT 01.0 (L) 27.2 - 53.6 %   No results found.  Pertinent items noted in HPI and remainder of comprehensive ROS otherwise negative.  Blood pressure (!) 145/82, pulse 68, temperature 98.1 F (36.7 C), temperature source Oral, resp. rate 18, height 5' 5 (1.651 m), weight 54.4 kg, SpO2 96%.  Patient is awake and alert.  She is oriented and appropriate.  Speech is fluent.  Judgment insight are intact.  Cranial nerve function normal bilateral.  Motor examination reveals 4/5 weakness in both upper extremities with increased muscle tone and spasticity.  She has 4/5 strength in both lower extremities with increased muscle tone and spasticity.  Sensory examination with decrease sensation from C4 distally.  She is hyperreflexic with clonus and Hoffmann's responses in both hands.  Examination head ears eyes nose and throat is unremarked.  Chest and abdomen are otherwise benign.  Extremities are free from injury deformity. Assessment/Plan C3-4 stenosis with myelopathy.  Plan C3-4 anterior cervical discectomy and fusion utilizing interbody cage, local harvested autograft, and anterior plate instrumentation.  Risks and benefits been explained.  Patient wishes to proceed.  [name] 10/14/2023, 7:52 AM

## 2023-10-14 NOTE — Evaluation (Signed)
 Physical Therapy Evaluation Patient Details Name: [name] MRN: [id number] DOB: [birth year] Today's Date: 10/14/2023  History of Present Illness  Pt is a 73 yr old female admitted 10/14/23 for ACDF C3-4 due to severe stenosis with cord compression.  PMH positive for HTN, hypothyroidism, depression, anxiety, chronic back pain, osteoarthritis GERD, recurrent pneumonia.  Clinical Impression  Patient presents with decreased mobility due to new cervical precautions with cervical collar, decreased standing balance, activity tolerance and generalized weakness.  Normally ambulatory with platform rolling walker at rehab though states now can grip better and open hands better than PTA.  Gets help with ADL's and ambulation.  Feel she will benefit from continued skilled PT while inpatient and from post-acute inpatient rehab (< 3 hours/day) at d/c.       If plan is discharge home, recommend the following: A little help with walking and/or transfers;Assistance with cooking/housework;Assist for transportation;Help with stairs or ramp for entrance;A little help with bathing/dressing/bathroom   Can travel by private vehicle   Yes    Equipment Recommendations None recommended by PT  Recommendations for Other Services       Functional Status Assessment Patient has had a recent decline in their functional status and demonstrates the ability to make significant improvements in function in a reasonable and predictable amount of time.     Precautions / Restrictions Precautions Precautions: Fall;Cervical Precaution Booklet Issued: Yes (comment) Recall of Precautions/Restrictions: Intact Required Braces or Orthoses: Cervical Brace Cervical Brace: Soft collar;For comfort      Mobility  Bed Mobility               General bed mobility comments: up in recliner    Transfers Overall transfer level: Needs assistance Equipment used: Bilateral platform walker Transfers: Sit to/from Stand Sit  to Stand: Min assist           General transfer comment: assist for balance and anterior weight shift (pt stated I could have done it on my own)    Ambulation/Gait Ambulation/Gait assistance: Contact guard assist, Min assist Gait Distance (Feet): 70 Feet Assistive device: Bilateral platform walker Gait Pattern/deviations: Step-to pattern, Trunk flexed, Shuffle, Decreased stride length       General Gait Details: short shuffling steps with vaulting on L to progress R pt c/o felt as if someing on L heel; side stepping in narrow spot to get around bed with A for balance and walker management; has her walker from rehab with her  Stairs            Wheelchair Mobility     Tilt Bed    Modified Rankin (Stroke Patients Only)       Balance Overall balance assessment: Needs assistance   Sitting balance-Leahy Scale: Good     Standing balance support: Bilateral upper extremity supported, During functional activity Standing balance-Leahy Scale: Poor Standing balance comment: UE support on PFRW for balance                             Pertinent Vitals/Pain Pain Assessment Pain Assessment: Faces Faces Pain Scale: Hurts little more Pain Location: neck from surgery Pain Descriptors / Indicators: Sore Pain Intervention(s): Monitored during session, Repositioned, Ice applied    Home Living Family/patient expects to be discharged to:: Skilled nursing facility                   Additional Comments: from [facility] and eager to return    Prior  Function Prior Level of Function : Needs assist  Cognitive Assist : Mobility (cognitive)           Mobility Comments: was getting help to walk with platform RW ADLs Comments: A for hygiene/ADL's     Extremity/Trunk Assessment   Upper Extremity Assessment Upper Extremity Assessment: LUE deficits/detail;RUE deficits/detail RUE Deficits / Details: limited AROM with prior shoulder pathology with cervical  stenosis, hands can straighten (which she says is new hands were clawed before) RUE Sensation: decreased light touch (numbness PTA but improving) LUE Deficits / Details: limited AROM with prior shoulder pathology with cervical stenosis, hands can straighten (which she says is new hands were clawed before) LUE Sensation: decreased light touch (numbness PTA but improving)         Cervical / Trunk Assessment Cervical / Trunk Assessment: Kyphotic;Neck Surgery  Communication   Communication Communication: No apparent difficulties    Cognition Arousal: Alert Behavior During Therapy: WFL for tasks assessed/performed   PT - Cognitive impairments: No apparent impairments                         Following commands: Intact       Cueing Cueing Techniques: Verbal cues     General Comments General comments (skin integrity, edema, etc.): daughter in the room and supportive; donned her shoes for her in sitting    Exercises     Assessment/Plan    PT Assessment Patient needs continued PT services  PT Problem List Decreased activity tolerance;Decreased mobility;Decreased balance;Pain       PT Treatment Interventions DME instruction;Functional mobility training;Balance training;Patient/family education;Gait training;Therapeutic activities;Therapeutic exercise    PT Goals (Current goals can be found in the Care Plan section)  Acute Rehab PT Goals Patient Stated Goal: return to rehab PT Goal Formulation: With patient/family Time For Goal Achievement: 10/21/23 Potential to Achieve Goals: Good    Frequency Min 3X/week     Co-evaluation               AM-PAC PT 6 Clicks Mobility  Outcome Measure Help needed turning from your back to your side while in a flat bed without using bedrails?: A Little Help needed moving from lying on your back to sitting on the side of a flat bed without using bedrails?: A Little Help needed moving to and from a bed to a chair (including  a wheelchair)?: A Little Help needed standing up from a chair using your arms (e.g., wheelchair or bedside chair)?: A Little Help needed to walk in hospital room?: A Little Help needed climbing 3-5 steps with a railing? : Total 6 Click Score: 16    End of Session Equipment Utilized During Treatment: Gait belt;Cervical collar Activity Tolerance: Patient tolerated treatment well Patient left: in chair;with call bell/phone within reach;with family/visitor present Nurse Communication: Mobility status PT Visit Diagnosis: Other abnormalities of gait and mobility (R26.89)    Time: 1355-1420 PT Time Calculation (min) (ACUTE ONLY): 25 min   Charges:   PT Evaluation $PT Eval Low Complexity: 1 Low PT Treatments $Gait Training: 8-22 mins PT General Charges $$ ACUTE PT VISIT: 1 Visit         Abigail Hoff, PT Acute Rehabilitation Services Office:[phone] 10/14/2023   Marley Simmers 10/14/2023, 2:25 PM

## 2023-10-14 NOTE — Anesthesia Procedure Notes (Signed)
 Procedure Name: Intubation Date/Time: 10/14/2023 8:17 AM  Performed by: Johann Muta, CRNAPre-anesthesia Checklist: Patient identified, Emergency Drugs available, Suction available and Patient being monitored Patient Re-evaluated:Patient Re-evaluated prior to induction Oxygen  Delivery Method: Circle System Utilized Preoxygenation: Pre-oxygenation with 100% oxygen  Induction Type: IV induction Ventilation: Mask ventilation without difficulty Laryngoscope Size: Glidescope and 3 Grade View: Grade I Tube type: Oral Tube size: 7.0 mm Number of attempts: 1 Airway Equipment and Method: Stylet and Oral airway Placement Confirmation: ETT inserted through vocal cords under direct vision, positive ETCO2 and breath sounds checked- equal and bilateral Secured at: 21 cm Tube secured with: Tape Dental Injury: Teeth and Oropharynx as per pre-operative assessment

## 2023-10-14 NOTE — TOC Initial Note (Signed)
 Transition of Care [facility]) - Initial/Assessment Note    Patient Details  Name: [name] MRN: [id number] Date of Birth: July 20, 1950  [facility] CM/SW Contact:    Jonathan Neighbor, RN Phone Number: 10/14/2023, 11:15 AM  Clinical Narrative:                  Pt is from [place] rehab. Plan is for her to return tomorrow. Per admitting coordinator she will not need a new auth if discharges back to facility tomorrow. TOC following.  Expected Discharge Plan: Skilled Nursing Facility Barriers to Discharge: Continued Medical Work up   Patient Goals and CMS Choice   CMS Medicare.gov Compare Post Acute Care list provided to:: Patient Choice offered to / list presented to : Patient      Expected Discharge Plan and Services   Discharge Planning Services: CM Consult   Living arrangements for the past 2 months: Skilled Nursing Facility                                      Prior Living Arrangements/Services Living arrangements for the past 2 months: Skilled Nursing Facility Lives with:: Facility Resident                   Activities of Daily Living   ADL Screening (condition at time of admission) Independently performs ADLs?: No Is the patient deaf or have difficulty hearing?: No Does the patient have difficulty seeing, even when wearing glasses/contacts?: No Does the patient have difficulty concentrating, remembering, or making decisions?: No  Permission Sought/Granted                  Emotional Assessment              Admission diagnosis:  Stenosis of cervical spine with myelopathy (HCC) [M48.02, G99.2] Cervical myelopathy (HCC) [G95.9] Patient Active Problem List   Diagnosis Date Noted   Cervical myelopathy (HCC) 10/14/2023   Multifocal pneumonia 06/11/2023   HCAP (healthcare-associated pneumonia)    Acute respiratory failure with hypoxia (HCC)    Weakness 02/28/2017   Degenerative spondylolisthesis 02/24/2017   SINUSITIS,  RECURRENT 04/18/2009   HYPOTHYROIDISM 02/26/2008   ANXIETY 02/26/2008   DEPRESSION 02/26/2008   CHRONIC PAIN SYNDROME 02/26/2008   HYPERTENSION 02/26/2008   HERNIA 02/26/2008   UTI'S, RECURRENT 02/26/2008   OSTEOARTHRITIS 02/26/2008   DIARRHEA 02/26/2008   URINARY INCONTINENCE 02/26/2008   LAMINECTOMY, LUMBAR, HX OF 02/26/2008   PNEUMONIA, RECURRENT 02/25/2008   PCP:  Alonso Jan., MD Pharmacy:   [facility] [phone] Georgeana Kindler, [place] - [address] AT [facility] [address] [place] [address] Phone: [phone] Fax: [phone]  [facility] [address] [place] [address] Phone: [phone] Fax: [phone]     Social Drivers of Health (SDOH) Social History: SDOH Screenings   Food Insecurity: Food Insecurity Present (06/11/2023)  Housing: Low Risk  (06/11/2023)  Transportation Needs: No Transportation Needs (06/11/2023)  Utilities: Not At Risk (06/11/2023)  Social Connections: Moderately Integrated (06/11/2023)  Tobacco Use: Medium Risk (10/14/2023)   SDOH Interventions:     Readmission Risk Interventions     No data to display

## 2023-10-14 NOTE — Transfer of Care (Signed)
 Immediate Anesthesia Transfer of Care Note  Patient: [name]  Procedure(s) Performed: ANTERIOR CERVICAL DECOMPRESSION/DISCECTOMY FUSION CERVICAL THREE-CERVICAL FOUR  Patient Location: PACU  Anesthesia Type:General  Level of Consciousness: awake  Airway & Oxygen  Therapy: Patient Spontanous Breathing and Patient connected to face mask oxygen   Post-op Assessment: Report given to RN and Post -op Vital signs reviewed and stable  Post vital signs: Reviewed and stable  Last Vitals:  Vitals Value Taken Time  BP 179/80 10/14/23 10:00  Temp    Pulse 67 10/14/23 10:03  Resp 16 10/14/23 10:03  SpO2 98 % 10/14/23 10:03  Vitals shown include unfiled device data.  Last Pain:  Vitals:   10/14/23 0709  TempSrc:   PainSc: 9          Complications: No notable events documented.

## 2023-10-15 DIAGNOSIS — M4802 Spinal stenosis, cervical region: Secondary | ICD-10-CM | POA: Diagnosis not present

## 2023-10-15 MED FILL — Thrombin For Soln 5000 Unit: CUTANEOUS | Qty: 2 | Status: AC
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
## 2023-10-15 NOTE — Discharge Instructions (Signed)

## 2023-10-15 NOTE — Discharge Summary (Signed)
 Physician Discharge Summary  Patient ID: [name] MRN: [id number] DOB/AGE: Dec 02, 1950 73 y.o.  Admit date: 10/14/2023 Discharge date: 10/15/2023  Admission Diagnoses:  Discharge Diagnoses:  Principal Problem:   Cervical myelopathy [facility])   Discharged Condition: good  Hospital Course: Patient mated to the hospital where she underwent uncomplicated C3-4 anterior cervical and fusion.  Postoperatively she is significantly improved.  She has much less pain.  Her upper extremity strength sensation and dexterity are significantly improved.  Lower extremity strength is also improved.  She is swallowing well.  Her voice is strong.  She is ready for discharge back to her skilled nursing facility where she continues to convalesce following multiple medical issues.  Consults:   Significant Diagnostic Studies:   Treatments:   Discharge Exam: Blood pressure 112/69, pulse 86, temperature 98.5 F (36.9 C), temperature source Oral, resp. rate 16, height 5' 5 (1.651 m), weight 54.4 kg, SpO2 100%. Awake and alert.  Oriented and appropriate.  Speech is fluent.  Judgment insight are intact.  Cranial nerve function normal bilateral.  Motor examination reveals 4 to 4+/5 strength in both upper extremities.  4+/5 strength in both lower extremities.  Wound clean and dry.  Chest and abdomen benign.  Disposition: Discharge disposition: 03-Skilled Nursing Facility        Allergies as of 10/15/2023       Reactions   Fentanyl  Other (See Comments)   02-28-17 Pt reports that she does not have an allergy to this medication   Oxycontin  [oxycodone  Hcl] Other (See Comments)   Hallucinations , can take percocet with no issues         Medication List     TAKE these medications    acetaminophen  500 MG tablet Commonly known as: TYLENOL  Take 2 tablets (1,000 mg total) every 8 (eight) hours as needed by mouth. What changed:  when to take this reasons to take this   baclofen  20 MG  tablet Commonly known as: LIORESAL  Take 20 mg by mouth 3 (three) times daily.   cholecalciferol 25 MCG (1000 UNIT) tablet Commonly known as: VITAMIN D3 Take 1,000 Units by mouth daily.   Daliresp  500 MCG Tabs tablet Generic drug: roflumilast  Take 500 mcg by mouth daily.   diclofenac  50 MG EC tablet Commonly known as: VOLTAREN  Take 50 mg by mouth every 6 (six) hours.   FLUoxetine  20 MG tablet Commonly known as: PROZAC  Take 60 mg by mouth daily.   gabapentin  800 MG tablet Commonly known as: NEURONTIN  Take 800 mg by mouth every 6 (six) hours.   guaifenesin  400 MG Tabs tablet Commonly known as: HUMIBID E Take 400 mg by mouth every 4 (four) hours as needed (cough).   Inulin 2 g Chew Chew 2 g by mouth daily.   levothyroxine  100 MCG tablet Commonly known as: SYNTHROID  Take 100 mcg by mouth daily.   LORazepam  0.5 MG tablet Commonly known as: ATIVAN  Take 0.5 mg by mouth 2 (two) times daily.   mirtazapine  45 MG tablet Commonly known as: REMERON  Take 45 mg by mouth at bedtime.   misoprostol  200 MCG tablet Commonly known as: CYTOTEC  Take 200 mcg by mouth 4 (four) times daily.   nebivolol  2.5 MG tablet Commonly known as: BYSTOLIC  Take 2.5 mg by mouth daily.   omeprazole 20 MG capsule Commonly known as: PRILOSEC Take 20 mg by mouth daily as needed (heartburn/indigestion).   saccharomyces boulardii 250 MG capsule Commonly known as: FLORASTOR Take 250 mg by mouth 2 (two) times daily.  Spiriva Respimat 2.5 MCG/ACT Aers Generic drug: Tiotropium Bromide Monohydrate Inhale 1 puff into the lungs daily.   Suboxone  8-2 MG Film Generic drug: Buprenorphine  HCl-Naloxone  HCl Place 1 Film under the tongue 3 (three) times daily.   tolterodine 4 MG 24 hr capsule Commonly known as: DETROL LA Take 4 mg by mouth daily.   traMADol  50 MG tablet Commonly known as: ULTRAM  Take 50 mg by mouth every 8 (eight) hours.   trimethoprim  100 MG tablet Commonly known as: TRIMPEX  Take  100 mg by mouth at bedtime.         Signed: Awilda Bogus Whitaker Holderman 10/15/2023, 8:12 AM

## 2023-10-15 NOTE — Care Management Obs Status (Signed)
 MEDICARE OBSERVATION STATUS NOTIFICATION   Patient Details  Name: [name] MRN: [id number] Date of Birth: [birth year]   Medicare Observation Status Notification Given:  Yes    Felix Host 10/15/2023, 9:39 AM

## 2023-10-15 NOTE — Plan of Care (Signed)
 Pt and daughter given D/C instructions. Report also called to Lane Pinon, nurse at [facility]. Pt's incision is clean and dry with no sign of infection. Pt's IV was removed prior to D/C. Pt D/C'd to SNF via wheelchair per MD order, daughter is transporting to facility.  Pt is stable @ D/C and has no other needs at this time. Barron Lien, RN

## 2023-10-17 ENCOUNTER — Encounter (HOSPITAL_COMMUNITY): Payer: Self-pay | Admitting: Neurosurgery

## 2023-11-05 ENCOUNTER — Telehealth: Payer: Self-pay | Admitting: Diagnostic Neuroimaging

## 2023-11-05 NOTE — Telephone Encounter (Signed)
 LVM asking pt to cb and r/s 7/15 appt- MD out.

## 2023-11-11 ENCOUNTER — Ambulatory Visit: Admitting: Diagnostic Neuroimaging
